# Patient Record
Sex: Male | Born: 1960
Health system: Southern US, Community
[De-identification: ages and names within clinical notes are randomized; demographics above are authoritative.]

## PROBLEM LIST (undated history)

## (undated) DIAGNOSIS — I10 Essential (primary) hypertension: Secondary | ICD-10-CM

## (undated) DIAGNOSIS — G8929 Other chronic pain: Secondary | ICD-10-CM

## (undated) DIAGNOSIS — M51369 Other intervertebral disc degeneration, lumbar region without mention of lumbar back pain or lower extremity pain: Secondary | ICD-10-CM

## (undated) DIAGNOSIS — Z923 Personal history of irradiation: Secondary | ICD-10-CM

## (undated) DIAGNOSIS — C801 Malignant (primary) neoplasm, unspecified: Secondary | ICD-10-CM

## (undated) DIAGNOSIS — K219 Gastro-esophageal reflux disease without esophagitis: Secondary | ICD-10-CM

## (undated) DIAGNOSIS — A0472 Enterocolitis due to Clostridium difficile, not specified as recurrent: Secondary | ICD-10-CM

## (undated) DIAGNOSIS — R112 Nausea with vomiting, unspecified: Secondary | ICD-10-CM

## (undated) DIAGNOSIS — K2270 Barrett's esophagus without dysplasia: Secondary | ICD-10-CM

## (undated) DIAGNOSIS — K5792 Diverticulitis of intestine, part unspecified, without perforation or abscess without bleeding: Secondary | ICD-10-CM

## (undated) DIAGNOSIS — M5136 Other intervertebral disc degeneration, lumbar region: Secondary | ICD-10-CM

## (undated) DIAGNOSIS — Z9889 Other specified postprocedural states: Secondary | ICD-10-CM

## (undated) DIAGNOSIS — M549 Dorsalgia, unspecified: Secondary | ICD-10-CM

## (undated) DIAGNOSIS — F419 Anxiety disorder, unspecified: Secondary | ICD-10-CM

## (undated) DIAGNOSIS — E785 Hyperlipidemia, unspecified: Secondary | ICD-10-CM

## (undated) DIAGNOSIS — E039 Hypothyroidism, unspecified: Secondary | ICD-10-CM

## (undated) DIAGNOSIS — Q394 Esophageal web: Secondary | ICD-10-CM

## (undated) HISTORY — DX: Hypothyroidism, unspecified: E03.9

## (undated) HISTORY — PX: COLONOSCOPY: SHX174

## (undated) HISTORY — DX: Barrett's esophagus without dysplasia: K22.70

## (undated) HISTORY — PX: OTHER SURGICAL HISTORY: SHX169

## (undated) HISTORY — DX: Essential (primary) hypertension: I10

## (undated) HISTORY — DX: Other chronic pain: G89.29

## (undated) HISTORY — DX: Enterocolitis due to Clostridium difficile, not specified as recurrent: A04.72

## (undated) HISTORY — DX: Dorsalgia, unspecified: M54.9

## (undated) HISTORY — PX: REMOVAL OF GASTROSTOMY TUBE: SHX6058

## (undated) HISTORY — DX: Malignant (primary) neoplasm, unspecified: C80.1

## (undated) HISTORY — PX: CHOLECYSTECTOMY: SHX55

## (undated) HISTORY — DX: Diverticulitis of intestine, part unspecified, without perforation or abscess without bleeding: K57.92

## (undated) HISTORY — PX: GASTROSTOMY TUBE CHANGE: SHX312

## (undated) HISTORY — DX: Esophageal web: Q39.4

---

## 1998-07-31 ENCOUNTER — Inpatient Hospital Stay (HOSPITAL_COMMUNITY): Admission: EM | Admit: 1998-07-31 | Discharge: 1998-08-01 | Payer: Self-pay | Admitting: Emergency Medicine

## 2002-05-29 ENCOUNTER — Encounter: Payer: Self-pay | Admitting: Family Medicine

## 2002-05-29 ENCOUNTER — Inpatient Hospital Stay (HOSPITAL_COMMUNITY): Admission: AD | Admit: 2002-05-29 | Discharge: 2002-05-31 | Payer: Self-pay | Admitting: Family Medicine

## 2003-05-21 ENCOUNTER — Ambulatory Visit (HOSPITAL_BASED_OUTPATIENT_CLINIC_OR_DEPARTMENT_OTHER): Admission: RE | Admit: 2003-05-21 | Discharge: 2003-05-21 | Payer: Self-pay | Admitting: *Deleted

## 2003-07-24 ENCOUNTER — Encounter: Payer: Self-pay | Admitting: Emergency Medicine

## 2003-07-24 ENCOUNTER — Observation Stay (HOSPITAL_COMMUNITY): Admission: EM | Admit: 2003-07-24 | Discharge: 2003-07-25 | Payer: Self-pay | Admitting: Emergency Medicine

## 2003-07-24 ENCOUNTER — Encounter: Payer: Self-pay | Admitting: Family Medicine

## 2004-10-14 ENCOUNTER — Ambulatory Visit (HOSPITAL_COMMUNITY): Admission: RE | Admit: 2004-10-14 | Discharge: 2004-10-14 | Payer: Self-pay | Admitting: Family Medicine

## 2005-04-25 ENCOUNTER — Ambulatory Visit (HOSPITAL_COMMUNITY): Admission: RE | Admit: 2005-04-25 | Discharge: 2005-04-25 | Payer: Self-pay | Admitting: Family Medicine

## 2006-07-12 ENCOUNTER — Ambulatory Visit (HOSPITAL_COMMUNITY): Admission: RE | Admit: 2006-07-12 | Discharge: 2006-07-12 | Payer: Self-pay | Admitting: Family Medicine

## 2007-07-04 ENCOUNTER — Ambulatory Visit (HOSPITAL_COMMUNITY): Admission: RE | Admit: 2007-07-04 | Discharge: 2007-07-04 | Payer: Self-pay | Admitting: Family Medicine

## 2007-07-10 ENCOUNTER — Ambulatory Visit: Payer: Self-pay | Admitting: Cardiovascular Disease

## 2007-07-24 ENCOUNTER — Ambulatory Visit (HOSPITAL_COMMUNITY): Admission: RE | Admit: 2007-07-24 | Discharge: 2007-07-24 | Payer: Self-pay | Admitting: Family Medicine

## 2008-03-10 ENCOUNTER — Ambulatory Visit (HOSPITAL_COMMUNITY): Admission: RE | Admit: 2008-03-10 | Discharge: 2008-03-10 | Payer: Self-pay | Admitting: Family Medicine

## 2009-04-01 ENCOUNTER — Ambulatory Visit (HOSPITAL_COMMUNITY): Admission: RE | Admit: 2009-04-01 | Discharge: 2009-04-01 | Payer: Self-pay | Admitting: Family Medicine

## 2009-08-05 ENCOUNTER — Ambulatory Visit (HOSPITAL_COMMUNITY): Admission: RE | Admit: 2009-08-05 | Discharge: 2009-08-05 | Payer: Self-pay | Admitting: Family Medicine

## 2009-08-26 ENCOUNTER — Ambulatory Visit (HOSPITAL_COMMUNITY): Admission: RE | Admit: 2009-08-26 | Discharge: 2009-08-26 | Payer: Self-pay | Admitting: Family Medicine

## 2010-11-04 ENCOUNTER — Ambulatory Visit (HOSPITAL_COMMUNITY): Admission: RE | Admit: 2010-11-04 | Discharge: 2010-11-04 | Payer: Self-pay | Admitting: Family Medicine

## 2011-01-16 ENCOUNTER — Encounter: Payer: Self-pay | Admitting: Family Medicine

## 2011-05-10 NOTE — Assessment & Plan Note (Signed)
[facility] HEALTHCARE                       [place] CARDIOLOGY OFFICE NOTE   NAME:[name] Best, [name] Best                      MRN:          [id number]  DATE:07/10/2007                            DOB:          1961/10/21    [name] is a delightful 50 year old patient referred for chest pain.  The patient had an episode two weeks ago.  He woke up in the morning.  He had severe substernal chest pain.  It hurt to take a deep breath.  He  then had some burning in his throat.  The pain radiated to his back and  lasted about two hours and resolved spontaneously.  He did not seek  medical advice at this time.  Two days later he saw Dr. Regino Schultze, and Dr.  Regino Schultze referred the patient here.  The patient thinks he may have had a  similar episode in 2004 related to reflux.  He had no workup at that  time.  The patient does admit to eating some steaks at about 9 o'clock  at night prior to the episode.   The pain has not recurred.  There was no associated diaphoresis or  syncope and no palpitations.   Coronary risk factors essentially include positive family history.  The  patient is a nonsmoker.  He does not have significant hypertension.  He  does not have diabetes.  Cholesterol status is unknown.   REVIEW OF SYSTEMS:  His Review of Systems is, otherwise, negative.   HOSPITALIZATIONS:  He does not have any recent hospitalizations.   ALLERGIES:  He reports having an allergy to VALIUM.   SOCIAL HISTORY:  He does not smoke or drink.   PAST SURGICAL HISTORY:  His only previous surgery involves the left  elbow done by Dr. Metro Kung for pinched nerve.   Meds: He does not take any medications routinely and takes p.r.n.  antacids.   FAMILY HISTORY:  Remarkable for father dying at age 14 of heart attack.  Mother is still alive at age 32.  The patient is happily married.  He  has an 52 year old and a 52 year old.  He works at [employer].  He tries to walk at [employer] every other day.  Otherwise, he is fairly  sedentary and has always been heavy.   PHYSICAL EXAMINATION:  GENERAL:  Remarkable for a healthy-appearing but  overweight middle-aged white male in no distress.  Affect is  appropriate.  VITAL SIGNS:  Weight 262, blood pressure 138/80, pulse 72 and regular.  HEENT:  Normal.  NECK:  Supple.  There is no thyromegaly, no lymphadenopathy.  No bruits,  no JVP elevation.  LUNGS:  Clear with good diaphragmatic motion and no wheezing.  HEART:  There is an S1, S2 with normal heart sounds.  PMI is normal.  ABDOMEN:  Benign.  Bowel sounds are positive.  No AAA, no tenderness, no  hepatosplenomegaly or hepatojugular reflux.  EXTREMITIES:  Femorals are +4 bilaterally without bruits.  Pulses are  intact with no edema.  NEUROLOGICAL:  Nonfocal.  There is no muscular weakness.  SKIN:  Warm and dry.  He has a scarf  over the left elbow from his  previous surgery.   Electrocardiogram shows sinus rhythm with T wave changes in leads III  and aVF and right superior axis deviation.   IMPRESSION:  1. Isolated episode of chest pain may be related to reflux not      recurrent.  Agree with stress Myoview testing.  2. Abnormal EKG with T wave inversions in III and aVF likely      nonspecific.  The patient having a stress test to rule out coronary      disease.  Given the fact that he has an abnormal EKG and there is      also a question of pulmonary disease pattern with right superior      axis deviation, I think he should have a 2D echocardiogram to      assess the structural integrity of his right and left ventricle.  3. Cholesterol status unknown, likely elevated.  Needs to see a      nutritionist for dietary consultation and weight loss.  4. History of reflux p.r.n. Protonix.  5. I will see the patient on a p.r.n. basis depending on the results      of a stress test and echocardiogram.     Theron Arista C. Eden Emms, MD, [facility]  Electronically Signed     PCN/MedQ  DD: 07/10/2007  DT: 07/11/2007  Job #: [id number]   cc:   Kirk Ruths, M.D.

## 2011-05-13 NOTE — H&P (Signed)
   NAMEJACOBO, [name] Best                         ACCOUNT NO.:  [id number]   MEDICAL RECORD NO.:  [id number]                   PATIENT TYPE:  INP   LOCATION:  A330                                 FACILITY:  [facility]   PHYSICIAN:  Kirk Ruths, M.D.            DATE OF BIRTH:  13-Apr-1961   DATE OF ADMISSION:  07/23/2003  DATE OF DISCHARGE:                                HISTORY & PHYSICAL   CHIEF COMPLAINT:  Back pain.   PRESENTING ILLNESS:  This is a 50 year old white male in good health who was  at home, without problems, until he bent over to pick up his son, felt a pop  in his back and fell to the floor in such severe pain that he was unable to  move. The ambulance brought him to the emergency room where he finally got  relief after multiple injections of morphine.  The patient plain films of  his LS spine showed no acute changes.  He will be admitted for pain control,  MRI, and rule out ruptured disk.   ALLERGIES:  He is allergic to TETRACYCLINE.   PAST MEDICAL HISTORY:  Otherwise has no medical problems.   REVIEW OF SYSTEMS:  Noncontributory.   PHYSICAL EXAMINATION:  GENERAL: A well-developed, white male in severe pain.  VITAL SIGNS:  He is afebrile.  Pulse 66 and regular. Respirations 20 and  unlabored.  Blood pressure 150/80.  HEENT:  TMs are normal.  Pupils are equal, round, and reacted to light and  accommodation.  Oropharynx benign.  NECK:  Supple without JVD, bruit or thyromegaly.  LUNGS:  Lungs are clear in all areas.  HEART:  With a regular sinus rhythm without murmur, gallop, or rub.  ABDOMEN:  Soft, nontender.  Lumbar spine exquisitely tender.  Straight leg  raising negative.  NEUROLOGIC: Examination grossly intact.  EXTREMITIES:  Without clubbing, cyanosis, or edema.                                               Kirk Ruths, M.D.    WMM/MEDQ  D:  07/24/2003  T:  07/24/2003  Job:  [id number]

## 2011-05-13 NOTE — Consult Note (Signed)
[facility]  Patient:    [name] Best, NESTLE Visit Number: [id number] MRN: [id number]          Service Type: MED Location: 3A A304 01 Attending Physician:  Kirk Ruths Dictated by:   Barbaraann Barthel, M.D. Proc. Date: 05/29/02 Admit Date:  05/29/2002   CC:         Karleen Hampshire, M.D.   Consultation Report  Surgery was asked this 50 year old white male for abdominal pain.  CHIEF COMPLAINT:  Hypogastric pain for approximately 24 hours.  HISTORY OF PRESENT ILLNESS:  The patient states that he was in his usual state of good health.  He works the night shift at [employer] and developed pain at approximately 11:00 at night.  He ate about 8:00 and about 11:00 he developed some severe hypogastric pain in the midline just above the symphysis pubis. This was accompanied with some loose stools that he noted later on afterwards and he had no symptoms of nausea or vomiting.  He had discomfort when he urinated, but no blood in his urine that was grossly visible and he has no past history of nephrolithiasis.  PHYSICAL EXAMINATION  VITAL SIGNS:  Temperature 98.7.  He stated earlier that his temperature was 100 when he was seen in Dr. Loyal Gambler office.  His temperature now is 98.7 with a heart rate of 78 per minute, respirations 20 per minute, blood pressure 130/84.  Height 6 feet 3 inches, weight 275.4 pounds.  HEENT:  Head is normocephalic.  Eyes:  Extraocular movements are intact. Pupils are round and react to light and accommodation.  There is no conjunctive pallor or scleral injection.  There sclerae are of normal tincture. The patient uses corrective lenses.  Ears:  No discharge, complaint of pain, or loss of hearing.  Nose and oral mucosa within normal limits.  NECK:  Supple and cylindrical without jugular venous distention, thyromegaly, tracheal deviation, or cervical adenopathy.  No bruits are auscultated.  CHEST:  Clear both to anterior and  posterior auscultation.  HEART:  Regular rhythm.  No murmurs or gallops are appreciated.  ABDOMEN:  The patients bowel sounds are present, slightly diminished.  The patient has no visceromegaly.  He is rather large and a difficult abdomen to evaluate; however, his pain is located just above the symphysis pubis and slightly perhaps to the right of this.  He has no femoral or inguinal hernias.   GENITOURINARY:  Within normal limits.  RECTAL:  Prostate is not enlarged and smooth and nontender.  I feel firm formed stools within his rectal ampulla despite his history of diarrhea.  No masses are palpated.  He has no costovertebral tenderness on percussion.  EXTREMITIES:  Within normal limits.  PAST MEDICAL HISTORY:  Unremarkable.  PAST SURGICAL HISTORY:  The patient had a laceration of his left hand.  Other than that, no past surgeries.  MEDICATIONS:  He takes no medications on a regular basis.  ALLERGIES:  No known allergies.  REVIEW OF SYSTEMS:  GASTROINTESTINAL:  Crampy hypogastric pain that has become continuous according to him.  He did have, he states, some chills and fever earlier.  He does not have any symptoms at present.  Some loose stools.  He has no history of nausea or vomiting.  No history of weight loss.  No history of change in bowel habits.  No family history of inflammatory bowel disease. No history of bright red rectal bleeding or black tarry stools. GENITOURINARY:  No history of nephrolithiasis.  No history of  CV tenderness. No history of hematuria.  ENDOCRINE:  No history of diabetes or thyroid disease.  CARDIOVASCULAR:  The patient is a nonsmoker, nondrinker.  No history of chest pain or any history of respiratory complaints.  MUSCULOSKELETAL:  The patient is rather large, but grossly normal for his size.  SOCIOECONOMIC:  The patient works for [employer] and has done so for many years.  LABORATORY DATA:  Pending.  Patient also has a CT scan  pending.  IMPRESSION:  My clinical impression is he may possibly have an appendicitis, although the location is somewhat more to the midline than usual.  He does have some pain on deep palpation of the right lower quadrant and certainly elicits some guarding there and signs of peritoneal irritation.  He may have some colitis or enteritis.  We will evaluate that as well with a CT scan.  PLAN:  He is admitted.  He is to be made n.p.o.  He is to be hydrated.  We will check the CT scan and if this is acute appendicitis, we will perform laparotomy and appendectomy as soon as possible.  We will follow him also for any acute surgical changes.  I would like to thank Dr. Regino Schultze for sending him my way. Dictated by:   Barbaraann Barthel, M.D. Attending Physician:  Kirk Ruths DD:  05/29/02 TD:  05/31/02 Job: [id number]

## 2011-05-13 NOTE — Discharge Summary (Signed)
[facility]  Patient:    [name] Best, [name] Best Visit Number: [id number] MRN: [id number]          Service Type: MED Location: 3A A304 01 Attending Physician:  Kirk Ruths Dictated by:   Karleen Hampshire, M.D. Admit Date:  05/29/2002 Discharge Date: 05/31/2002                             Discharge Summary  DISCHARGE DIAGNOSIS:  Acute diverticulitis.  HISTORY OF PRESENT ILLNESS:  This 50 year old white male presented to my office with severe lower abdominal pain x 24 hours.  He was noted to have exquisite tenderness, suprapubic and left lower quadrant.  The patient was promptly admitted and a stat CT of the abdomen was ordered.  The patient was seen in consultation by Barbaraann Barthel, M.D., for surgery.  LABORATORY DATA:  His initial white count was 16,000 and hemoglobin 14.4.  The sedimentation rate was 48.  Electrolytes were within normal range.  The urinalysis was normal.  The urine culture was negative.  CT scan showed multiple sigmoid colon diverticula, wall thickening involving the mesenteric side of the mid sigmoid colon with adjacent soft tissue stranding, and a tiny amount of adjacent extraluminal air.  The conclusion was sigmoid diverticulosis and diverticulitis with a small contained perforation without abscess.  HOSPITAL COURSE:  The patient was placed on IV Cipro and Flagyl and sips of clear liquids.  His pain subsided over the next several days.  He underwent a nutritional consult regarding diverticular disease.  His abdomen was soft.  He was tolerating a regular diet.  DISPOSITION:  He was discharged home.  DISCHARGE MEDICATIONS:  Cipro and Flagyl.  DIET:  High-residue diet.  FOLLOW-UP:  He will be followed in eight weeks, at which time we will proceed with repeat CT.  CONDITION ON DISCHARGE:  He was stable at the time of discharge. Dictated by:   Karleen Hampshire, M.D. Attending Physician:  Kirk Ruths DD:   06/17/02 TD:  06/18/02 Job: [id number]

## 2011-05-13 NOTE — H&P (Signed)
[facility]  Patient:    [name] Best, [name] Best Visit Number: [id number] MRN: [id number]          Service Type: MED Location: 3A A304 01 Attending Physician:  Kirk Ruths Dictated by:   Karleen Hampshire, M.D. Admit Date:  05/29/2002                           History and Physical  CHIEF COMPLAINT:  Abdominal pain.  HISTORY OF PRESENT ILLNESS:  This is a 50 year old white male with no significant medical problems, who presents to the office with a one day history of abdominal pain.  The patient stated that he had some diarrhea approximately 24 hours ago x1, and had gradual onset of lower abdominal pain. Last night, he had some pain with urination, as well as sweats and chills.  He is also complaining of headache and dizziness.  The patient based on his office exam appears to have a possible acute abdomen.  Will admit.  ALLERGIES:  TETRACYCLINE.  MEDICATIONS:  Currently takes Xanax p.r.n.  PAST MEDICAL HISTORY:  The patient is status post T&A.  REVIEW OF SYSTEMS:  Noncontributory, see PI.  SOCIAL HISTORY:  The patient denies smoking, alcohol, or drug use.  PHYSICAL EXAMINATION:  GENERAL:  A well-developed white male who appears in moderate distress due to abdominal pain.  VITAL SIGNS:  His temperature is 99, blood pressure 130/86, pulse 98 and regular, and respirations and unlabored.  HEENT:  TMs are normal.  Pupils equal to light and accommodation.  Oropharynx benign.  NECK:  Supple without JVD, bruits, or thyromegaly.  LUNGS:  Clear in all areas.  HEART:  With a regular sinus rhythm without murmur, gallop, or rub.  ABDOMEN:  Soft with positive bowel sounds, although diminished.  There is severe suprapubic tenderness with rebound.  RECTAL:  Prostate exam is normal and nontender.  EXTREMITIES:  Without clubbing, cyanosis, or edema.  NEUROLOGIC:  Examination grossly intact.  ASSESSMENT: 1. Acute abdomen, probable diverticulitis. 2.  Possible urinary tract infection.  PLAN:  Will admit, IV antibiotics, surgical consult, stat CT of the abdomen and pelvis. Dictated by:   Karleen Hampshire, M.D. Attending Physician:  Kirk Ruths DD:  05/29/02 TD:  05/30/02 Job: [id number]

## 2011-05-13 NOTE — Op Note (Signed)
NAME:  [name], Best                         ACCOUNT NO.:  [id number]   MEDICAL RECORD NO.:  [id number]                   PATIENT TYPE:  AMB   LOCATION:  DSC                                  FACILITY:  [facility]   PHYSICIAN:  Lowell Bouton, M.D.      DATE OF BIRTH:  10-08-1961   DATE OF PROCEDURE:  DATE OF DISCHARGE:                                 OPERATIVE REPORT   PREOPERATIVE DIAGNOSIS:  Ulnar nerve compression left elbow.   POSTOPERATIVE DIAGNOSIS:  Ulnar nerve compression left elbow.   PROCEDURE:  Subcutaneous anterior transposition ulnar nerve, left elbow.   SURGEON:  Lowell Bouton, M.D.   ANESTHESIA:  General.   FINDINGS:  The patient had a very proximal takeoff of the FCU branch.  The  nerve did not show specific area of compression, however, there was an  unusual plexus of veins overlying the nerve just proximal to the takeoff of  the FCU.   PROCEDURE IN DETAIL:  Under general anesthesia with the tourniquet on the  left arm the left arm was prepped and draped in the usual fashion and after  exsanguinating the limb the tourniquet was inflated to 250 mmHg.  A curved  incision was made in line with the ulnar nerve at the elbow.  Sharp  dissection was carried through the subcutaneous tissues and bleeding points  were coagulated.  Weitlander retractors were inserted and blunt dissection  was carried down to the ulnar nerve.  The nerve was traced from the medial  epicondyle proximally about 8 cm and released of any fascial bands.  The  nerve roots then traced from the medial epicondyle distally and there was a  leash of veins overlying the nerve.  These were carefully coagulated and  divided.  The nerve was then traced distally down into the FCU fascia and  released.  The takeoff to the FCU muscle was slightly proximal and care was  taken to protect it.  The nerve was released circumferentially and then  knife blade was used to elevate the soft tissue  off of the muscle of the  flexor forearm mass.  The nerve was then transposed anteriorly and was  positioned over the flexor muscle mass.  Flexion extension of the elbow  showed good tracking.  The tissues of the medial epicondyle were closed to  the subcutaneous tissues on the anterior flap and there was a significant  amount of room for the nerve to slide back and forth in its new position.  The wound was then irrigated copiously with saline, 4-0 Vicryl was used to  bring the flap down to the medial epicondyle.  It was then used to close the  subcutaneous layer and 3-0 Prolene was used in a subcuticular fashion in the  skin.  Steri-Strips were applied.  A vessel loop drain was left in for  drainage.  Sterile dressings were applied followed by a posterior elbow splint.  The  tourniquet  was released with good circulation to the hand.  Marcaine 0.5%  was inserted at the skin edges for pain control.  The patient went to the  recovery room awake and stable in good condition.                                               Lowell Bouton, M.D.    EMM/MEDQ  D:  05/21/2003  T:  05/21/2003  Job:  [id number]   cc:   Patrica Duel, M.D.  [address]  [place]  [place] [address]  Fax: [phone]

## 2011-07-29 ENCOUNTER — Encounter (INDEPENDENT_AMBULATORY_CARE_PROVIDER_SITE_OTHER): Payer: Self-pay | Admitting: *Deleted

## 2011-08-02 ENCOUNTER — Encounter (INDEPENDENT_AMBULATORY_CARE_PROVIDER_SITE_OTHER): Payer: Self-pay

## 2011-08-16 ENCOUNTER — Ambulatory Visit (INDEPENDENT_AMBULATORY_CARE_PROVIDER_SITE_OTHER): Payer: 59 | Admitting: Orthopedic Surgery

## 2011-08-16 ENCOUNTER — Encounter: Payer: Self-pay | Admitting: Orthopedic Surgery

## 2011-08-16 DIAGNOSIS — M2242 Chondromalacia patellae, left knee: Secondary | ICD-10-CM | POA: Insufficient documentation

## 2011-08-16 DIAGNOSIS — M2240 Chondromalacia patellae, unspecified knee: Secondary | ICD-10-CM

## 2011-08-16 NOTE — Patient Instructions (Signed)
You have received a steroid shot. 15% of patients experience increased pain at the injection site with in the next 24 hours. This is best treated with ice and tylenol extra strength 2 tabs every 8 hours. If you are still having pain please call the office.    Start Home exercises , do for 6 weeks

## 2011-08-16 NOTE — Progress Notes (Signed)
Chief complaint: left knee pain and locking HPI:(4) 49 yo male pain since Jan 2012, gradual in onset, no treatment and no injury. Describes as stabbing, 8/10 constant. Associated with standing, walking, numbness, tingling, locking and catching. The patient does recall falling onto both knees landing on his kneecaps about 6 months ago.  All of his symptoms are referred to the knee.  He does have some medial joint line pain as well.  ROS:(2) Seasonal ALLERGIES and heartburn or complaints.  Also complains of lumbar disc disease currently on prednisone  PFSH: (1)  Past Surgical History  Procedure Date  . Gallbladder surgery   . Left elbow     Past Medical History  Diagnosis Date  . Diverticulitis   . Stomach pain   . Abdominal pain, left lower quadrant   . Sprain and strain of unspecified site of knee and leg   . Chronic back pain       Physical Exam(12) GENERAL: normal development   CDV: pulses are normal   Skin: normal  Lymph: nodes were not palpable/normal  Psychiatric: awake, alert and oriented  Neuro: normal sensation  MSK LEFT knee  The patella including tenderness on the medial facet, no joint effusion.  Lyme and normal.  Range of motion normal.  Strength normal.  Knee stable.  McMurray sign negative.  Quadriceps contraction test positive.  Inferior pole patellar tenderness in the tendon.  RIGHT knee range of motion strength and stability alignment normal  Imaging: x-rays of the left knee in the office:   Assessment: AKPS    Plan: inject knee f/u prn   HEP   .xray report: 3 views LEFT knee Anterior knee pain Findings normal knee Impression normal knee   Inject Knee Knee  Injection Procedure Note  Pre-operative Diagnosis: left knee oa  Post-operative Diagnosis: same  Indications: pain  Anesthesia: ethyl chloride   Procedure Details   Verbal consent was obtained for the procedure. Time out was completed.The joint was prepped with alcohol, followed  by  Ethyl chloride spray and A 20 gauge needle was inserted into the knee via lateral approach; 4ml 1% lidocaine and 1 ml of depomedrol  was then injected into the joint . The needle was removed and the area cleansed and dressed.  Complications:  None; patient tolerated the procedure well.

## 2011-09-05 ENCOUNTER — Ambulatory Visit (INDEPENDENT_AMBULATORY_CARE_PROVIDER_SITE_OTHER): Payer: Self-pay | Admitting: Internal Medicine

## 2011-11-02 ENCOUNTER — Other Ambulatory Visit (HOSPITAL_COMMUNITY): Payer: Self-pay | Admitting: Internal Medicine

## 2011-11-02 DIAGNOSIS — K5732 Diverticulitis of large intestine without perforation or abscess without bleeding: Secondary | ICD-10-CM

## 2011-11-03 ENCOUNTER — Encounter (HOSPITAL_COMMUNITY): Payer: Self-pay

## 2011-11-03 ENCOUNTER — Ambulatory Visit (HOSPITAL_COMMUNITY)
Admission: RE | Admit: 2011-11-03 | Discharge: 2011-11-03 | Disposition: A | Discharge status: Home / Self Care | Payer: 59 | Source: Ambulatory Visit | Attending: Internal Medicine | Admitting: Internal Medicine

## 2011-11-03 DIAGNOSIS — R109 Unspecified abdominal pain: Secondary | ICD-10-CM | POA: Insufficient documentation

## 2011-11-03 DIAGNOSIS — K5732 Diverticulitis of large intestine without perforation or abscess without bleeding: Secondary | ICD-10-CM | POA: Insufficient documentation

## 2011-11-03 MED ORDER — IOHEXOL 300 MG/ML  SOLN
100.0000 mL | Freq: Once | INTRAMUSCULAR | Status: AC | PRN
  Administered 2011-11-03: 100 mL via INTRAVENOUS

## 2012-05-04 ENCOUNTER — Ambulatory Visit (HOSPITAL_COMMUNITY)
Admission: RE | Admit: 2012-05-04 | Discharge: 2012-05-04 | Disposition: A | Discharge status: Home / Self Care | Payer: 59 | Source: Ambulatory Visit | Attending: Family Medicine | Admitting: Family Medicine

## 2012-05-04 ENCOUNTER — Other Ambulatory Visit (HOSPITAL_COMMUNITY): Payer: Self-pay | Admitting: Family Medicine

## 2012-05-04 DIAGNOSIS — R079 Chest pain, unspecified: Secondary | ICD-10-CM

## 2012-05-04 DIAGNOSIS — R05 Cough: Secondary | ICD-10-CM | POA: Insufficient documentation

## 2012-05-04 DIAGNOSIS — R059 Cough, unspecified: Secondary | ICD-10-CM | POA: Insufficient documentation

## 2012-08-15 ENCOUNTER — Encounter: Payer: Self-pay | Admitting: Orthopedic Surgery

## 2012-08-15 ENCOUNTER — Ambulatory Visit: Payer: 59 | Admitting: Orthopedic Surgery

## 2012-12-26 DIAGNOSIS — A0472 Enterocolitis due to Clostridium difficile, not specified as recurrent: Secondary | ICD-10-CM

## 2012-12-26 HISTORY — DX: Enterocolitis due to Clostridium difficile, not specified as recurrent: A04.72

## 2013-04-16 NOTE — H&P (Signed)
  NTS SOAP Note  Vital Signs:  Vitals as of: 04/16/2013: Systolic 152: Diastolic 98: Heart Rate 75: Temp 97.28F: Height 70ft 3in: Weight 278Lbs 0 Ounces: BMI 34.75  BMI : 34.75 kg/m2  Subjective: This 8 Years 45 Months old Male presents for screening TCS.  Last had a colonoscopy over ten years ago.  Denies any recent GI problems.  Has had diverticulosis in the past.  No family h/o colon cancer.   Review of Symptoms:  Constitutional:unremarkable   Head:unremarkable    Eyes:unremarkable   Nose/Mouth/Throat:unremarkable Cardiovascular:  unremarkable   Respiratory:unremarkable   Gastrointestinal:  unremarkable   Genitourinary:unremarkable     Musculoskeletal:unremarkable   Skin:unremarkable Hematolgic/Lymphatic:unremarkable     Allergic/Immunologic:unremarkable     Past Medical History:    Reviewed   Past Medical History  Surgical History: cholecystectomy Medical Problems:  High cholesterol Allergies: nkda Medications: pravstatin, valium   Social History:Reviewed  Social History  Preferred Language: English Race:  White Ethnicity: Not Hispanic / Latino Age: 52 Years 0 Months Marital Status:  M Alcohol:  No Recreational drug(s):  No   Smoking Status: Never smoker reviewed on 04/16/2013 Functional Status reviewed on mm/dd/yyyy ------------------------------------------------ Bathing: Normal Cooking: Normal Dressing: Normal Driving: Normal Eating: Normal Managing Meds: Normal Oral Care: Normal Shopping: Normal Toileting: Normal Transferring: Normal Walking: Normal Cognitive Status reviewed on mm/dd/yyyy ------------------------------------------------ Attention: Normal Decision Making: Normal Language: Normal Memory: Normal Motor: Normal Perception: Normal Problem Solving: Normal Visual and Spatial: Normal   Family History:  Reviewed   Family History  Is there a family history of:No family h/o colon  cancer    Objective Information: General:  Well appearing, well nourished in no distress. Heart:  RRR, no murmur Lungs:    CTA bilaterally, no wheezes, rhonchi, rales.  Breathing unlabored. Abdomen:Soft, NT/ND, no HSM, no masses.   deferred to procedure  Assessment:Need for screening TCS  Diagnosis &amp; Procedure Smart Code   Plan:Scheduled for TCS on 05/21/13.   Patient Education:Alternative treatments to surgery were discussed with patient (and family).  Risks and benefits  of procedure were fully explained to the patient (and family) who gave informed consent. Patient/family questions were addressed.  Follow-up:Pending Surgery

## 2013-05-27 ENCOUNTER — Other Ambulatory Visit (HOSPITAL_COMMUNITY): Payer: Self-pay | Admitting: Internal Medicine

## 2013-05-27 DIAGNOSIS — K5732 Diverticulitis of large intestine without perforation or abscess without bleeding: Secondary | ICD-10-CM

## 2013-05-28 ENCOUNTER — Ambulatory Visit (HOSPITAL_COMMUNITY)
Admission: RE | Admit: 2013-05-28 | Discharge: 2013-05-28 | Disposition: A | Discharge status: Home / Self Care | Payer: 59 | Source: Ambulatory Visit | Attending: Internal Medicine | Admitting: Internal Medicine

## 2013-05-28 DIAGNOSIS — R197 Diarrhea, unspecified: Secondary | ICD-10-CM | POA: Insufficient documentation

## 2013-05-28 DIAGNOSIS — K5730 Diverticulosis of large intestine without perforation or abscess without bleeding: Secondary | ICD-10-CM | POA: Insufficient documentation

## 2013-05-28 DIAGNOSIS — K5732 Diverticulitis of large intestine without perforation or abscess without bleeding: Secondary | ICD-10-CM

## 2013-05-28 DIAGNOSIS — R1032 Left lower quadrant pain: Secondary | ICD-10-CM | POA: Insufficient documentation

## 2013-05-28 MED ORDER — IOHEXOL 300 MG/ML  SOLN
100.0000 mL | Freq: Once | INTRAMUSCULAR | Status: AC | PRN
  Administered 2013-05-28: 100 mL via INTRAVENOUS

## 2013-06-06 NOTE — H&P (Signed)
NTS SOAP Note  Vital Signs:  Vitals as of: 06/06/2013: Systolic 152: Diastolic 98: Heart Rate 75: Temp 97.19F: Height 35ft 3in: Weight 278Lbs 0 Ounces: BMI 34.75  BMI : 34.75 kg/m2  Subjective: This 52 Years 69 Months old Male presents for screening TCS.  Last had a colonoscopy over ten years ago.  Denies any recent GI problems.  Has had diverticulosis in the past.  No family h/o colon cancer.   Review of Symptoms:  Constitutional:unremarkable   Head:unremarkable    Eyes:unremarkable   Nose/Mouth/Throat:unremarkable Cardiovascular:  unremarkable   Respiratory:unremarkable   Gastrointestinal:  unremarkable   Genitourinary:unremarkable     Musculoskeletal:unremarkable   Skin:unremarkable Hematolgic/Lymphatic:unremarkable     Allergic/Immunologic:unremarkable     Past Medical History:    Reviewed   Past Medical History  Surgical History: cholecystectomy Medical Problems:  High cholesterol Allergies: nkda Medications: pravstatin, valium   Social History:Reviewed  Social History  Preferred Language: English Race:  White Ethnicity: Not Hispanic / Latino Age: 52 Years 0 Months Marital Status:  M Alcohol:  No Recreational drug(s):  No   Smoking Status: Never smoker reviewed on 04/16/2013 Functional Status reviewed on mm/dd/yyyy ------------------------------------------------ Bathing: Normal Cooking: Normal Dressing: Normal Driving: Normal Eating: Normal Managing Meds: Normal Oral Care: Normal Shopping: Normal Toileting: Normal Transferring: Normal Walking: Normal Cognitive Status reviewed on mm/dd/yyyy ------------------------------------------------ Attention: Normal Decision Making: Normal Language: Normal Memory: Normal Motor: Normal Perception: Normal Problem Solving: Normal Visual and Spatial: Normal   Family History:  Reviewed   Family History  Is there a family history of:No family h/o colon  cancer    Objective Information: General:  Well appearing, well nourished in no distress. Heart:  RRR, no murmur Lungs:    CTA bilaterally, no wheezes, rhonchi, rales.  Breathing unlabored. Abdomen:Soft, NT/ND, no HSM, no masses.   deferred to procedure  Assessment:Need for screening TCS, epigastric pain  Diagnosis &amp; Procedure Smart Code   Plan:Scheduled for EGD, TCS on 07/02/13.   Patient Education:Alternative treatments to surgery were discussed with patient (and family).  Risks and benefits  of procedure were fully explained to the patient (and family) who gave informed consent. Patient/family questions were addressed.  Follow-up:Pending Surgery

## 2013-06-17 ENCOUNTER — Encounter (HOSPITAL_COMMUNITY): Payer: Self-pay | Admitting: Pharmacy Technician

## 2013-06-18 NOTE — H&P (Signed)
  Post Operative Follow Up - 06/18/2013  Patient Name: [name] Best Date of Birth: 1961/05/24  Vital Signs:  Vitals as of: 05/30/2013: Systolic 157: Diastolic 101: Heart Rate 68: Temp 36.5C: Height 190CM: Weight 126.55KG: BMI 34.87   BMI: 34.87 kg/m2  Subjective: This 49 Years 1 Months old Male presents for followup.  Had an episode of lower abdominal pain last week, went to ER.  CT scan of abdomen showed sigmoid diverticulosis, but no diverticulitis.  Pain has resolved.  Has had intermittent episodes of lower abdominal pain and bowel urgency for many years.  Also has intermittent reflux. Patient     significant complaints.   Social History: Reviewed  Social History  Preferred Language: English Race:  White Ethnicity: Not Hispanic / Latino Age: 88 Years 0 Months Marital Status:  M Alcohol:  No Recreational drug(s):  No       Allergies:  Allergies Insert Code: No allergies found.     Objective: General: Well appearing, well nourished in no distress. Lungs:  CTA Cor:  RRR without s3, s4, murmurs Abdomen:  soft, nontender, nondistended.  No HSM, masses, hernias.   Assessment: Reflux disease, sigmoid diverticulosis, Abdominal pain  Diagnosis &amp; Procedure Smart Code    Plan: No need for acute surgical intervention.  Will add EGD to already scheduled TCS oin 07/02/13.   Follow-up:Pending Surgery

## 2013-07-02 ENCOUNTER — Encounter (HOSPITAL_COMMUNITY): Payer: Self-pay | Admitting: *Deleted

## 2013-07-02 ENCOUNTER — Ambulatory Visit (HOSPITAL_COMMUNITY)
Admission: RE | Admit: 2013-07-02 | Discharge: 2013-07-02 | Disposition: A | Discharge status: Home / Self Care | Payer: 59 | Source: Ambulatory Visit | Attending: General Surgery | Admitting: General Surgery

## 2013-07-02 ENCOUNTER — Encounter (HOSPITAL_COMMUNITY)
Admission: RE | Disposition: A | Discharge status: Home / Self Care | Payer: Self-pay | Source: Ambulatory Visit | Attending: General Surgery

## 2013-07-02 DIAGNOSIS — R109 Unspecified abdominal pain: Secondary | ICD-10-CM | POA: Insufficient documentation

## 2013-07-02 DIAGNOSIS — K5730 Diverticulosis of large intestine without perforation or abscess without bleeding: Secondary | ICD-10-CM | POA: Insufficient documentation

## 2013-07-02 DIAGNOSIS — K449 Diaphragmatic hernia without obstruction or gangrene: Secondary | ICD-10-CM | POA: Insufficient documentation

## 2013-07-02 DIAGNOSIS — D126 Benign neoplasm of colon, unspecified: Secondary | ICD-10-CM | POA: Insufficient documentation

## 2013-07-02 DIAGNOSIS — R1310 Dysphagia, unspecified: Secondary | ICD-10-CM | POA: Insufficient documentation

## 2013-07-02 DIAGNOSIS — K2940 Chronic atrophic gastritis without bleeding: Secondary | ICD-10-CM | POA: Insufficient documentation

## 2013-07-02 HISTORY — DX: Hyperlipidemia, unspecified: E78.5

## 2013-07-02 HISTORY — PX: COLONOSCOPY: SHX5424

## 2013-07-02 HISTORY — DX: Other specified postprocedural states: Z98.890

## 2013-07-02 HISTORY — PX: ESOPHAGOGASTRODUODENOSCOPY: SHX5428

## 2013-07-02 HISTORY — DX: Anxiety disorder, unspecified: F41.9

## 2013-07-02 HISTORY — DX: Other specified postprocedural states: R11.2

## 2013-07-02 SURGERY — COLONOSCOPY
Anesthesia: Moderate Sedation

## 2013-07-02 MED ORDER — SODIUM CHLORIDE 0.9 % IV SOLN
INTRAVENOUS | Status: DC
  Administered 2013-07-02: 08:00:00 via INTRAVENOUS

## 2013-07-02 MED ORDER — MIDAZOLAM HCL 5 MG/5ML IJ SOLN
INTRAMUSCULAR | Status: AC
  Filled 2013-07-02: qty 10

## 2013-07-02 MED ORDER — BUTAMBEN-TETRACAINE-BENZOCAINE 2-2-14 % EX AERO
INHALATION_SPRAY | CUTANEOUS | Status: DC | PRN
  Administered 2013-07-02: 2 via TOPICAL

## 2013-07-02 MED ORDER — MIDAZOLAM HCL 5 MG/5ML IJ SOLN
INTRAMUSCULAR | Status: DC | PRN
  Administered 2013-07-02: 4 mg via INTRAVENOUS
  Administered 2013-07-02 (×2): 1 mg via INTRAVENOUS

## 2013-07-02 MED ORDER — MEPERIDINE HCL 25 MG/ML IJ SOLN
INTRAMUSCULAR | Status: DC | PRN
  Administered 2013-07-02: 25 mg via INTRAVENOUS
  Administered 2013-07-02: 50 mg via INTRAVENOUS

## 2013-07-02 MED ORDER — OMEPRAZOLE 20 MG PO CPDR: 20.0000 mg | DELAYED_RELEASE_CAPSULE | Freq: Every day | ORAL | Status: DC

## 2013-07-02 MED ORDER — MEPERIDINE HCL 100 MG/ML IJ SOLN
INTRAMUSCULAR | Status: AC
  Filled 2013-07-02: qty 1

## 2013-07-02 NOTE — Interval H&P Note (Signed)
History and Physical Interval Note:  07/02/2013 8:25 AM  [name] Best  has presented today for surgery, with the diagnosis of screening, heartburn  The various methods of treatment have been discussed with the patient and family. After consideration of risks, benefits and other options for treatment, the patient has consented to  Procedure(s) with comments: COLONOSCOPY (N/A) - rescheduled @ 830 Pt notified per office ESOPHAGOGASTRODUODENOSCOPY (EGD) (N/A) as a surgical intervention .  The patient's history has been reviewed, patient examined, no change in status, stable for surgery.  I have reviewed the patient's chart and labs.  Questions were answered to the patient's satisfaction.     Franky Macho A

## 2013-07-02 NOTE — Op Note (Signed)
[facility] [address] [place], [address]   COLONOSCOPY PROCEDURE REPORT  PATIENT: [name] Best, [name] Best  MR#: [id number] BIRTHDATE: 1961/12/11 , 52  yrs. old GENDER: Male ENDOSCOPIST: Franky Macho, MD REFERRED XB:[id number], John PROCEDURE DATE:  07/02/2013 PROCEDURE:   Colonoscopy, diagnostic ASA CLASS:   Class II INDICATIONS:Abdominal pain. MEDICATIONS: Versed 6 mg IV and Demerol 75 mg IV  DESCRIPTION OF PROCEDURE:   After the risks benefits and alternatives of the procedure were thoroughly explained, informed consent was obtained.  A digital rectal exam revealed no abnormalities of the rectum.   The EC-[id number] ([id number]) and [id number] ([id number])  endoscope was introduced through the anus and advanced to the cecum, which was identified by both the appendix and ileocecal valve. No adverse events experienced.   The quality of the prep was adequate, using MoviPrep  The instrument was then slowly withdrawn as the colon was fully examined.      COLON FINDINGS: A sessile polyp ranging between 3-63mm in size with a mucous cap was found in the proximal sigmoid colon.  A polypectomy was performed using snare cautery.  The resection was complete and the polyp tissue was completely retrieved.   There was mild diverticulosis noted in the sigmoid colon with associated petechiae.  Retroflexed views revealed no abnormalities. The time to cecum=5 minutes 0 seconds.  Withdrawal time=8 minutes 0 seconds. The scope was withdrawn and the procedure completed. COMPLICATIONS: There were no complications.  ENDOSCOPIC IMPRESSION: 1.   Sessile polyp ranging between 3-48mm in size was found in the proximal sigmoid colon; polypectomy was performed using snare cautery 2.   There was mild diverticulosis noted in the sigmoid colon  RECOMMENDATIONS: 1.  Await pathology results 2.  Repeat Colonoscopy in 3 years. 3.  High fiber diet   eSigned:  Franky Macho, MD 07/02/2013 8:58  AM   cc:

## 2013-07-02 NOTE — Op Note (Signed)
[facility] [address] [place], [address]   ENDOSCOPY PROCEDURE REPORT  PATIENT: [name], Best  MR#: [id number] BIRTHDATE: [birth year] , 52  yrs. old GENDER: Male ENDOSCOPIST: Franky Macho, MD REFERRED BY:  Assunta Found PROCEDURE DATE:  07/02/2013 PROCEDURE:  EGD w/ biopsy for H.pylori ASA CLASS:     Class II INDICATIONS:  Dysphagia. MEDICATIONS: Versed 6 mg IV and Demerol 75 mg IV TOPICAL ANESTHETIC: Cetacaine Spray  DESCRIPTION OF PROCEDURE: After the risks benefits and alternatives of the procedure were thoroughly explained, informed consent was obtained.  The    endoscope was introduced through the mouth and advanced to the second portion of the duodenum. Without limitations.  The instrument was slowly withdrawn as the mucosa was fully examined.        ESOPHAGUS: The mucosa of the esophagus appeared normal.   A 4 cm hiatal hernia was noted.  STOMACH: Mild chronic gastritis (inflammation) was found in the gastric antrum.  A biopsy was performed using cold forceps.  Sample obtained for helicobacter pylori testing.  DUODENUM: The duodenal mucosa showed no abnormalities in the bulb and second portion of the duodenum.  Retroflexed views revealed a hiatal hernia.     The scope was then withdrawn from the patient and the procedure completed.  COMPLICATIONS: There were no complications. ENDOSCOPIC IMPRESSION: 1.   The mucosa of the esophagus appeared normal 2.   4 cm hiatal hernia 3.   Chronic gastritis (inflammation) was found in the gastric antrum; biopsy 4.   The duodenal mucosa showed no abnormalities in the bulb and second portion of the duodenum  RECOMMENDATIONS: Await pathology results  REPEAT EXAM:  eSigned:  Franky Macho, MD 07/02/2013 9:03 AM   CC:

## 2013-07-04 ENCOUNTER — Encounter (HOSPITAL_COMMUNITY): Payer: Self-pay | Admitting: General Surgery

## 2013-07-19 ENCOUNTER — Encounter: Payer: Self-pay | Admitting: Gastroenterology

## 2013-07-20 ENCOUNTER — Emergency Department (HOSPITAL_COMMUNITY): Payer: 59

## 2013-07-20 ENCOUNTER — Encounter (HOSPITAL_COMMUNITY): Payer: Self-pay | Admitting: *Deleted

## 2013-07-20 ENCOUNTER — Emergency Department (HOSPITAL_COMMUNITY)
Admission: EM | Admit: 2013-07-20 | Discharge: 2013-07-20 | Disposition: A | Discharge status: Home / Self Care | Payer: 59 | Attending: Emergency Medicine | Admitting: Emergency Medicine

## 2013-07-20 DIAGNOSIS — E785 Hyperlipidemia, unspecified: Secondary | ICD-10-CM | POA: Insufficient documentation

## 2013-07-20 DIAGNOSIS — G8929 Other chronic pain: Secondary | ICD-10-CM | POA: Insufficient documentation

## 2013-07-20 DIAGNOSIS — F411 Generalized anxiety disorder: Secondary | ICD-10-CM | POA: Insufficient documentation

## 2013-07-20 DIAGNOSIS — M549 Dorsalgia, unspecified: Secondary | ICD-10-CM | POA: Insufficient documentation

## 2013-07-20 DIAGNOSIS — Z9889 Other specified postprocedural states: Secondary | ICD-10-CM | POA: Insufficient documentation

## 2013-07-20 DIAGNOSIS — Z79899 Other long term (current) drug therapy: Secondary | ICD-10-CM | POA: Insufficient documentation

## 2013-07-20 DIAGNOSIS — R109 Unspecified abdominal pain: Secondary | ICD-10-CM | POA: Insufficient documentation

## 2013-07-20 DIAGNOSIS — R059 Cough, unspecified: Secondary | ICD-10-CM | POA: Insufficient documentation

## 2013-07-20 DIAGNOSIS — R197 Diarrhea, unspecified: Secondary | ICD-10-CM | POA: Insufficient documentation

## 2013-07-20 DIAGNOSIS — R110 Nausea: Secondary | ICD-10-CM | POA: Insufficient documentation

## 2013-07-20 DIAGNOSIS — K5732 Diverticulitis of large intestine without perforation or abscess without bleeding: Secondary | ICD-10-CM | POA: Insufficient documentation

## 2013-07-20 DIAGNOSIS — R05 Cough: Secondary | ICD-10-CM | POA: Insufficient documentation

## 2013-07-20 LAB — URINALYSIS, ROUTINE W REFLEX MICROSCOPIC
Bilirubin Urine: NEGATIVE
Leukocytes, UA: NEGATIVE
Nitrite: NEGATIVE
Specific Gravity, Urine: 1.015 (ref 1.005–1.030)
Urobilinogen, UA: 0.2 mg/dL (ref 0.0–1.0)
pH: 5.5 (ref 5.0–8.0)

## 2013-07-20 LAB — CBC WITH DIFFERENTIAL/PLATELET
Basophils Absolute: 0.1 10*3/uL (ref 0.0–0.1)
Eosinophils Absolute: 0.2 10*3/uL (ref 0.0–0.7)
Eosinophils Relative: 2 % (ref 0–5)
MCH: 29.1 pg (ref 26.0–34.0)
MCHC: 33.3 g/dL (ref 30.0–36.0)
MCV: 87.6 fL (ref 78.0–100.0)
Platelets: 205 10*3/uL (ref 150–400)
RDW: 13.8 % (ref 11.5–15.5)

## 2013-07-20 LAB — URINE MICROSCOPIC-ADD ON

## 2013-07-20 LAB — BASIC METABOLIC PANEL
Calcium: 9.4 mg/dL (ref 8.4–10.5)
GFR calc non Af Amer: 73 mL/min — ABNORMAL LOW (ref >90)
Glucose, Bld: 100 mg/dL — ABNORMAL HIGH (ref 70–99)
Sodium: 139 mEq/L (ref 135–145)

## 2013-07-20 MED ORDER — IOHEXOL 300 MG/ML  SOLN
50.0000 mL | Freq: Once | INTRAMUSCULAR | Status: AC | PRN
  Administered 2013-07-20: 50 mL via ORAL

## 2013-07-20 MED ORDER — IOHEXOL 300 MG/ML  SOLN
100.0000 mL | Freq: Once | INTRAMUSCULAR | Status: AC | PRN
  Administered 2013-07-20: 100 mL via INTRAVENOUS

## 2013-07-20 NOTE — ED Notes (Signed)
Pt aspirated to restroom , NAD noted

## 2013-07-20 NOTE — ED Notes (Addendum)
Pt c/o abd pain, nausea, diarrhea, pressure that started Wednesday, was seen by pcp on Thursday, given cipro and flagyl with no improvement in symptoms, has hx of diverticulitis. Pt sent to er this am from pcp office for further evaluation,

## 2013-07-20 NOTE — ED Provider Notes (Signed)
CSN: [id number]     Arrival date & time 07/20/13  9147 History  This chart was scribed for [name] Cooper III, MD by Bennett Scrape, ED Scribe. This patient was seen in room APA07/APA07 and the patient's care was started at 9:50 AM.   First MD Initiated Contact with Patient 07/20/13 [phone]     Chief Complaint  Patient presents with  . Abdominal Pain    The history is provided by the patient. No language interpreter was used.    HPI Comments: [name] Best is a 52 y.o. male who presents to the Emergency Department complaining of 3 days of LLQ abdominal pain with associated nausea attributed to diverticulitis. He admits that the trigger for this episode was "[place] food" consumed the night before the onset. He describes that pain as a waxing and waning sharp feeling that is significantly worsened by urination. He was seen by his PCP 2 days ago and started on Cipro, Flagyl and hydrocodone. He denies improvement since starting the antibiotics and pain medication and states that he has since developed diarrhea. He denies fever. He denies any prior diverticulitis surgeries. He reports that prior episodes have improved with 5 to 6 days of antibiotics. Last admission for the same was in 2008.  He has a h/o HLD which is treated by pravastatin. Pt denies smoking and alcohol use. Pt had a colonoscopy on 07/02/13 that showed a hiatal hernia and polyps.   Past Medical History  Diagnosis Date  . Diverticulitis   . Stomach pain   . Abdominal pain, left lower quadrant   . Chronic back pain   . Hyperlipidemia   . Anxiety   . PONV (postoperative nausea and vomiting)    Past Surgical History  Procedure Laterality Date  . Gallbladder surgery    . Left elbow    . Colonoscopy N/A 07/02/2013    Procedure: COLONOSCOPY;  Surgeon: Dalia Heading, MD;  Location: [facility];  Service: Gastroenterology;  Laterality: N/A;  rescheduled @ 830 Pt notified per office  . Esophagogastroduodenoscopy N/A 07/02/2013   Procedure: ESOPHAGOGASTRODUODENOSCOPY (EGD);  Surgeon: Dalia Heading, MD;  Location: [facility];  Service: Gastroenterology;  Laterality: N/A;   Family History  Problem Relation Age of Onset  . Heart disease    . Colon cancer Neg Hx   . Colon polyps Neg Hx    History  Substance Use Topics  . Smoking status: Never Smoker   . Smokeless tobacco: Not on file  . Alcohol Use: No    Review of Systems  Constitutional: Negative for fever.  HENT: Negative for ear pain and sore throat.   Eyes: Negative for visual disturbance.  Respiratory: Positive for cough. Negative for shortness of breath.   Cardiovascular: Negative for chest pain and palpitations.  Gastrointestinal: Positive for nausea, abdominal pain and diarrhea. Negative for vomiting.  Skin: Negative for rash.  Neurological: Negative for seizures and syncope.  All other systems reviewed and are negative.    Allergies  Tetracyclines & related-confirmed by pt at bedside   Home Medications   Current Outpatient Rx  Name  Route  Sig  Dispense  Refill  . ciprofloxacin (CIPRO) 500 MG tablet   Oral   Take 500 mg by mouth 2 (two) times daily.           . diazepam (VALIUM) 10 MG tablet   Oral   Take 10 mg by mouth every 6 (six) hours as needed for anxiety.          Marland Kitchen  diclofenac (VOLTAREN) 75 MG EC tablet   Oral   Take 75 mg by mouth 2 (two) times daily.         . diphenoxylate-atropine (LOMOTIL) 2.5-0.025 MG per tablet   Oral   Take 1 tablet by mouth 4 (four) times daily as needed for diarrhea or loose stools.         . metroNIDAZOLE (FLAGYL) 500 MG tablet   Oral   Take 500 mg by mouth 3 (three) times daily.           Marland Kitchen omeprazole (PRILOSEC) 20 MG capsule   Oral   Take 1 capsule (20 mg total) by mouth daily.   90 capsule   2   . pravastatin (PRAVACHOL) 20 MG tablet   Oral   Take 20 mg by mouth daily.          Triage Vitals: BP 152/88  Pulse 62  Temp(Src) 97.9 F (36.6 C)  Ht 6\' 3"  (1.905 m)  Wt  267 lb (121.11 kg)  BMI 33.37 kg/m2  SpO2 98%  Physical Exam  Nursing note and vitals reviewed. Constitutional: He is oriented to person, place, and time. He appears well-developed and well-nourished. No distress.  HENT:  Head: Normocephalic and atraumatic.  Mouth/Throat: Oropharynx is clear and moist.  Eyes: Pupils are equal, round, and reactive to light.  Neck: Neck supple. No tracheal deviation present.  Cardiovascular: Normal rate and regular rhythm.   Pulmonary/Chest: Effort normal and breath sounds normal. No respiratory distress.  Abdominal: Soft. He exhibits no distension and no mass. There is tenderness (mild left suprapubic tenderness). There is no rebound.  Musculoskeletal: Normal range of motion. He exhibits no edema.  Neurological: He is alert and oriented to person, place, and time.  Skin: Skin is warm and dry.  Psychiatric: He has a normal mood and affect. His behavior is normal.    ED Course   Procedures (including critical care time)  DIAGNOSTIC STUDIES: Oxygen Saturation is 98% on room air, normal by my interpretation.    COORDINATION OF CARE: 10:08 AM-Discussed treatment plan which includes CT of abdomen, CBC panel, BMP and UA with pt at bedside and pt agreed to plan. Pt declined pain medications.   Results for orders placed during the hospital encounter of 07/20/13  CBC WITH DIFFERENTIAL      Result Value Range   WBC 7.5  4.0 - 10.5 K/uL   RBC 5.39  4.22 - 5.81 MIL/uL   Hemoglobin 15.7  13.0 - 17.0 g/dL   HCT 40.9  81.1 - 91.4 %   MCV 87.6  78.0 - 100.0 fL   MCH 29.1  26.0 - 34.0 pg   MCHC 33.3  30.0 - 36.0 g/dL   RDW 78.2  95.6 - 21.3 %   Platelets 205  150 - 400 K/uL   Neutrophils Relative % 71  43 - 77 %   Neutro Abs 5.3  1.7 - 7.7 K/uL   Lymphocytes Relative 17  12 - 46 %   Lymphs Abs 1.3  0.7 - 4.0 K/uL   Monocytes Relative 8  3 - 12 %   Monocytes Absolute 0.6  0.1 - 1.0 K/uL   Eosinophils Relative 2  0 - 5 %   Eosinophils Absolute 0.2  0.0 -  0.7 K/uL   Basophils Relative 1  0 - 1 %   Basophils Absolute 0.1  0.0 - 0.1 K/uL  BASIC METABOLIC PANEL      Result Value Range  Sodium 139  135 - 145 mEq/L   Potassium 4.1  3.5 - 5.1 mEq/L   Chloride 106  96 - 112 mEq/L   CO2 26  19 - 32 mEq/L   Glucose, Bld 100 (*) 70 - 99 mg/dL   BUN 19  6 - 23 mg/dL   Creatinine, Ser 1.61  0.50 - 1.35 mg/dL   Calcium 9.4  8.4 - 09.6 mg/dL   GFR calc non Af Amer 73 (*) >90 mL/min   GFR calc Af Amer 84 (*) >90 mL/min  URINALYSIS, ROUTINE W REFLEX MICROSCOPIC      Result Value Range   Color, Urine YELLOW  YELLOW   APPearance CLEAR  CLEAR   Specific Gravity, Urine 1.015  1.005 - 1.030   pH 5.5  5.0 - 8.0   Glucose, UA NEGATIVE  NEGATIVE mg/dL   Hgb urine dipstick MODERATE (*) NEGATIVE   Bilirubin Urine NEGATIVE  NEGATIVE   Ketones, ur NEGATIVE  NEGATIVE mg/dL   Protein, ur NEGATIVE  NEGATIVE mg/dL   Urobilinogen, UA 0.2  0.0 - 1.0 mg/dL   Nitrite NEGATIVE  NEGATIVE   Leukocytes, UA NEGATIVE  NEGATIVE  URINE MICROSCOPIC-ADD ON      Result Value Range   Squamous Epithelial / LPF RARE  RARE   WBC, UA 3-6  <3 WBC/hpf   RBC / HPF 3-6  <3 RBC/hpf   Bacteria, UA RARE  RARE   Ct Abdomen Pelvis W Contrast  07/20/2013   *RADIOLOGY REPORT*  Clinical Data: Lower quadrant pain  CT ABDOMEN AND PELVIS WITH CONTRAST  Technique:  Multidetector CT imaging of the abdomen and pelvis was performed following the standard protocol during bolus administration of intravenous contrast.  Contrast: 50mL OMNIPAQUE IOHEXOL 300 MG/ML  SOLN, OMNIPAQUE IOHEXOL 300 MG/ML  SOLN  Comparison: 05/28/2013  Findings: Post cholecystectomy  Diffuse hepatic steatosis.  Spleen, pancreas, adrenal glands, and right kidney are within normal limits.  Simple cyst in the left kidney.  Diverticulosis of the sigmoid colon is present without evidence of acute diverticulitis.  Moderate stool burden in the mid colon.  Mild atherosclerotic changes of the aorta and iliac vasculature  Bladder is  mildly distended.  Unremarkable prostate gland  No free fluid.  No abnormal adenopathy.  Left inguinal hernia containing adipose tissue.  Mild lumbar degenerative disc disease.  No vertebral compression deformity.  IMPRESSION: No acute intra-abdominal or intrapelvic pathology.  Sigmoid diverticulosis.   Original Report Authenticated By: Jolaine Click, M.D.   Course in ED:  Pt had history obtained and physical examination performed.  Lab workup was benign, and CT of abomen/pelvis did not show diverticulitis.  His clinical Dx is diverticulitis, and he was advised to finish out his prescriptions for Cipro and Flagyl, and to followup in the office with Dr. Assunta Found, his primary care physician.   1. Abdominal  pain, other specified site   2. Diverticulitis large intestine     I personally performed the services described in this documentation, which was scribed in my presence. The recorded information has been reviewed and is accurate.  Osvaldo Human, M.D.     [name] Cooper III, MD 07/20/13 [phone]

## 2013-08-09 ENCOUNTER — Encounter: Payer: Self-pay | Admitting: Gastroenterology

## 2013-08-09 ENCOUNTER — Ambulatory Visit (INDEPENDENT_AMBULATORY_CARE_PROVIDER_SITE_OTHER): Payer: 59 | Admitting: Gastroenterology

## 2013-08-09 VITALS — BP 152/90 | HR 67 | Temp 98.4°F | Ht 75.0 in | Wt 264.8 lb

## 2013-08-09 DIAGNOSIS — K5790 Diverticulosis of intestine, part unspecified, without perforation or abscess without bleeding: Secondary | ICD-10-CM | POA: Insufficient documentation

## 2013-08-09 DIAGNOSIS — R1032 Left lower quadrant pain: Secondary | ICD-10-CM

## 2013-08-09 DIAGNOSIS — K5730 Diverticulosis of large intestine without perforation or abscess without bleeding: Secondary | ICD-10-CM

## 2013-08-09 MED ORDER — HYOSCYAMINE SULFATE ER 0.375 MG PO TB12: 0.3750 mg | ORAL_TABLET | Freq: Two times a day (BID) | ORAL | Status: DC | PRN

## 2013-08-09 NOTE — Assessment & Plan Note (Addendum)
52 year old gentleman with history of recurrent left lower quadrant abdominal pain associated with diarrhea. Initially, in 2003 with onset of symptoms, had documented diverticulitis requiring hospitalization. 6 subsequent CT scans have all been negative for acute diverticulitis. Patient has had worsening symptoms over the past 3 months. He describes taking Cipro and Flagyl 3 times with short term relief of the symptoms. Colonoscopy in July by Dr. Lovell Sheehan as outlined above. According to patient, there was no discussion about partial colectomy for recurrent diverticulitis.  Discussed with patient today. It would likely be difficult to convince a surgeon to consider partial colectomy given 6 negative CT scans. At this point, we will treat him with antispasmotic to see if we can decreased his pain and stool frequency. ?underlying IBS. Start Levbid. Discussed diet for diverticulosis/diverticulitis. He will call with his next episode.   Right now his diarrhea has resolved. If he has recurrent diarrhea, consider C.Diff PCR.  Office visit with Dr. Jena Gauss in six weeks.

## 2013-08-09 NOTE — Progress Notes (Signed)
Primary Care Physician:  Colette Ribas, MD  Primary Gastroenterologist:  Roetta Sessions, MD   Chief Complaint  Patient presents with  . Abdominal Pain    HPI:  [name] Best is a 52 y.o. male here for further evaluation of abdominal pain. He gives a history of recurrent diverticulitis which is been difficult to manage this year. First episode of diverticulitis back in 2003, was hospitalized for several days. CT at that time showed sigmoid diverticulitis with a small contained perforation but no abscess. Patient has had multiple bouts of similar pain throughout the years requiring repeated CT scans and/or emergency department visits. He has received Cipro and Flagyl on multiple occasions, at least 3 times in the past 3 months. States usually within 2-3 days on the antibiotic he starts to feel better. Interestingly he always has diarrhea with these episodes, about 25% of patient will have change in bowels with acute diverticulitis.  This week is the best week so far in the past couple of months.  Also seemed to do well the week after his colonoscopy by Dr. Lovell Sheehan. Always a little dull pain in LLQ. Usually solid BM twice per day. During episodes, 5-10 stools per day. Usually pain severe, unable to work. We'll have increased abdominal pain when he tries to urinate as well. Denies history of kidney stones or blood in the urine. No recent brbpr. No heartburn on prilosec. Diclofenac twice per day since 03/2013 for plantar fascitis.    He notes that things he usually tolerates sometimes seemed to be the cause of another episode. Lately he has been consuming creamed potatoes and beans. Avoids dairy products chronically. Cannot tolerate salads. Notes when he takes diclofenac, his stomach just doesn't feel right.  Colonoscopy and upper endoscopy by Dr. Lovell Sheehan last month showed mild diverticulosis, small tubular adenoma removed from the colon, gastritis (CLOtest negative).  CTs of the abdomen and  pelvis in January 2014, June 2014, November 2012, November 2011, September 2010, August 2010 with NO evidence of acute diverticulitis.    Current Outpatient Prescriptions  Medication Sig Dispense Refill  . diazepam (VALIUM) 10 MG tablet Take 10 mg by mouth every 6 (six) hours as needed for anxiety.       . diclofenac (VOLTAREN) 75 MG EC tablet Take 75 mg by mouth 2 (two) times daily.      . diphenoxylate-atropine (LOMOTIL) 2.5-0.025 MG per tablet Take 1 tablet by mouth 4 (four) times daily as needed for diarrhea or loose stools.      Marland Kitchen omeprazole (PRILOSEC) 20 MG capsule Take 1 capsule (20 mg total) by mouth daily.  90 capsule  2  . pravastatin (PRAVACHOL) 20 MG tablet Take 20 mg by mouth daily.       No current facility-administered medications for this visit.    Allergies as of 08/09/2013 - Review Complete 08/09/2013  Allergen Reaction Noted  . Tetracyclines & related Hives 08/16/2011    Past Medical History  Diagnosis Date  . Diverticulitis     hospitalized in 2003  . Stomach pain   . Abdominal pain, left lower quadrant   . Chronic back pain   . Hyperlipidemia   . Anxiety   . PONV (postoperative nausea and vomiting)     Past Surgical History  Procedure Laterality Date  . Gallbladder surgery    . Left elbow    . Colonoscopy N/A 07/02/2013    Jenkins:Sessile polyp (tubular adenoma) ranging between 3-40mm in size in the proximal sigmoid colon/mild diverticulosis   .  Esophagogastroduodenoscopy N/A 07/02/2013    Jenkins:4 cm hiatal hernia/esophagus appeared normal/chronic gastritis. Clotest negative.  . Colonoscopy      Dr. Karilyn Cota: prior to 2003 per patient    Family History  Problem Relation Age of Onset  . Heart disease    . Colon cancer Neg Hx   . Colon polyps Neg Hx     History   Social History  . Marital Status: Married    Spouse Name: N/A    Number of Children: 2  . Years of Education: 12th grade   Occupational History  . 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 Smoking status: Never Smoker   . Smokeless tobacco: Not on file  . Alcohol Use: No  . Drug Use: No  . Sexual Activity: Not on file   Other Topics Concern  . Not on file   Social History Narrative  . No narrative on file      ROS:  General: Negative for anorexia, weight loss, fever, chills, fatigue, weakness. Eyes: Negative for vision changes.  ENT: Negative for hoarseness, difficulty swallowing , nasal congestion. CV: Negative for chest pain, angina, palpitations, dyspnea on exertion, peripheral edema.  Respiratory: Negative for dyspnea at rest, dyspnea on exertion, cough, sputum, wheezing.  GI: See history of present illness. GU:  Negative for dysuria, hematuria, urinary incontinence, urinary frequency, nocturnal urination.  MS: Negative for joint pain, low back pain.  Derm: Negative for rash or itching.  Neuro: Negative for weakness, abnormal sensation, seizure, frequent headaches, memory loss, confusion.  Psych: Negative for anxiety, depression, suicidal ideation, hallucinations.  Endo: Negative for unusual weight change.  Heme: Negative for bruising or bleeding. Allergy: Negative for rash or hives.    Physical Examination:  BP 152/90  Pulse 67  Temp(Src) 98.4 F (36.9 C) (Oral)  Ht 6\' 3"  (1.905 m)  Wt 264 lb 12.8 oz (120.112 kg)  BMI 33.1 kg/m2   General: Well-nourished, well-developed in no acute distress.  Head: Normocephalic, atraumatic.   Eyes: Conjunctiva pink, no icterus. Mouth: Oropharyngeal mucosa moist and pink , no lesions erythema or exudate. Neck: Supple without thyromegaly, masses, or lymphadenopathy.  Lungs: Clear to auscultation bilaterally.  Heart: Regular rate and rhythm, no murmurs rubs or gallops.  Abdomen: Bowel sounds are normal, minimal LLQ tenderness, nondistended, no hepatosplenomegaly or masses, no abdominal bruits or    hernia , no rebound or guarding.   Rectal: not performed Extremities: No  lower extremity edema. No clubbing or deformities.  Neuro: Alert and oriented x 4 , grossly normal neurologically.  Skin: Warm and dry, no rash or jaundice.   Psych: Alert and cooperative, normal mood and affect.  Labs: Lab Results  Component Value Date   WBC 7.5 07/20/2013   HGB 15.7 07/20/2013   HCT 47.2 07/20/2013   MCV 87.6 07/20/2013   PLT 205 07/20/2013   Lab Results  Component Value Date   CREATININE 1.12 07/20/2013   BUN 19 07/20/2013   NA 139 07/20/2013   K 4.1 07/20/2013   CL 106 07/20/2013   CO2 26 07/20/2013   Clotest negative 06/2013.  Imaging Studies: Ct Abdomen Pelvis W Contrast  07/20/2013   *RADIOLOGY REPORT*  Clinical Data: Lower quadrant pain  CT ABDOMEN AND PELVIS WITH CONTRAST  Technique:  Multidetector CT imaging of the abdomen and pelvis was performed following the standard protocol during bolus administration of intravenous contrast.  Contrast: 50mL OMNIPAQUE IOHEXOL 300 MG/ML  SOLN, OMNIPAQUE IOHEXOL  300 MG/ML  SOLN  Comparison: 05/28/2013  Findings: Post cholecystectomy  Diffuse hepatic steatosis.  Spleen, pancreas, adrenal glands, and right kidney are within normal limits.  Simple cyst in the left kidney.  Diverticulosis of the sigmoid colon is present without evidence of acute diverticulitis.  Moderate stool burden in the mid colon.  Mild atherosclerotic changes of the aorta and iliac vasculature  Bladder is mildly distended.  Unremarkable prostate gland  No free fluid.  No abnormal adenopathy.  Left inguinal hernia containing adipose tissue.  Mild lumbar degenerative disc disease.  No vertebral compression deformity.  IMPRESSION: No acute intra-abdominal or intrapelvic pathology.  Sigmoid diverticulosis.   Original Report Authenticated By: Jolaine Click, M.D.

## 2013-08-09 NOTE — Patient Instructions (Addendum)
1. Try Levbid one pill twice a day for abdominal pain, loose stool. Prescription sent to your pharmacy. 2. Call with next episode of abdominal pain, loose stool. 3. Office visit in six weeks.  Diverticulosis Diverticulosis is a common condition that develops when small pouches (diverticula) form in the wall of the colon. The risk of diverticulosis increases with age. It happens more often in people who eat a low-fiber diet. Most individuals with diverticulosis have no symptoms. Those individuals with symptoms usually experience abdominal pain, constipation, or loose stools (diarrhea). HOME CARE INSTRUCTIONS   Increase the amount of fiber in your diet as directed by your caregiver or dietician. This may reduce symptoms of diverticulosis.  Your caregiver may recommend taking a dietary fiber supplement.  Drink at least 6 to 8 glasses of water each day to prevent constipation.  Try not to strain when you have a bowel movement.  Your caregiver may recommend avoiding nuts and seeds to prevent complications, although this is still an uncertain benefit.  Only take over-the-counter or prescription medicines for pain, discomfort, or fever as directed by your caregiver. FOODS WITH HIGH FIBER CONTENT INCLUDE:  Fruits. Apple, peach, pear, tangerine, raisins, prunes.  Vegetables. Brussels sprouts, asparagus, broccoli, cabbage, carrot, cauliflower, romaine lettuce, spinach, summer squash, tomato, winter squash, zucchini.  Starchy Vegetables. Baked beans, kidney beans, lima beans, split peas, lentils, potatoes (with skin).  Grains. Whole wheat bread, brown rice, bran flake cereal, plain oatmeal, white rice, shredded wheat, bran muffins. SEEK IMMEDIATE MEDICAL CARE IF:   You develop increasing pain or severe bloating.  You have an oral temperature above 102 F (38.9 C), not controlled by medicine.  You develop vomiting or bowel movements that are bloody or black. Document Released: 09/08/2004  Document Revised: 03/05/2012 Document Reviewed: 05/12/2010 [facility] Patient Information 2014 [place], [place].  Diverticulitis A diverticulum is a small pouch or sac on the colon. Diverticulosis is the presence of these diverticula on the colon. Diverticulitis is the irritation (inflammation) or infection of diverticula. CAUSES  The colon and its diverticula contain bacteria. If food particles block the tiny opening to a diverticulum, the bacteria inside can grow and cause an increase in pressure. This leads to infection and inflammation and is called diverticulitis. SYMPTOMS   Abdominal pain and tenderness. Usually, the pain is located on the left side of your abdomen. However, it could be located elsewhere.  Fever.  Bloating.  Feeling sick to your stomach (nausea).  Throwing up (vomiting).  Abnormal stools. DIAGNOSIS  Your caregiver will take a history and perform a physical exam. Since many things can cause abdominal pain, other tests may be necessary. Tests may include:  Blood tests.  Urine tests.  X-ray of the abdomen.  CT scan of the abdomen. Sometimes, surgery is needed to determine if diverticulitis or other conditions are causing your symptoms. TREATMENT  Most of the time, you can be treated without surgery. Treatment includes:  Resting the bowels by only having liquids for a few days. As you improve, you will need to eat a low-fiber diet.  Intravenous (IV) fluids if you are losing body fluids (dehydrated).  Antibiotic medicines that treat infections may be given.  Pain and nausea medicine, if needed.  Surgery if the inflamed diverticulum has burst. HOME CARE INSTRUCTIONS   Try a clear liquid diet (broth, tea, or water for as long as directed by your caregiver). You may then gradually begin a low-fiber diet as tolerated.  A low-fiber diet is a diet with less  than 10 grams of fiber. Choose the foods below to reduce fiber in the diet:  White breads, cereals,  rice, and pasta.  Cooked fruits and vegetables or soft fresh fruits and vegetables without the skin.  Ground or well-cooked tender beef, ham, veal, lamb, pork, or poultry.  Eggs and seafood.  After your diverticulitis symptoms have improved, your caregiver may put you on a high-fiber diet. A high-fiber diet includes 14 grams of fiber for every 1000 calories consumed. For a standard 2000 calorie diet, you would need 28 grams of fiber. Follow these diet guidelines to help you increase the fiber in your diet. It is important to slowly increase the amount fiber in your diet to avoid gas, constipation, and bloating.  Choose whole-grain breads, cereals, pasta, and brown rice.  Choose fresh fruits and vegetables with the skin on. Do not overcook vegetables because the more vegetables are cooked, the more fiber is lost.  Choose more nuts, seeds, legumes, dried peas, beans, and lentils.  Look for food products that have greater than 3 grams of fiber per serving on the Nutrition Facts label.  Take all medicine as directed by your caregiver.  If your caregiver has given you a follow-up appointment, it is very important that you go. Not going could result in lasting (chronic) or permanent injury, pain, and disability. If there is any problem keeping the appointment, call to reschedule. SEEK MEDICAL CARE IF:   Your pain does not improve.  You have a hard time advancing your diet beyond clear liquids.  Your bowel movements do not return to normal. SEEK IMMEDIATE MEDICAL CARE IF:   Your pain becomes worse.  You have an oral temperature above 102 F (38.9 C), not controlled by medicine.  You have repeated vomiting.  You have bloody or black, tarry stools.  Symptoms that brought you to your caregiver become worse or are not getting better. MAKE SURE YOU:   Understand these instructions.  Will watch your condition.  Will get help right away if you are not doing well or get  worse. Document Released: 09/21/2005 Document Revised: 03/05/2012 Document Reviewed: 01/17/2011 [facility] Patient Information 2014 [place], [place].

## 2013-08-12 NOTE — Progress Notes (Signed)
CC'd to PCP 

## 2013-09-19 ENCOUNTER — Ambulatory Visit (INDEPENDENT_AMBULATORY_CARE_PROVIDER_SITE_OTHER): Payer: 59 | Admitting: Internal Medicine

## 2013-09-19 ENCOUNTER — Encounter: Payer: Self-pay | Admitting: Internal Medicine

## 2013-09-19 VITALS — BP 126/80 | HR 68 | Temp 97.2°F | Ht 75.0 in | Wt 272.6 lb

## 2013-09-19 DIAGNOSIS — K219 Gastro-esophageal reflux disease without esophagitis: Secondary | ICD-10-CM | POA: Insufficient documentation

## 2013-09-19 DIAGNOSIS — R197 Diarrhea, unspecified: Secondary | ICD-10-CM | POA: Insufficient documentation

## 2013-09-19 NOTE — Progress Notes (Signed)
Primary Care Physician:  Colette Ribas, MD Primary Gastroenterologist:  Dr. Jena Gauss  Pre-Procedure History & Physical: HPI:  [name] Best is a 52 y.o. male here for followup of left lower quadrant abdominal pain and diarrhea. Patient has a history of CT documented diverticulitis years ago. However, he has  Similar recurrent left lower quadrant pain and diarrhea this year; has had multiple CTs which have failed to elucidate a cause for his pain. Has been on multiple rounds of antibiotics. He tells me his pain has subsided; he's taken 2 doses of Levbid since he was last seen here for 2 episodes of pain. His big issue is ongoing nonbloody diarrhea  4-5 or more loose bowel movements daily. No nausea vomiting or fever. He noticed the onset of diarrhea, bloating and gas with the initiation of Voltaren  for plantar fasciitis back in April. He has not yet had a C. difficile toxin assay on his stool. He takes Prilosec for reflux which works nicely.  Past Medical History  Diagnosis Date  . Diverticulitis     hospitalized in 2003  . Stomach pain   . Abdominal pain, left lower quadrant   . Chronic back pain   . Hyperlipidemia   . Anxiety   . PONV (postoperative nausea and vomiting)     Past Surgical History  Procedure Laterality Date  . Gallbladder surgery    . Left elbow    . Colonoscopy N/A 07/02/2013    Jenkins:Sessile polyp (tubular adenoma) ranging between 3-52mm in size in the proximal sigmoid colon/mild diverticulosis   . Esophagogastroduodenoscopy N/A 07/02/2013    Jenkins:4 cm hiatal hernia/esophagus appeared normal/chronic gastritis. Clotest negative.  . Colonoscopy      Dr. Karilyn Cota: prior to 2003 per patient    Prior to Admission medications   Medication Sig Start Date End Date Taking? Authorizing Provider  diazepam (VALIUM) 10 MG tablet Take 10 mg by mouth every 6 (six) hours as needed for anxiety.    Yes Historical Provider, MD  diclofenac (VOLTAREN) 75 MG EC tablet Take 75 mg by  mouth 2 (two) times daily.   Yes Historical Provider, MD  diphenoxylate-atropine (LOMOTIL) 2.5-0.025 MG per tablet Take 1 tablet by mouth 4 (four) times daily as needed for diarrhea or loose stools.   Yes Historical Provider, MD  hyoscyamine (LEVBID) 0.375 MG 12 hr tablet Take 1 tablet (0.375 mg total) by mouth every 12 (twelve) hours as needed (abdominal pain, loose stool). 08/09/13  Yes Tiffany Kocher, PA-C  omeprazole (PRILOSEC) 20 MG capsule Take 1 capsule (20 mg total) by mouth daily. 07/02/13  Yes Dalia Heading, MD  pravastatin (PRAVACHOL) 20 MG tablet Take 20 mg by mouth daily.   Yes Historical Provider, MD    Allergies as of 09/19/2013 - Review Complete 09/19/2013  Allergen Reaction Noted  . Tetracyclines & related Hives 08/16/2011    Family History  Problem Relation Age of Onset  . Heart disease    . Colon cancer Neg Hx   . Colon polyps Neg Hx     History   Social History  . Marital Status: Married    Spouse Name: N/A    Number of Children: 2  . Years of Education: 12th grade   Occupational History  . 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 Smoking status: Never Smoker   . Smokeless tobacco: Not on file  . Alcohol Use: No  . Drug Use: No  . Sexual Activity:  Not on file   Other Topics Concern  . Not on file   Social History Narrative  . No narrative on file    Review of Systems: See HPI, otherwise negative ROS  Physical Exam: BP 126/80  Pulse 68  Temp(Src) 97.2 F (36.2 C) (Oral)  Ht 6\' 3"  (1.905 m)  Wt 272 lb 9.6 oz (123.651 kg)  BMI 34.07 kg/m2 General:   Alert,  Well-developed, well-nourished, pleasant and cooperative in NAD. He certainly does not appear toxic. Skin:  Intact without significant lesions or rashes. Eyes:  Sclera clear, no icterus.   Conjunctiva pink. Ears:  Normal auditory acuity. Nose:  No deformity, discharge,  or lesions. Mouth:  No deformity or lesions. Neck:  Supple; no masses or thyromegaly.  No significant cervical adenopathy. Lungs:  Clear throughout to auscultation.   No wheezes, crackles, or rhonchi. No acute distress. Heart:  Regular rate and rhythm; no murmurs, clicks, rubs,  or gallops. Abdomen: Non-distended, normal bowel sounds.  Soft and nontender without appreciable mass or hepatosplenomegaly.  Pulses:  Normal pulses noted. Extremities:  Without clubbing or edema.  Impression:   Very nice gentleman who has had a protracted illness this year characterized by intermittent left-sided abdominal pain and nonbloody diarrhea. CT scans failed to demonstrate an obvious cause for abdominal pain and had not demonstrated any evidence of diverticulitis. Certainly, low-grade inflammation of diverticula can occur below the sensitivity threshold of cross-sectional imaging. He's been treated adequately with standard antibiotic therapy for diverticulitis. The main concern now is persisting diarrhea. NSAID induced enterocolitis remains a real possibility with the onset of diarrhea correlating to the initiation of Voltaren therapy earlier this year. An episode of diverticular colitis, antibiotic associated diarrhea, microscopic colitis or C. difficile would remain in the differential as would postinfectious IBS. It is remarkable to note that 6 months ago he had what is described as normal bowel function -  having 1 formed stool daily.    Recommendations:  Stool for Cdiff assay.  Minimize [place] of Voltar[place]  Minimize use of Lomotil; use Imodium as needed  May continue to use Lev-Bid  Telephone follow-up  Office follow-up in 1 month

## 2013-09-19 NOTE — Patient Instructions (Addendum)
Stool for Cdiff assay.  Minimize [place] of Vol[place]  Minimize use of Lomotil; use Imodium as needed  May continue to use Lev-Bid  Telephone follow-up  Office follow-up in 1 month

## 2013-09-27 ENCOUNTER — Other Ambulatory Visit: Payer: Self-pay | Admitting: Gastroenterology

## 2013-09-27 MED ORDER — METRONIDAZOLE 500 MG PO TABS: 500.0000 mg | ORAL_TABLET | Freq: Three times a day (TID) | ORAL | Status: DC

## 2013-09-27 NOTE — Progress Notes (Signed)
Quick Note:  Spoke with patient. Prescription for Flagyl 500 mg by mouth 3 times a day for 10 days sent to [facility]. Recommended probiotic one daily or yogurt twice a day. ______

## 2013-10-18 ENCOUNTER — Ambulatory Visit: Payer: 59 | Admitting: Internal Medicine

## 2013-10-29 ENCOUNTER — Encounter: Payer: Self-pay | Admitting: Internal Medicine

## 2013-10-29 ENCOUNTER — Encounter (INDEPENDENT_AMBULATORY_CARE_PROVIDER_SITE_OTHER): Payer: Self-pay

## 2013-10-29 ENCOUNTER — Ambulatory Visit (INDEPENDENT_AMBULATORY_CARE_PROVIDER_SITE_OTHER): Payer: 59 | Admitting: Internal Medicine

## 2013-10-29 VITALS — BP 146/91 | HR 82 | Temp 97.1°F | Ht 75.0 in | Wt 273.6 lb

## 2013-10-29 DIAGNOSIS — K219 Gastro-esophageal reflux disease without esophagitis: Secondary | ICD-10-CM

## 2013-10-29 DIAGNOSIS — A498 Other bacterial infections of unspecified site: Secondary | ICD-10-CM

## 2013-10-29 DIAGNOSIS — K5731 Diverticulosis of large intestine without perforation or abscess with bleeding: Secondary | ICD-10-CM

## 2013-10-29 NOTE — Patient Instructions (Signed)
Begin Benefiber 2 teaspoons twice daily  Prilosec 20 mg daily  Imodium as needed  Need a colonoscopy in 2017 - history of polyps  Office visit in 3 months

## 2013-10-29 NOTE — Progress Notes (Signed)
Primary Care Physician:  Colette Ribas, MD Primary Gastroenterologist:  Dr. Jena Gauss  Pre-Procedure History & Physical: HPI:  [name] Best is a 52 y.o. male here for followup of nonbloody diarrhea. C. difficile toxin assay turned out to be positive.  He was treated with a course of Flagyl and yogurt. He has done well. Back to baseline. He notes that he may have one to 2 formed bowel movements daily but has to wipe multiple times for hygiene.  Prior left lower quadrant abdominal pain has resolved. He stopped taking well.. Reflux symptoms well controlled on Prilosec. History of colonic adenomas removed in Dr. Lovell Sheehan and this past summer.; Due for surveillance colonoscopy 3 years.  Past Medical History  Diagnosis Date  . Diverticulitis     hospitalized in 2003  . Stomach pain   . Abdominal pain, left lower quadrant   . Chronic back pain   . Hyperlipidemia   . Anxiety   . PONV (postoperative nausea and vomiting)   . C. difficile diarrhea 2014    treated with Flagyl    Past Surgical History  Procedure Laterality Date  . Gallbladder surgery    . Left elbow    . Colonoscopy N/A 07/02/2013    Jenkins:Sessile polyp (tubular adenoma) ranging between 3-53mm in size in the proximal sigmoid colon/mild diverticulosis   . Esophagogastroduodenoscopy N/A 07/02/2013    Jenkins:4 cm hiatal hernia/esophagus appeared normal/chronic gastritis. Clotest negative.  . Colonoscopy      Dr. Karilyn Cota: prior to 2003 per patient    Prior to Admission medications   Medication Sig Start Date End Date Taking? Authorizing Provider  diazepam (VALIUM) 10 MG tablet Take 10 mg by mouth every 6 (six) hours as needed for anxiety.    Yes Historical Provider, MD  diclofenac (VOLTAREN) 75 MG EC tablet Take 75 mg by mouth 2 (two) times daily.   Yes Historical Provider, MD  diphenoxylate-atropine (LOMOTIL) 2.5-0.025 MG per tablet Take 1 tablet by mouth 4 (four) times daily as needed for diarrhea or loose stools.   Yes  Historical Provider, MD  hyoscyamine (LEVBID) 0.375 MG 12 hr tablet Take 1 tablet (0.375 mg total) by mouth every 12 (twelve) hours as needed (abdominal pain, loose stool). 08/09/13  Yes Tiffany Kocher, PA-C  omeprazole (PRILOSEC) 20 MG capsule Take 1 capsule (20 mg total) by mouth daily. 07/02/13  Yes Dalia Heading, MD  pravastatin (PRAVACHOL) 20 MG tablet Take 20 mg by mouth daily.   Yes Historical Provider, MD  metroNIDAZOLE (FLAGYL) 500 MG tablet Take 1 tablet (500 mg total) by mouth 3 (three) times daily. 09/27/13   Tiffany Kocher, PA-C    Allergies as of 10/29/2013 - Review Complete 10/29/2013  Allergen Reaction Noted  . Tetracyclines & related Hives 08/16/2011    Family History  Problem Relation Age of Onset  . Heart disease    . Colon cancer Neg Hx   . Colon polyps Neg Hx     History   Social History  . Marital Status: Married    Spouse Name: N/A    Number of Children: 2  . Years of Education: 12th grade   Occupational History  . 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 Smoking status: Never Smoker   . Smokeless tobacco: Not on file  . Alcohol Use: No  . Drug Use: No  . Sexual Activity: Not on file   Other Topics Concern  . Not on file  Social History Narrative  . No narrative on file    Review of Systems: See HPI, otherwise negative ROS  Physical Exam: BP 146/91  Pulse 82  Temp(Src) 97.1 F (36.2 C) (Oral)  Ht 6\' 3"  (1.905 m)  Wt 273 lb 9.6 oz (124.104 kg)  BMI 34.20 kg/m2 General:   Alert,  Well-developed, well-nourished, pleasant and cooperative in NAD Skin:  Intact without significant lesions or rashes. Eyes:  Sclera clear, no icterus.   Conjunctiva pink. Ears:  Normal auditory acuity. Nose:  No deformity, discharge,  or lesions. Mouth:  No deformity or lesions. Neck:  Supple; no masses or thyromegaly. No significant cervical adenopathy. Lungs:  Clear throughout to auscultation.   No wheezes, crackles, or  rhonchi. No acute distress. Heart:  Regular rate and rhythm; no murmurs, clicks, rubs,  or gallops. Abdomen: Non-distended, normal bowel sounds.  Soft and nontender without appreciable mass or hepatosplenomegaly.  Pulses:  Normal pulses noted. Extremities:  Without clubbing or edema.  Impression:  Possible bout of diverticulitis earlier this year treated with antibiotics; coarse copy of my C. difficile infection. He has been treated. He is nearly back to baseline. He is having some hygiene issues. He may not be getting enough fiber in his diet to bulk his stool. He GERD symptoms well controlled on Prilosec. Begin Benefiber 2 teaspoons twice daily   Recommendations:  Benefiber 2 teaspoons twice daily to regimen.  Continue  Prilosec 20 mg daily  Imodium as needed  Need a colonoscopy in 2017 - history of polyps  Office visit in 3 months

## 2013-11-06 ENCOUNTER — Telehealth: Payer: Self-pay | Admitting: *Deleted

## 2013-11-06 MED ORDER — VANCOMYCIN HCL 125 MG PO CAPS: 125.0000 mg | ORAL_CAPSULE | Freq: Four times a day (QID) | ORAL | Status: DC

## 2013-11-06 NOTE — Telephone Encounter (Signed)
Patient needs vancomycin. Likely has recurrent C.Diff. Must start today. If no improvement in next 1-2 days, call us. Stay hydrated, urine should be light yellow. Hold levbid if feeling dizzy. Hold Prilosec while on treatment, can take Zantac 150mg  BID if needed.  Ginger, please ask patient to hold Prilosec and make sure on probiotic.

## 2013-11-06 NOTE — Telephone Encounter (Signed)
Pt came by the office. He is having diarrhea(nothing but pure water)5 times a day. He has finish the Flagyl. His stomach feels like it is boiling. When he drinks he is in the bathroom with in the hour. The Levbid make sleepy and dizzy.

## 2013-11-06 NOTE — Telephone Encounter (Signed)
Pt called having diarrhea lower abd. Pain. Pt has been taking flagyl leslie gave him, and also hyoscyamine sulfate for abd. Pain and loose stool. Medication is not working. Please advise [phone]

## 2013-11-06 NOTE — Telephone Encounter (Signed)
Pt is aware of every thing that was said. We are going to fax a letter to his work for him. He will start the anti-bx today. He will call us Friday by 10:00 to give [place] a report.

## 2014-01-10 ENCOUNTER — Ambulatory Visit (HOSPITAL_COMMUNITY)
Admission: RE | Admit: 2014-01-10 | Discharge: 2014-01-10 | Disposition: A | Discharge status: Home / Self Care | Payer: 59 | Source: Ambulatory Visit | Attending: Family Medicine | Admitting: Family Medicine

## 2014-01-10 ENCOUNTER — Other Ambulatory Visit (HOSPITAL_COMMUNITY): Payer: Self-pay | Admitting: Family Medicine

## 2014-01-10 DIAGNOSIS — M65839 Other synovitis and tenosynovitis, unspecified forearm: Secondary | ICD-10-CM

## 2014-01-10 DIAGNOSIS — M25539 Pain in unspecified wrist: Secondary | ICD-10-CM

## 2014-01-10 DIAGNOSIS — M65849 Other synovitis and tenosynovitis, unspecified hand: Secondary | ICD-10-CM

## 2014-01-10 DIAGNOSIS — M79609 Pain in unspecified limb: Secondary | ICD-10-CM | POA: Insufficient documentation

## 2014-01-29 ENCOUNTER — Ambulatory Visit: Payer: 59 | Admitting: Gastroenterology

## 2014-02-12 ENCOUNTER — Ambulatory Visit: Payer: 59 | Admitting: Gastroenterology

## 2014-02-25 ENCOUNTER — Encounter (INDEPENDENT_AMBULATORY_CARE_PROVIDER_SITE_OTHER): Payer: Self-pay

## 2014-02-25 ENCOUNTER — Ambulatory Visit (INDEPENDENT_AMBULATORY_CARE_PROVIDER_SITE_OTHER): Payer: 59 | Admitting: Gastroenterology

## 2014-02-25 ENCOUNTER — Encounter: Payer: Self-pay | Admitting: Gastroenterology

## 2014-02-25 VITALS — BP 144/91 | HR 66 | Temp 98.0°F | Ht 74.0 in | Wt 281.4 lb

## 2014-02-25 DIAGNOSIS — R197 Diarrhea, unspecified: Secondary | ICD-10-CM

## 2014-02-25 DIAGNOSIS — K219 Gastro-esophageal reflux disease without esophagitis: Secondary | ICD-10-CM

## 2014-02-25 NOTE — Progress Notes (Signed)
      Primary Care Physician: [name], MD  Primary Gastroenterologist:  Garfield Cornea, MD   Chief Complaint  Patient presents with  . Follow-up    HPI: [name] Best is a 53 y.o. male here for three-month followup. He was last seen back in November 2014 Dr. Gala Romney. History of nonbloody diarrhea, left lower quadrant abdominal pain intermittently. He was C. difficile positive last fall. He was treated with one course of Flagyl and yogurt did very well. He's been doing well since that time. Back at baseline of 3-4 soft stools daily. No bleeding unless lots of wiping. No antidiarrheals lately. No abdominal pain since 10/2013. Heartburn well-controlled on Prilosec. Due for surveillance colonoscopy in 3 years. Had a history of colonic adenomas by Dr. Arnoldo Morale July 2014.  Current Outpatient Prescriptions  Medication Sig Dispense Refill  . diazepam (VALIUM) 10 MG tablet Take 10 mg by mouth every 6 (six) hours as needed for anxiety.       . diclofenac (VOLTAREN) 75 MG EC tablet Take 75 mg by mouth 2 (two) times daily.      . diphenoxylate-atropine (LOMOTIL) 2.5-0.025 MG per tablet Take 1 tablet by mouth 4 (four) times daily as needed for diarrhea or loose stools.      . hyoscyamine (LEVBID) 0.375 MG 12 hr tablet Take 1 tablet (0.375 mg total) by mouth every 12 (twelve) hours as needed (abdominal pain, loose stool).  60 tablet  5  . omeprazole (PRILOSEC) 20 MG capsule Take 1 capsule (20 mg total) by mouth daily.  90 capsule  2  . pravastatin (PRAVACHOL) 20 MG tablet Take 20 mg by mouth daily.       No current facility-administered medications for this visit.    Allergies as of 02/25/2014 - Review Complete 02/25/2014  Allergen Reaction Noted  . Tetracyclines & related Hives 08/16/2011    ROS:  General: Negative for anorexia, weight loss, fever, chills, fatigue, weakness. ENT: Negative for hoarseness, difficulty swallowing , nasal congestion. CV: Negative for chest pain, angina,  palpitations, dyspnea on exertion, peripheral edema.  Respiratory: Negative for dyspnea at rest, dyspnea on exertion, cough, sputum, wheezing.  GI: See history of present illness. GU:  Negative for dysuria, hematuria, urinary incontinence, urinary frequency, nocturnal urination.  Endo: Negative for unusual weight change.    Physical Examination:   BP 144/91  Pulse 66  Temp(Src) 98 F (36.7 C) (Oral)  Ht 6\' 2"  (1.88 m)  Wt 281 lb 6.4 oz (127.642 kg)  BMI 36.11 kg/m2  General: Well-nourished, well-developed in no acute distress.  Eyes: No icterus. Mouth: Oropharyngeal mucosa moist and pink , no lesions erythema or exudate. Lungs: Clear to auscultation bilaterally.  Heart: Regular rate and rhythm, no murmurs rubs or gallops.  Abdomen: Bowel sounds are normal, nontender, nondistended, no hepatosplenomegaly or masses, no abdominal bruits or hernia , no rebound or guarding.   Extremities: No lower extremity edema. No clubbing or deformities. Neuro: Alert and oriented x 4   Skin: Warm and dry, no jaundice.   Psych: Alert and cooperative, normal mood and affect.

## 2014-02-25 NOTE — Progress Notes (Signed)
cc'd to pcp 

## 2014-02-25 NOTE — Assessment & Plan Note (Signed)
Doing well. Continue omeprazole daily.

## 2014-02-25 NOTE — Patient Instructions (Signed)
1. Office visit as needed. Call with any problems.

## 2014-02-25 NOTE — Assessment & Plan Note (Signed)
History of intermittent abdominal pain associated with diarrhea. Treated last year on multiple occasions for possible diverticulitis by his PCP. Developed C. difficile colitis last fall, treated with Flagyl. He's been doing well since that time. He will call with any further problems. He is due for surveillance colonoscopy for history of colonic adenomas in July of 2017.

## 2014-03-12 ENCOUNTER — Other Ambulatory Visit: Payer: Self-pay

## 2014-03-12 ENCOUNTER — Encounter: Payer: Self-pay | Admitting: Gastroenterology

## 2014-03-12 ENCOUNTER — Telehealth: Payer: Self-pay | Admitting: Internal Medicine

## 2014-03-12 DIAGNOSIS — R197 Diarrhea, unspecified: Secondary | ICD-10-CM

## 2014-03-12 NOTE — Telephone Encounter (Signed)
I spoke to pt. He feels he has C-Diff again. He is having about 10 episodes of diarrhea daily for a couple of days.   Per Laban Emperor, NP :  Stop Prilosec. Can take OTC Pepcid instead. Ordered C-Diff PCR Do not take Lomotil. Instead take Kaopectate.  Pt requested a work now for 7 days. Per Vicente Males, she cannot go back and excuse days before today. Note given to be out today through March 26th.

## 2014-03-12 NOTE — Telephone Encounter (Signed)
Pt came into waiting room and said he is hurting really bad and that LSL told him if he had problems to come by the office (?) I told him to have a seat and I would have a nurse come talk with him, Please advise

## 2014-03-12 NOTE — Telephone Encounter (Signed)
Agree 

## 2014-03-18 LAB — CLOSTRIDIUM DIFFICILE BY PCR: CDIFFPCR: NOT DETECTED

## 2014-03-18 NOTE — Progress Notes (Signed)
Quick Note:  Negative Cdiff.  How is patient doing? ______ 

## 2014-03-19 ENCOUNTER — Telehealth: Payer: Self-pay | Admitting: Internal Medicine

## 2014-03-19 NOTE — Telephone Encounter (Signed)
Tried to call pt- NA and no voicemail. 

## 2014-03-19 NOTE — Telephone Encounter (Signed)
Pt called today asking if his results were back yet and also he said that he had left papers here to be filled out regarding being out of work. He also said that he will need a work note to return to work on 3/27. He will be coming by to pick these up. Please call him at [phone]

## 2014-03-20 NOTE — Telephone Encounter (Signed)
Pt picked up papers

## 2014-03-20 NOTE — Telephone Encounter (Signed)
Tried to call pt- NA and no voicemail. Papers are at the front desk for pt to pick up. See result note.

## 2014-10-07 ENCOUNTER — Ambulatory Visit (HOSPITAL_COMMUNITY)
Admission: RE | Admit: 2014-10-07 | Discharge: 2014-10-07 | Disposition: A | Discharge status: Home / Self Care | Payer: 59 | Source: Ambulatory Visit | Attending: Internal Medicine | Admitting: Internal Medicine

## 2014-10-07 ENCOUNTER — Other Ambulatory Visit (HOSPITAL_COMMUNITY): Payer: Self-pay | Admitting: Internal Medicine

## 2014-10-07 DIAGNOSIS — W1789XA Other fall from one level to another, initial encounter: Secondary | ICD-10-CM | POA: Insufficient documentation

## 2014-10-07 DIAGNOSIS — S3992XA Unspecified injury of lower back, initial encounter: Secondary | ICD-10-CM | POA: Insufficient documentation

## 2015-02-17 ENCOUNTER — Other Ambulatory Visit (HOSPITAL_COMMUNITY): Payer: Self-pay | Admitting: Orthopaedic Surgery

## 2015-02-17 DIAGNOSIS — M25562 Pain in left knee: Secondary | ICD-10-CM

## 2015-02-23 ENCOUNTER — Ambulatory Visit (HOSPITAL_COMMUNITY)
Admission: RE | Admit: 2015-02-23 | Discharge: 2015-02-23 | Disposition: A | Discharge status: Home / Self Care | Payer: 59 | Source: Ambulatory Visit | Attending: Orthopaedic Surgery | Admitting: Orthopaedic Surgery

## 2015-02-23 DIAGNOSIS — M23222 Derangement of posterior horn of medial meniscus due to old tear or injury, left knee: Secondary | ICD-10-CM | POA: Insufficient documentation

## 2015-02-23 DIAGNOSIS — M7122 Synovial cyst of popliteal space [Baker], left knee: Secondary | ICD-10-CM | POA: Diagnosis not present

## 2015-02-23 DIAGNOSIS — M1712 Unilateral primary osteoarthritis, left knee: Secondary | ICD-10-CM | POA: Insufficient documentation

## 2015-02-23 DIAGNOSIS — M25562 Pain in left knee: Secondary | ICD-10-CM

## 2015-03-17 ENCOUNTER — Other Ambulatory Visit: Payer: Self-pay | Admitting: Radiology

## 2015-04-07 NOTE — Patient Instructions (Signed)
[name] Best  04/07/2015   Your procedure is scheduled on:   04/14/2015  Report to [facility] at  45  AM.  Call this number if you have problems the morning of surgery: [phone]   Remember:   Do not eat food or drink liquids after midnight.   Take these medicines the morning of surgery with A SIP OF WATER: valium, hydrocodone, prilosec   Do not wear jewelry, make-up or nail polish.  Do not wear lotions, powders, or perfumes.   Do not shave 48 hours prior to surgery. Men may shave face and neck.  Do not bring valuables to the hospital.  [facility] is not responsible for any belongings or valuables.               Contacts, dentures or bridgework may not be worn into surgery.  Leave suitcase in the car. After surgery it may be brought to your room.  For patients admitted to the hospital, discharge time is determined by your treatment team.               Patients discharged the day of surgery will not be allowed to drive home.  Name and phone number of your driver: family  Special Instructions: Shower using CHG 2 nights before surgery and the night before surgery.  If you shower the day of surgery use CHG.  Use special wash - you have one bottle of CHG for all showers.  You should use approximately 1/3 of the bottle for each shower.   Please read over the following fact sheets that you were given: Pain Booklet, Coughing and Deep Breathing, Surgical Site Infection Prevention, Anesthesia Post-op Instructions and Care and Recovery After Surgery Arthroscopic Procedure, Knee An arthroscopic procedure can find what is wrong with your knee. PROCEDURE Arthroscopy is a surgical technique that allows your orthopedic surgeon to diagnose and treat your knee injury with accuracy. They will look into your knee through a small instrument. This is almost like a small (pencil sized) telescope. Because arthroscopy affects your knee less than open knee surgery, you can anticipate a more rapid  recovery. Taking an active role by following your caregiver's instructions will help with rapid and complete recovery. Use crutches, rest, elevation, ice, and knee exercises as instructed. The length of recovery depends on various factors including type of injury, age, physical condition, medical conditions, and your rehabilitation. Your knee is the joint between the large bones (femur and tibia) in your leg. Cartilage covers these bone ends which are smooth and slippery and allow your knee to bend and move smoothly. Two menisci, thick, semi-lunar shaped pads of cartilage which form a rim inside the joint, help absorb shock and stabilize your knee. Ligaments bind the bones together and support your knee joint. Muscles move the joint, help support your knee, and take stress off the joint itself. Because of this all programs and physical therapy to rehabilitate an injured or repaired knee require rebuilding and strengthening your muscles. AFTER THE PROCEDURE  After the procedure, you will be moved to a recovery area until most of the effects of the medication have worn off. Your caregiver will discuss the test results with you.  Only take over-the-counter or prescription medicines for pain, discomfort, or fever as directed by your caregiver. SEEK MEDICAL CARE IF:   You have increased bleeding from your wounds.  You see redness, swelling, or have increasing pain in your wounds.  You have pus coming  from your wound.  You have an oral temperature above 102 F (38.9 C).  You notice a bad smell coming from the wound or dressing.  You have severe pain with any motion of your knee. SEEK IMMEDIATE MEDICAL CARE IF:   You develop a rash.  You have difficulty breathing.  You have any allergic problems. Document Released: 12/09/2000 Document Revised: 03/05/2012 Document Reviewed: 07/02/2008 [facility] Patient Information 2015 [place], [place]. This information is not intended to replace advice given to  you by your health care provider. Make sure you discuss any questions you have with your health care provider. PATIENT INSTRUCTIONS POST-ANESTHESIA  IMMEDIATELY FOLLOWING SURGERY:  Do not drive or operate machinery for the first twenty four hours after surgery.  Do not make any important decisions for twenty four hours after surgery or while taking narcotic pain medications or sedatives.  If you develop intractable nausea and vomiting or a severe headache please notify your doctor immediately.  FOLLOW-UP:  Please make an appointment with your surgeon as instructed. You do not need to follow up with anesthesia unless specifically instructed to do so.  WOUND CARE INSTRUCTIONS (if applicable):  Keep a dry clean dressing on the anesthesia/puncture wound site if there is drainage.  Once the wound has quit draining you may leave it open to air.  Generally you should leave the bandage intact for twenty four hours unless there is drainage.  If the epidural site drains for more than 36-48 hours please call the anesthesia department.  QUESTIONS?:  Please feel free to call your physician or the hospital operator if you have any questions, and they will be happy to assist you.

## 2015-04-09 ENCOUNTER — Encounter (HOSPITAL_COMMUNITY): Payer: Self-pay

## 2015-04-09 ENCOUNTER — Encounter (HOSPITAL_COMMUNITY)
Admission: RE | Admit: 2015-04-09 | Discharge: 2015-04-09 | Disposition: A | Discharge status: Home / Self Care | Payer: 59 | Source: Ambulatory Visit | Attending: Orthopaedic Surgery | Admitting: Orthopaedic Surgery

## 2015-04-09 DIAGNOSIS — Z01812 Encounter for preprocedural laboratory examination: Secondary | ICD-10-CM | POA: Diagnosis present

## 2015-04-09 DIAGNOSIS — Z01810 Encounter for preprocedural cardiovascular examination: Secondary | ICD-10-CM | POA: Insufficient documentation

## 2015-04-09 LAB — CBC WITH DIFFERENTIAL/PLATELET
BASOS ABS: 0.1 10*3/uL (ref 0.0–0.1)
Basophils Relative: 1 % (ref 0–1)
EOS PCT: 4 % (ref 0–5)
Eosinophils Absolute: 0.3 10*3/uL (ref 0.0–0.7)
HCT: 44.5 % (ref 39.0–52.0)
HEMOGLOBIN: 14.4 g/dL (ref 13.0–17.0)
LYMPHS PCT: 18 % (ref 12–46)
Lymphs Abs: 1.3 10*3/uL (ref 0.7–4.0)
MCH: 28.7 pg (ref 26.0–34.0)
MCHC: 32.4 g/dL (ref 30.0–36.0)
MCV: 88.8 fL (ref 78.0–100.0)
Monocytes Absolute: 0.6 10*3/uL (ref 0.1–1.0)
Monocytes Relative: 8 % (ref 3–12)
NEUTROS PCT: 69 % (ref 43–77)
Neutro Abs: 5.2 10*3/uL (ref 1.7–7.7)
PLATELETS: 203 10*3/uL (ref 150–400)
RBC: 5.01 MIL/uL (ref 4.22–5.81)
RDW: 14.1 % (ref 11.5–15.5)
WBC: 7.4 10*3/uL (ref 4.0–10.5)

## 2015-04-09 LAB — COMPREHENSIVE METABOLIC PANEL
ALBUMIN: 3.5 g/dL (ref 3.5–5.2)
ALT: 16 U/L (ref 0–53)
ANION GAP: 7 (ref 5–15)
AST: 16 U/L (ref 0–37)
Alkaline Phosphatase: 82 U/L (ref 39–117)
BILIRUBIN TOTAL: 0.4 mg/dL (ref 0.3–1.2)
BUN: 13 mg/dL (ref 6–23)
CALCIUM: 8.9 mg/dL (ref 8.4–10.5)
CHLORIDE: 105 mmol/L (ref 96–112)
CO2: 29 mmol/L (ref 19–32)
Creatinine, Ser: 1.22 mg/dL (ref 0.50–1.35)
GFR calc Af Amer: 75 mL/min — ABNORMAL LOW (ref >90)
GFR, EST NON AFRICAN AMERICAN: 65 mL/min — AB (ref >90)
Glucose, Bld: 135 mg/dL — ABNORMAL HIGH (ref 70–99)
POTASSIUM: 4.3 mmol/L (ref 3.5–5.1)
Sodium: 141 mmol/L (ref 135–145)
TOTAL PROTEIN: 6.5 g/dL (ref 6.0–8.3)

## 2015-04-09 LAB — URINALYSIS, ROUTINE W REFLEX MICROSCOPIC
Bilirubin Urine: NEGATIVE
GLUCOSE, UA: NEGATIVE mg/dL
KETONES UR: NEGATIVE mg/dL
Leukocytes, UA: NEGATIVE
Nitrite: NEGATIVE
PH: 5.5 (ref 5.0–8.0)
Protein, ur: NEGATIVE mg/dL
Specific Gravity, Urine: 1.025 (ref 1.005–1.030)
Urobilinogen, UA: 0.2 mg/dL (ref 0.0–1.0)

## 2015-04-09 LAB — URINE MICROSCOPIC-ADD ON

## 2015-04-13 NOTE — Pre-Procedure Instructions (Signed)
Dr Gonzalez aware of EKG. No orders given. 

## 2015-04-13 NOTE — H&P (Signed)
[name] Best is an 54 y.o. male.   Chief Complaint: Left knee torn cartilage HPI: He has had about a six month history of left knee pain.  It gradually got worse and he had popping, swelling and giving way.  MRI done on 02/23/15 shows a tear of the medial meniscus of the left knee posterior horn.  I recommended outpatient arthroscopy of the left knee.  I went over risks and imponderables of this elective procedure with him.  He has elected to have surgery at this time. He understands this will not correct any arthritis in the knee.  He is to have outpatient physical therapy after surgery.  Past Medical History  Diagnosis Date  . Diverticulitis     hospitalized in 2003  . Stomach pain   . Abdominal pain, left lower quadrant   . Chronic back pain   . Hyperlipidemia   . Anxiety   . PONV (postoperative nausea and vomiting)   . C. difficile diarrhea 2014    treated with Flagyl    Past Surgical History  Procedure Laterality Date  . Gallbladder surgery    . Left elbow    . Colonoscopy N/A 07/02/2013    Jenkins:Sessile polyp (tubular adenoma) ranging between 3-80mm in size in the proximal sigmoid colon/mild diverticulosis   . Esophagogastroduodenoscopy N/A 07/02/2013    Jenkins:4 cm hiatal hernia/esophagus appeared normal/chronic gastritis. Clotest negative.  . Colonoscopy      Dr. Laural Golden: prior to 2003 per patient    Family History  Problem Relation Age of Onset  . Heart disease    . Colon cancer Neg Hx   . Colon polyps Neg Hx    Social History:  reports that he has never smoked. He does not have any smokeless tobacco history on file. He reports that he does not drink alcohol or use illicit drugs.  Allergies:  Allergies  Allergen Reactions  . Tetracyclines & Related Hives    No prescriptions prior to admission    No results found for this or any previous visit (from the past 48 hour(s)). No results found.  Review of Systems  Gastrointestinal: Positive for heartburn.   Diverticulosis, GERD, Diarrhea history  Musculoskeletal: Positive for joint pain (Pain of left knee for six months.  Giving way, popping.).    There were no vitals taken for this visit. Physical Exam  Constitutional: He appears well-developed and well-nourished.  HENT:  Head: Normocephalic and atraumatic.  Eyes: Conjunctivae and EOM are normal. Pupils are equal, round, and reactive to light.  Neck: Normal range of motion. Neck supple.  Cardiovascular: Normal rate, regular rhythm, normal heart sounds and intact distal pulses.   Respiratory: Effort normal and breath sounds normal.  Musculoskeletal: He exhibits tenderness (Pain left knee with effusion, ROM 0 to 105, medial joint line pain and positive Medial [place], crepitus, NV intact.).  Neurological: He is alert. He has normal reflexes.  Skin: Skin is warm and dry.  Psychiatric: He has a normal mood and affect. His behavior is normal. Judgment and thought content normal.     Assessment/Plan Tear of the left knee medial meniscus posterior horn.  For elective arthroscopy of the knee on the left.  Zamyah Wiesman 04/13/2015, 2:29 PM

## 2015-04-14 ENCOUNTER — Ambulatory Visit (HOSPITAL_COMMUNITY): Payer: 59 | Admitting: Anesthesiology

## 2015-04-14 ENCOUNTER — Encounter (HOSPITAL_COMMUNITY)
Admission: RE | Disposition: A | Discharge status: Home / Self Care | Payer: Self-pay | Source: Ambulatory Visit | Attending: Orthopaedic Surgery

## 2015-04-14 ENCOUNTER — Ambulatory Visit (HOSPITAL_COMMUNITY)
Admission: RE | Admit: 2015-04-14 | Discharge: 2015-04-14 | Disposition: A | Discharge status: Home / Self Care | Payer: 59 | Source: Ambulatory Visit | Attending: Orthopaedic Surgery | Admitting: Orthopaedic Surgery

## 2015-04-14 ENCOUNTER — Encounter (HOSPITAL_COMMUNITY): Payer: Self-pay | Admitting: *Deleted

## 2015-04-14 DIAGNOSIS — X58XXXA Exposure to other specified factors, initial encounter: Secondary | ICD-10-CM | POA: Diagnosis not present

## 2015-04-14 DIAGNOSIS — Y999 Unspecified external cause status: Secondary | ICD-10-CM | POA: Diagnosis not present

## 2015-04-14 DIAGNOSIS — Y939 Activity, unspecified: Secondary | ICD-10-CM | POA: Insufficient documentation

## 2015-04-14 DIAGNOSIS — K219 Gastro-esophageal reflux disease without esophagitis: Secondary | ICD-10-CM | POA: Diagnosis not present

## 2015-04-14 DIAGNOSIS — Y929 Unspecified place or not applicable: Secondary | ICD-10-CM | POA: Diagnosis not present

## 2015-04-14 DIAGNOSIS — M549 Dorsalgia, unspecified: Secondary | ICD-10-CM | POA: Insufficient documentation

## 2015-04-14 DIAGNOSIS — G8929 Other chronic pain: Secondary | ICD-10-CM | POA: Diagnosis not present

## 2015-04-14 DIAGNOSIS — S83242A Other tear of medial meniscus, current injury, left knee, initial encounter: Secondary | ICD-10-CM | POA: Diagnosis present

## 2015-04-14 HISTORY — PX: KNEE ARTHROSCOPY WITH MEDIAL MENISECTOMY: SHX5651

## 2015-04-14 SURGERY — ARTHROSCOPY, KNEE, WITH MEDIAL MENISCECTOMY
Anesthesia: General | Site: Knee | Laterality: Left

## 2015-04-14 MED ORDER — CHLORHEXIDINE GLUCONATE 4 % EX LIQD: 60.0000 mL | Freq: Once | CUTANEOUS | Status: DC

## 2015-04-14 MED ORDER — LIDOCAINE HCL (PF) 1 % IJ SOLN
INTRAMUSCULAR | Status: AC
  Filled 2015-04-14: qty 5

## 2015-04-14 MED ORDER — ONDANSETRON HCL 4 MG/2ML IJ SOLN
4.0000 mg | Freq: Once | INTRAMUSCULAR | Status: AC
  Administered 2015-04-14: 4 mg via INTRAVENOUS

## 2015-04-14 MED ORDER — BUPIVACAINE HCL (PF) 0.25 % IJ SOLN
INTRAMUSCULAR | Status: AC
  Filled 2015-04-14: qty 30

## 2015-04-14 MED ORDER — BUPIVACAINE HCL (PF) 0.25 % IJ SOLN
INTRAMUSCULAR | Status: DC | PRN
  Administered 2015-04-14: 30 mL

## 2015-04-14 MED ORDER — FENTANYL CITRATE (PF) 100 MCG/2ML IJ SOLN
INTRAMUSCULAR | Status: AC
  Filled 2015-04-14: qty 2

## 2015-04-14 MED ORDER — LACTATED RINGERS IV SOLN
INTRAVENOUS | Status: DC
  Administered 2015-04-14: 1000 mL via INTRAVENOUS

## 2015-04-14 MED ORDER — DEXAMETHASONE SODIUM PHOSPHATE 4 MG/ML IJ SOLN
4.0000 mg | Freq: Once | INTRAMUSCULAR | Status: AC
  Administered 2015-04-14: 4 mg via INTRAVENOUS

## 2015-04-14 MED ORDER — PROPOFOL 10 MG/ML IV BOLUS
INTRAVENOUS | Status: DC | PRN
  Administered 2015-04-14: 170 mg via INTRAVENOUS

## 2015-04-14 MED ORDER — FENTANYL CITRATE (PF) 100 MCG/2ML IJ SOLN
25.0000 ug | INTRAMUSCULAR | Status: AC
  Administered 2015-04-14 (×2): 25 ug via INTRAVENOUS

## 2015-04-14 MED ORDER — LIDOCAINE HCL (CARDIAC) 20 MG/ML IV SOLN
INTRAVENOUS | Status: DC | PRN
  Administered 2015-04-14: 50 mg via INTRAVENOUS

## 2015-04-14 MED ORDER — PROPOFOL 10 MG/ML IV BOLUS
INTRAVENOUS | Status: AC
  Filled 2015-04-14: qty 20

## 2015-04-14 MED ORDER — FENTANYL CITRATE (PF) 100 MCG/2ML IJ SOLN
INTRAMUSCULAR | Status: DC | PRN
  Administered 2015-04-14 (×4): 25 ug via INTRAVENOUS
  Administered 2015-04-14: 50 ug via INTRAVENOUS
  Administered 2015-04-14 (×2): 25 ug via INTRAVENOUS

## 2015-04-14 MED ORDER — SODIUM CHLORIDE 0.9 % IJ SOLN
INTRAMUSCULAR | Status: AC
  Filled 2015-04-14: qty 10

## 2015-04-14 MED ORDER — LACTATED RINGERS IR SOLN
Status: DC | PRN
  Administered 2015-04-14 (×3): 3000 mL

## 2015-04-14 MED ORDER — ONDANSETRON HCL 4 MG/2ML IJ SOLN
4.0000 mg | Freq: Once | INTRAMUSCULAR | Status: AC | PRN
  Administered 2015-04-14: 4 mg via INTRAVENOUS
  Filled 2015-04-14: qty 2

## 2015-04-14 MED ORDER — EPHEDRINE SULFATE 50 MG/ML IJ SOLN
INTRAMUSCULAR | Status: AC
  Filled 2015-04-14: qty 1

## 2015-04-14 MED ORDER — MIDAZOLAM HCL 2 MG/2ML IJ SOLN
INTRAMUSCULAR | Status: AC
  Filled 2015-04-14: qty 2

## 2015-04-14 MED ORDER — HYDROCODONE-ACETAMINOPHEN 7.5-325 MG PO TABS: 1.0000 | ORAL_TABLET | ORAL | Status: DC | PRN

## 2015-04-14 MED ORDER — MIDAZOLAM HCL 2 MG/2ML IJ SOLN
1.0000 mg | INTRAMUSCULAR | Status: DC | PRN
  Administered 2015-04-14: 2 mg via INTRAVENOUS

## 2015-04-14 MED ORDER — ONDANSETRON HCL 4 MG/2ML IJ SOLN
INTRAMUSCULAR | Status: AC
  Filled 2015-04-14: qty 2

## 2015-04-14 MED ORDER — SUCCINYLCHOLINE CHLORIDE 20 MG/ML IJ SOLN
INTRAMUSCULAR | Status: AC
  Filled 2015-04-14: qty 1

## 2015-04-14 MED ORDER — MEPERIDINE HCL 50 MG/ML IJ SOLN: 6.2500 mg | Freq: Once | INTRAMUSCULAR | Status: DC

## 2015-04-14 MED ORDER — DEXAMETHASONE SODIUM PHOSPHATE 4 MG/ML IJ SOLN
INTRAMUSCULAR | Status: AC
  Filled 2015-04-14: qty 1

## 2015-04-14 MED ORDER — FENTANYL CITRATE (PF) 100 MCG/2ML IJ SOLN: 25.0000 ug | INTRAMUSCULAR | Status: DC | PRN

## 2015-04-14 SURGICAL SUPPLY — 55 items
BAG HAMPER (MISCELLANEOUS) ×4 IMPLANT
BANDAGE ELASTIC 6 VELCRO NS (GAUZE/BANDAGES/DRESSINGS) ×4 IMPLANT
BANDAGE ESMARK 4X12 BL STRL LF (DISPOSABLE) ×2 IMPLANT
BLADE SURG 15 STRL LF DISP TIS (BLADE) ×2 IMPLANT
BLADE SURG 15 STRL SS (BLADE) ×2
BLADE SURG SZ11 CARB STEEL (BLADE) ×4 IMPLANT
BNDG ESMARK 4X12 BLUE STRL LF (DISPOSABLE) ×4
CLOTH BEACON ORANGE TIMEOUT ST (SAFETY) ×4 IMPLANT
CUFF TOURNIQUET SINGLE 34IN LL (TOURNIQUET CUFF) ×4 IMPLANT
CUFF TOURNIQUET SINGLE 44IN (TOURNIQUET CUFF) IMPLANT
CUTTER TOMCAT 4.0M (BURR) ×4 IMPLANT
DECANTER SPIKE VIAL GLASS SM (MISCELLANEOUS) ×4 IMPLANT
DRAPE PROXIMA HALF (DRAPES) ×4 IMPLANT
DRSG XEROFORM 1X8 (GAUZE/BANDAGES/DRESSINGS) ×4 IMPLANT
DURAPREP 26ML APPLICATOR (WOUND CARE) ×4 IMPLANT
ELECT MENISCUS 165MM 90D (ELECTRODE) IMPLANT
FORMALIN 10 PREFIL 480ML (MISCELLANEOUS) ×4 IMPLANT
GAUZE SPONGE 4X4 12PLY STRL (GAUZE/BANDAGES/DRESSINGS) ×4 IMPLANT
GAUZE SPONGE 4X4 16PLY XRAY LF (GAUZE/BANDAGES/DRESSINGS) ×4 IMPLANT
GLOVE BIO SURGEON STRL SZ8 (GLOVE) ×4 IMPLANT
GLOVE BIO SURGEON STRL SZ8.5 (GLOVE) ×4 IMPLANT
GLOVE BIOGEL PI IND STRL 7.5 (GLOVE) ×2 IMPLANT
GLOVE BIOGEL PI INDICATOR 7.5 (GLOVE) ×2
GLOVE EXAM NITRILE MD LF STRL (GLOVE) ×4 IMPLANT
GLOVE SURG SS PI 7.5 STRL IVOR (GLOVE) ×4 IMPLANT
GOWN STRL REUS W/TWL LRG LVL3 (GOWN DISPOSABLE) ×4 IMPLANT
GOWN STRL REUS W/TWL XL LVL3 (GOWN DISPOSABLE) ×4 IMPLANT
HANDPIECE (MISCELLANEOUS) IMPLANT
HLDR LEG FOAM (MISCELLANEOUS) ×2 IMPLANT
IV LACTATED RINGER IRRG 3000ML (IV SOLUTION) ×6
IV LR IRRIG 3000ML ARTHROMATIC (IV SOLUTION) ×6 IMPLANT
KIT BLADEGUARD II DBL (SET/KITS/TRAYS/PACK) ×4 IMPLANT
KIT ROOM TURNOVER AP CYSTO (KITS) ×4 IMPLANT
KNIFE HOOK (BLADE) IMPLANT
LEG HOLDER FOAM (MISCELLANEOUS) ×2
MANIFOLD NEPTUNE II (INSTRUMENTS) ×4 IMPLANT
MARKER SKIN DUAL TIP RULER LAB (MISCELLANEOUS) ×4 IMPLANT
NEEDLE HYPO 21X1.5 SAFETY (NEEDLE) ×4 IMPLANT
NEEDLE SPNL 18GX3.5 QUINCKE PK (NEEDLE) ×4 IMPLANT
NS IRRIG 1000ML POUR BTL (IV SOLUTION) ×4 IMPLANT
PACK ARTHRO LIMB DRAPE STRL (MISCELLANEOUS) ×4 IMPLANT
PAD ABD 5X9 TENDERSORB (GAUZE/BANDAGES/DRESSINGS) ×4 IMPLANT
PAD ARMBOARD 7.5X6 YLW CONV (MISCELLANEOUS) ×4 IMPLANT
PADDING CAST COTTON 6X4 STRL (CAST SUPPLIES) ×4 IMPLANT
PADDING WEBRIL 6 STERILE (GAUZE/BANDAGES/DRESSINGS) ×4 IMPLANT
SET ARTHROSCOPY INST (INSTRUMENTS) ×4 IMPLANT
SET BASIN LINEN APH (SET/KITS/TRAYS/PACK) ×4 IMPLANT
SPONGE GAUZE 4X4 12PLY (GAUZE/BANDAGES/DRESSINGS) ×3 IMPLANT
SUT ETHILON 3 0 FSL (SUTURE) ×4 IMPLANT
SYR 30ML LL (SYRINGE) ×4 IMPLANT
TIP 0 DEGREE (MISCELLANEOUS) IMPLANT
TIP 30 DEGREE (MISCELLANEOUS) IMPLANT
TIP 70 DEGREE (MISCELLANEOUS) IMPLANT
TUBING FLOSTEADY ARTHRO PUMP (IRRIGATION / IRRIGATOR) ×4 IMPLANT
YANKAUER SUCT BULB TIP 10FT TU (MISCELLANEOUS) ×8 IMPLANT

## 2015-04-14 NOTE — Anesthesia Preprocedure Evaluation (Signed)
Anesthesia Evaluation  Patient identified by MRN, date of birth, ID band Patient awake    Reviewed: Allergy & Precautions, NPO status , Patient's Chart, lab work & pertinent test results  History of Anesthesia Complications (+) PONV and history of anesthetic complications  Airway Mallampati: II  TM Distance: >3 FB     Dental  (+) Teeth Intact   Pulmonary neg pulmonary ROS,  breath sounds clear to auscultation        Cardiovascular negative cardio ROS  Rhythm:Irregular Rate:Normal     Neuro/Psych PSYCHIATRIC DISORDERS Anxiety    GI/Hepatic   Endo/Other    Renal/GU      Musculoskeletal Chronic LBP-narcotics    Abdominal   Peds  Hematology   Anesthesia Other Findings   Reproductive/Obstetrics                             Anesthesia Physical Anesthesia Plan  ASA: II  Anesthesia Plan: General   Post-op Pain Management:    Induction: Intravenous  Airway Management Planned: LMA  Additional Equipment:   Intra-op Plan:   Post-operative Plan: Extubation in OR  Informed Consent: I have reviewed the patients History and Physical, chart, labs and discussed the procedure including the risks, benefits and alternatives for the proposed anesthesia with the patient or authorized representative who has indicated his/her understanding and acceptance.     Plan Discussed with:   Anesthesia Plan Comments:         Anesthesia Quick Evaluation

## 2015-04-14 NOTE — Progress Notes (Signed)
The History and Physical is unchanged. I have examined the patient. The patient is medically able to have surgery on the left knee . [name] Best  

## 2015-04-14 NOTE — Anesthesia Postprocedure Evaluation (Signed)
  Anesthesia Post-op Note Late Entry  Patient: [name] Best  Procedure(s) Performed: Procedure(s): KNEE ARTHROSCOPY WITH MEDIAL MENISECTOMY (Left)  Patient Location: PACU  Anesthesia Type:General  Level of Consciousness: awake, alert , oriented and patient cooperative  Airway and Oxygen Therapy: Patient Spontanous Breathing and Patient connected to face mask oxygen  Post-op Pain: mild  Post-op Assessment: Post-op Vital signs reviewed, Patient's Cardiovascular Status Stable, Respiratory Function Stable, Patent Airway, No signs of Nausea or vomiting and Pain level controlled  Post-op Vital Signs: Reviewed and stable  Last Vitals:  Filed Vitals:   04/14/15 0930  BP: 126/89  Pulse: 53  Temp: 36.4 C  Resp: 14    Complications: No apparent anesthesia complications

## 2015-04-14 NOTE — Transfer of Care (Signed)
Immediate Anesthesia Transfer of Care Note  Patient: [name] Best  Procedure(s) Performed: Procedure(s): KNEE ARTHROSCOPY WITH MEDIAL MENISECTOMY (Left)  Patient Location: PACU  Anesthesia Type:General  Level of Consciousness: awake, oriented and patient cooperative  Airway & Oxygen Therapy: Patient Spontanous Breathing and Patient connected to face mask oxygen  Post-op Assessment: Report given to RN and Post -op Vital signs reviewed and stable  Post vital signs: Reviewed and stable  Last Vitals:  Filed Vitals:   04/14/15 0715  BP: 123/81  Pulse:   Temp:   Resp: 21    Complications: No apparent anesthesia complications

## 2015-04-14 NOTE — Anesthesia Procedure Notes (Signed)
Procedure Name: LMA Insertion Date/Time: 04/14/2015 7:36 AM Performed by: Andree Elk, Darrious Youman A Pre-anesthesia Checklist: Patient identified, Emergency Drugs available, Suction available, Patient being monitored and Timeout performed Patient Re-evaluated:Patient Re-evaluated prior to inductionOxygen Delivery Method: Circle system utilized Preoxygenation: Pre-oxygenation with 100% oxygen Intubation Type: IV induction Ventilation: Mask ventilation without difficulty LMA: LMA inserted LMA Size: 5.0 Number of attempts: 1 Placement Confirmation: positive ETCO2 and breath sounds checked- equal and bilateral Tube secured with: Tape Dental Injury: Teeth and Oropharynx as per pre-operative assessment

## 2015-04-14 NOTE — Brief Op Note (Signed)
04/14/2015  8:23 AM  PATIENT:  [name] Best  54 y.o. male  PRE-OPERATIVE DIAGNOSIS:  left knee medial meniscus tear  POST-OPERATIVE DIAGNOSIS:  left knee medial meniscus tear  PROCEDURE:  Procedure(s): LEFT KNEE ARTHROSCOPY (Left) partial medial menisectomy  SURGEON:  Surgeon(s) and Role:    * Sanjuana Kava, MD - Primary  PHYSICIAN ASSISTANT:   ASSISTANTS: none   ANESTHESIA:   general  EBL:  Total I/O In: 700 [I.V.:700] Out: 5 [Blood:5]  BLOOD ADMINISTERED:none  DRAINS: none   LOCAL MEDICATIONS USED:  MARCAINE     SPECIMEN:  Source of Specimen:  left knee medial meniscus  DISPOSITION OF SPECIMEN:  PATHOLOGY  COUNTS:  YES  TOURNIQUET:   Total Tourniquet Time Documented: Thigh (Left) - 20 minutes Total: Thigh (Left) - 20 minutes   DICTATION: .Other Dictation: Dictation Number [id number]  PLAN OF CARE: Discharge to home after PACU  PATIENT DISPOSITION:  PACU - hemodynamically stable.   Delay start of Pharmacological VTE agent (>24hrs) due to surgical blood loss or risk of bleeding: not applicable

## 2015-04-14 NOTE — Op Note (Signed)
[name] Best, [name] Best               ACCOUNT NO.:  [id number]  MEDICAL RECORD NO.:  [id number]  LOCATION:  [facility]                          FACILITY:  [facility]  PHYSICIAN:  J. Sanjuana Kava, M.D. DATE OF BIRTH:  04-Apr-1961  DATE OF PROCEDURE:  04/14/2015 DATE OF DISCHARGE:                              OPERATIVE REPORT   PREOPERATIVE DIAGNOSIS:  Tear of the medial meniscus of the left knee.  POSTOPERATIVE DIAGNOSIS:  Tear of the medial meniscus of the left knee.  PROCEDURE:  Operative arthroscopy, partial medial meniscectomy, left knee.  ANESTHESIA:  General.  SURGEON:  J. Sanjuana Kava, M.D.  TOURNIQUET TIME:  20 minutes.  DRAINS:  None.  INDICATIONS:  The patient has been having pain and tenderness in his left knee for several months.  It has gotten progressively worse. Appears not improved with conservative treatment, started to have more swelling and popping and then giving way.  MRI done on February 23, 2015, showed a tear of the medial meniscus of the left knee.  I recommended arthroscopy of the knee.  I went over the risks and imponderables of the elected procedure.  He asked appropriate questions. He appeared to understand.  He has elected to have the knee surgery done at this time.  He understands this will not cure or correct any arthritis of the knee, and he will need physical therapy postoperatively.  DESCRIPTION OF PROCEDURE:  The patient was seen in the holding area, and the left knee was identified as correct surgical site.  He placed a mark on the left knee.  I placed a mark on the left knee.  The patient was brought to the operating room and placed supine on the operating room table.  He was given general anesthesia.  Leg holder and tourniquet placed, deflated left upper thigh.  He was prepped and draped in usual manner.  A generalized time-out identifying the patient as [name] Best and doing left knee arthroscopy.  All instrumentation was properly positioned  and working.  The OR team knew each other.  The marks are visible on the leg.  He was prepped and draped.  The leg was elevated, wrapped circumferentially with an Esmarch bandage.  Tourniquet inflated to 300 mmHg.  An Esmarch bandage removed.  Inflow cannula inserted into the medial aspect of the left knee.  Lactated Ringer's was infused by infusion pump.  Arthroscope inserted laterally, the knee systematically examined.  Permanent pictures were taken of the procedure. Suprapatellar pouch looked normal.  Undersurface of surface of patella with grade 2 changes.  Medially, there was a tear of the medial meniscus.  The posterior horn more of a stellate diffuse type tear, and there were grade 2 to early grade 3 changes of the tibial plateau and distal femoral condyle.  Anterior cruciate was intact and laterally the meniscus looked normal with only grade 2 changes laterally.  There were no loose bodies or loose fragments.  Attention directed back to the medial side.  Using a meniscal probe, meniscal punch, and meniscal shaver; the meniscus was smoothed and the tear was removed.  Permanent pictures were taken.  The knee was systematically reexamined and no pathology found.  Knee  was irrigated with main part of lactated Ringer's.  Wound was reapproximated using 3-0 nylon interrupted vertical mattress manner.  Marcaine 0.25% instilled in each portal.  Tourniquet deflated after 20 minutes and a bulky dressing applied.  The patient tolerated the procedure well and will go to the recovery in good condition.  He is an outpatient.          ______________________________ Lenna Sciara. Sanjuana Kava, M.D.     JWK/MEDQ  D:  04/14/2015  T:  04/14/2015  Job:  [id number]

## 2015-04-14 NOTE — Discharge Instructions (Signed)
You should use crutches or walker as needed.  You may weight bear as tolerated.  You can remove dressing from the left knee when you desire.  You may cleanse with peroxide.  Otherwise keep wounds dry.  You may apply Band-aid over the stitches.  Keep physical therapy appointments.  See Dr. Luna Glasgow in about ten days to two weeks.  If any problem, call his office at [phone] or, if after hours, the hospital at [phone].

## 2015-04-15 ENCOUNTER — Encounter (HOSPITAL_COMMUNITY): Payer: Self-pay | Admitting: Orthopaedic Surgery

## 2015-08-21 ENCOUNTER — Emergency Department (HOSPITAL_COMMUNITY)
Admission: EM | Admit: 2015-08-21 | Discharge: 2015-08-21 | Disposition: A | Discharge status: Home / Self Care | Payer: Commercial Managed Care - HMO | Attending: Emergency Medicine | Admitting: Emergency Medicine

## 2015-08-21 ENCOUNTER — Encounter (HOSPITAL_COMMUNITY): Payer: Self-pay | Admitting: *Deleted

## 2015-08-21 ENCOUNTER — Emergency Department (HOSPITAL_COMMUNITY): Payer: Commercial Managed Care - HMO

## 2015-08-21 DIAGNOSIS — F419 Anxiety disorder, unspecified: Secondary | ICD-10-CM | POA: Insufficient documentation

## 2015-08-21 DIAGNOSIS — G8929 Other chronic pain: Secondary | ICD-10-CM | POA: Diagnosis not present

## 2015-08-21 DIAGNOSIS — Z8619 Personal history of other infectious and parasitic diseases: Secondary | ICD-10-CM | POA: Diagnosis not present

## 2015-08-21 DIAGNOSIS — R079 Chest pain, unspecified: Secondary | ICD-10-CM | POA: Diagnosis not present

## 2015-08-21 DIAGNOSIS — Z8719 Personal history of other diseases of the digestive system: Secondary | ICD-10-CM | POA: Diagnosis not present

## 2015-08-21 DIAGNOSIS — R5383 Other fatigue: Secondary | ICD-10-CM | POA: Diagnosis present

## 2015-08-21 DIAGNOSIS — Z8639 Personal history of other endocrine, nutritional and metabolic disease: Secondary | ICD-10-CM | POA: Insufficient documentation

## 2015-08-21 LAB — COMPREHENSIVE METABOLIC PANEL
ALT: 19 U/L (ref 17–63)
AST: 19 U/L (ref 15–41)
Albumin: 3.9 g/dL (ref 3.5–5.0)
Alkaline Phosphatase: 82 U/L (ref 38–126)
Anion gap: 8 (ref 5–15)
BILIRUBIN TOTAL: 0.7 mg/dL (ref 0.3–1.2)
BUN: 14 mg/dL (ref 6–20)
CO2: 27 mmol/L (ref 22–32)
Calcium: 8.6 mg/dL — ABNORMAL LOW (ref 8.9–10.3)
Chloride: 103 mmol/L (ref 101–111)
Creatinine, Ser: 1.23 mg/dL (ref 0.61–1.24)
Glucose, Bld: 167 mg/dL — ABNORMAL HIGH (ref 65–99)
POTASSIUM: 3.6 mmol/L (ref 3.5–5.1)
Sodium: 138 mmol/L (ref 135–145)
TOTAL PROTEIN: 7.2 g/dL (ref 6.5–8.1)

## 2015-08-21 LAB — CBC WITH DIFFERENTIAL/PLATELET
BASOS ABS: 0 10*3/uL (ref 0.0–0.1)
Basophils Relative: 1 % (ref 0–1)
Eosinophils Absolute: 0.2 10*3/uL (ref 0.0–0.7)
Eosinophils Relative: 3 % (ref 0–5)
HCT: 47.4 % (ref 39.0–52.0)
HEMOGLOBIN: 15.4 g/dL (ref 13.0–17.0)
LYMPHS ABS: 1.2 10*3/uL (ref 0.7–4.0)
LYMPHS PCT: 15 % (ref 12–46)
MCH: 29.1 pg (ref 26.0–34.0)
MCHC: 32.5 g/dL (ref 30.0–36.0)
MCV: 89.6 fL (ref 78.0–100.0)
Monocytes Absolute: 0.5 10*3/uL (ref 0.1–1.0)
Monocytes Relative: 6 % (ref 3–12)
NEUTROS PCT: 75 % (ref 43–77)
Neutro Abs: 6.1 10*3/uL (ref 1.7–7.7)
Platelets: 231 10*3/uL (ref 150–400)
RBC: 5.29 MIL/uL (ref 4.22–5.81)
RDW: 14.1 % (ref 11.5–15.5)
WBC: 8.1 10*3/uL (ref 4.0–10.5)

## 2015-08-21 LAB — TROPONIN I: Troponin I: <0.03 ng/mL (ref <0.031)

## 2015-08-21 NOTE — Discharge Instructions (Signed)

## 2015-08-21 NOTE — ED Provider Notes (Signed)
CSN: [id number]     Arrival date & time 08/21/15  0712 History   First MD Initiated Contact with Patient 08/21/15 [phone]     Chief Complaint  Patient presents with  . Fatigue     (Consider location/radiation/quality/duration/timing/severity/associated sxs/prior Treatment) HPI Comments: Pt comes in with c/o sweats with waking up in the morning over the last 6 months. He states that he intermittently has cp that lasts about 1 minute and resolves on its own. He states that he had it yesterday a then he also at a separate time had pain in her left forearm with some tingling. Denies sob. States that he does have and intermittent cough. He denies any cardiac history of his own. He father died at 85 of an mi. He saw cardiology about 10 years ago for a baseline check because of his family history but has not been seen since. He state that he has had a lot of nausea. He states that he has been really stressed out with work and he is not sure if this is related. He states that sometimes when he has diverticulitis he has sweats but he doesn't feel like this is diverticulitis  The history is provided by the patient. No language interpreter was used.    Past Medical History  Diagnosis Date  . Diverticulitis     hospitalized in 2003  . Stomach pain   . Abdominal pain, left lower quadrant   . Chronic back pain   . Hyperlipidemia   . Anxiety   . PONV (postoperative nausea and vomiting)   . C. difficile diarrhea 2014    treated with Flagyl   Past Surgical History  Procedure Laterality Date  . Gallbladder surgery    . Left elbow    . Colonoscopy N/A 07/02/2013    Jenkins:Sessile polyp (tubular adenoma) ranging between 3-45mm in size in the proximal sigmoid colon/mild diverticulosis   . Esophagogastroduodenoscopy N/A 07/02/2013    Jenkins:4 cm hiatal hernia/esophagus appeared normal/chronic gastritis. Clotest negative.  . Colonoscopy      Dr. Laural Golden: prior to 2003 per patient  . Knee arthroscopy with  medial menisectomy Left 04/14/2015    Procedure: KNEE ARTHROSCOPY WITH MEDIAL MENISECTOMY;  Surgeon: Sanjuana Kava, MD;  Location: [facility];  Service: Orthopedics;  Laterality: Left;   Family History  Problem Relation Age of Onset  . Heart disease    . Colon cancer Neg Hx   . Colon polyps Neg Hx    Social History  Substance Use Topics  . Smoking status: Never Smoker   . Smokeless tobacco: None  . Alcohol Use: No    Review of Systems  All other systems reviewed and are negative.     Allergies  Tetracyclines & related  Home Medications   Prior to Admission medications   Medication Sig Start Date End Date Taking? Authorizing Provider  ciprofloxacin (CIPRO) 500 MG tablet Take 500 mg by mouth as needed (for diverticulitis).     Historical Provider, MD  diazepam (VALIUM) 10 MG tablet Take 10 mg by mouth every 6 (six) hours as needed for anxiety.     Historical Provider, MD  diphenoxylate-atropine (LOMOTIL) 2.5-0.025 MG per tablet Take 1 tablet by mouth 4 (four) times daily as needed for diarrhea or loose stools.    Historical Provider, MD  HYDROcodone-acetaminophen (NORCO) 7.5-325 MG per tablet Take 1 tablet by mouth every 4 (four) hours as needed for moderate pain. 04/14/15   Sanjuana Kava, MD  hyoscyamine (LEVBID) 0.375 MG 12  hr tablet Take 1 tablet (0.375 mg total) by mouth every 12 (twelve) hours as needed (abdominal pain, loose stool). Patient not taking: Reported on 03/31/2015 08/09/13   Mahala Menghini, PA-C  metroNIDAZOLE (FLAGYL) 500 MG tablet Take 500 mg by mouth as needed (for diverticulitis).     Historical Provider, MD  omeprazole (PRILOSEC) 20 MG capsule Take 1 capsule (20 mg total) by mouth daily. Patient not taking: Reported on 03/31/2015 07/02/13   Aviva Signs, MD   BP 151/89 mmHg  Pulse 59  Temp(Src) 97.7 F (36.5 C) (Oral)  Resp 12  Ht 6\' 3"  (1.905 m)  Wt 260 lb (117.935 kg)  BMI 32.50 kg/m2  SpO2 98% Physical Exam  Constitutional: He is oriented to person, place,  and time. He appears well-developed and well-nourished.  HENT:  Head: Normocephalic and atraumatic.  Cardiovascular: Normal rate and regular rhythm.   Pulmonary/Chest: Effort normal and breath sounds normal.  Abdominal: Soft. Bowel sounds are normal. There is no tenderness.  Musculoskeletal: Normal range of motion.  Neurological: He is alert and oriented to person, place, and time.  Skin: Skin is warm and dry.  Psychiatric: He has a normal mood and affect.  Nursing note and vitals reviewed.   ED Course  Procedures (including critical care time) Labs Review Labs Reviewed  COMPREHENSIVE METABOLIC PANEL  CBC WITH DIFFERENTIAL/PLATELET  TROPONIN I    Imaging Review No results found. I have personally reviewed and evaluated these images and lab results as part of my medical decision-making.   EKG Interpretation   Date/Time:  Friday August 21 2015 07:28:40 EDT Ventricular Rate:  62 PR Interval:  192 QRS Duration: 97 QT Interval:  440 QTC Calculation: 447 R Axis:   -91 Text Interpretation:  Sinus rhythm Left anterior fascicular block Consider  right ventricular hypertrophy Probable anteroseptal infarct, old Confirmed  by MESNER MD, Corene Cornea [phone]) on 08/21/2015 7:43:12 AM      MDM   Final diagnoses:  Chest pain, unspecified chest pain type    Doubt cardiac in nature although discussed seeing cardiology  Because of the family history. Pt states that he is going to see his mothers cardiologist. Pt is cp free    Glendell Docker, NP 08/21/15 [phone]  Merrily Pew, MD 08/21/15 [phone]

## 2015-08-21 NOTE — ED Notes (Signed)
Pt states he has been waking up in the mornings with "sweats" some days over past 6 mo. Pt states intermittent CP x 1 month. Last had CP yesterday. Pt states that pain usually only lasts for a minute. Pt states "Dull, hard pain, maybe sharp". Also states intermittent tingling down left arm x 1 mo which does not occur with CP. Pt states hx of surgery to left arm. Pt states he has been under a lot of stress lately and he is concerned because his father died at 55 from MI.

## 2015-09-09 ENCOUNTER — Encounter: Payer: Self-pay | Admitting: Cardiology

## 2015-09-09 ENCOUNTER — Ambulatory Visit (INDEPENDENT_AMBULATORY_CARE_PROVIDER_SITE_OTHER): Payer: Commercial Managed Care - HMO | Admitting: Cardiology

## 2015-09-09 VITALS — BP 130/80 | HR 68 | Ht 75.5 in | Wt 262.3 lb

## 2015-09-09 DIAGNOSIS — E785 Hyperlipidemia, unspecified: Secondary | ICD-10-CM

## 2015-09-09 DIAGNOSIS — R0600 Dyspnea, unspecified: Secondary | ICD-10-CM

## 2015-09-09 NOTE — Addendum Note (Signed)
Addended by: Vennie Homans on: 09/09/2015 10:15 AM   Modules accepted: Medications

## 2015-09-09 NOTE — Progress Notes (Signed)
Cardiology Office Note   Date:  09/09/2015   ID:  [name] Best, DOB 03-Nov-1961, MRN [id number]  PCP:  [name], MD  Cardiologist:   Minus Breeding, MD   Chief Complaint  Patient presents with  . New Post Hosp    no chest pain, SOB or leg swelling      History of Present Illness: [name] Best is a 54 y.o. male who presents for evaluation of some chest pain. He was in the hospital emergency room at the end of August with this. He described atypical symptoms but doesn't describe it currently. He does have significant risk factors with a strong family history of early coronary disease. He was concerned about this. He's also been having some night sweats occasionally.  He works an active job and does some yard work but he does not exercise.  The patient denies any new symptoms such as neck or arm discomfort. There has been no new shortness of breath, PND or orthopnea. There have been no reported palpitations, presyncope or syncope.   Past Medical History  Diagnosis Date  . Diverticulitis     hospitalized in 2003  . Chronic back pain   . Hyperlipidemia   . Anxiety   . PONV (postoperative nausea and vomiting)   . C. difficile diarrhea 2014    treated with Flagyl    Past Surgical History  Procedure Laterality Date  . Gallbladder surgery    . Left elbow    . Colonoscopy N/A 07/02/2013    Jenkins:Sessile polyp (tubular adenoma) ranging between 3-19mm in size in the proximal sigmoid colon/mild diverticulosis   . Esophagogastroduodenoscopy N/A 07/02/2013    Jenkins:4 cm hiatal hernia/esophagus appeared normal/chronic gastritis. Clotest negative.  . Colonoscopy      Dr. Laural Golden: prior to 2003 per patient  . Knee arthroscopy with medial menisectomy Left 04/14/2015    Procedure: KNEE ARTHROSCOPY WITH MEDIAL MENISECTOMY;  Surgeon: Sanjuana Kava, MD;  Location: [facility];  Service: Orthopedics;  Laterality: Left;     Current Outpatient Prescriptions  Medication Sig Dispense  Refill  . ciprofloxacin (CIPRO) 500 MG tablet Take 500 mg by mouth as needed (for diverticulitis).     . diazepam (VALIUM) 10 MG tablet Take 10 mg by mouth every 6 (six) hours as needed for anxiety.     . diphenoxylate-atropine (LOMOTIL) 2.5-0.025 MG per tablet Take 1 tablet by mouth 4 (four) times daily as needed for diarrhea or loose stools.    . metroNIDAZOLE (FLAGYL) 500 MG tablet Take 500 mg by mouth as needed (for diverticulitis).      No current facility-administered medications for this visit.    Allergies:   Tetracyclines & related    Social History:  The patient  reports that he has never smoked. He does not have any smokeless tobacco history on file. He reports that he does not drink alcohol or use illicit drugs.   Family History:  The patient's family history includes Heart attack (age of onset: 72) in his father; Heart disease in an other family member; Heart failure (age of onset: 62) in his mother. There is no history of Colon cancer or Colon polyps.    ROS:  Please see the history of present illness.   Otherwise, review of systems are positive for none.   All other systems are reviewed and negative.    PHYSICAL EXAM: VS:  BP 130/80 mmHg  Pulse 68  Ht 6' 3.5" (1.918 m)  Wt 262 lb  4.8 oz (118.978 kg)  BMI 32.34 kg/m2 , BMI Body mass index is 32.34 kg/(m^2). GENERAL:  Well appearing HEENT:  Pupils equal round and reactive, fundi not visualized, oral mucosa unremarkable NECK:  No jugular venous distention, waveform within normal limits, carotid upstroke brisk and symmetric, no bruits, no thyromegaly LYMPHATICS:  No cervical, inguinal adenopathy LUNGS:  Clear to auscultation bilaterally BACK:  No CVA tenderness CHEST:  Unremarkable HEART:  PMI not displaced or sustained,S1 and S2 within normal limits, no S3, no S4, no clicks, no rubs, no murmurs ABD:  Flat, positive bowel sounds normal in frequency in pitch, no bruits, no rebound, no guarding, no midline pulsatile mass,  no hepatomegaly, no splenomegaly EXT:  2 plus pulses throughout, no edema, no cyanosis no clubbing SKIN:  No rashes no nodules NEURO:  Cranial nerves II through XII grossly intact, motor grossly intact throughout PSYCH:  Cognitively intact, oriented to person place and time    EKG:  EKG is not ordered today. The ekg ordered 8/26demonstrates sinus rhythm, rate 62, left axis deviation, left anterior fascicular block, poor anterior R wave progression   Recent Labs: 08/21/2015: ALT 19; BUN 14; Creatinine, Ser 1.23; Hemoglobin 15.4; Platelets 231; Potassium 3.6; Sodium 138    Lipid Panel No results found for: CHOL, TRIG, HDL, CHOLHDL, VLDL, LDLCALC, LDLDIRECT    Wt Readings from Last 3 Encounters:  09/09/15 262 lb 4.8 oz (118.978 kg)  08/21/15 260 lb (117.935 kg)  04/09/15 273 lb (123.832 kg)      Other studies Reviewed: Additional studies/ records that were reviewed today include: ED records. Review of the above records demonstrates:  Please see elsewhere in the note.     ASSESSMENT AND PLAN:  CHEST PAIN:  I will bring the patient back for a POET (Plain Old Exercise Test). This will allow me to screen for obstructive coronary disease, risk stratify and very importantly provide a prescription for exercise.  DYSLIPIDEMIA:  He will have a lipid profile today.    OBESITY:  The patient understands the need to lose weight with diet and exercise. We have discussed specific strategies for this.    Current medicines are reviewed at length with the patient today.  The patient does not have concerns regarding medicines.  The following changes have been made:  no change  Labs/ tests ordered today include:   Orders Placed This Encounter  Procedures  . Lipid Profile  . Exercise Tolerance Test     Disposition:   FU with me as needed.      Signed, Minus Breeding, MD  09/09/2015 10:03 AM    [place]

## 2015-09-09 NOTE — Patient Instructions (Signed)
Your physician recommends that you schedule a follow-up appointment in: AS Needed  Your physician has requested that you have an exercise tolerance test. For further information please visit [email or web address]. Please also follow instruction sheet, as given.  Your physician recommends that you return for lab work in Today fasting lipids

## 2015-09-10 LAB — LIPID PANEL
Cholesterol: 239 mg/dL — ABNORMAL HIGH (ref 125–200)
HDL: 40 mg/dL (ref >=40)
LDL Cholesterol: 173 mg/dL — ABNORMAL HIGH (ref <130)
Total CHOL/HDL Ratio: 6 Ratio — ABNORMAL HIGH (ref <=5.0)
Triglycerides: 128 mg/dL (ref <150)
VLDL: 26 mg/dL (ref <30)

## 2015-10-05 ENCOUNTER — Other Ambulatory Visit (HOSPITAL_COMMUNITY): Payer: Self-pay | Admitting: Family Medicine

## 2015-10-05 ENCOUNTER — Telehealth: Payer: Self-pay | Admitting: Cardiology

## 2015-10-05 ENCOUNTER — Other Ambulatory Visit (HOSPITAL_COMMUNITY): Payer: Self-pay | Admitting: Physician Assistant

## 2015-10-05 DIAGNOSIS — M5136 Other intervertebral disc degeneration, lumbar region: Secondary | ICD-10-CM

## 2015-10-05 DIAGNOSIS — M5410 Radiculopathy, site unspecified: Secondary | ICD-10-CM

## 2015-10-06 NOTE — Telephone Encounter (Signed)
Close encounter 

## 2015-10-08 ENCOUNTER — Ambulatory Visit (HOSPITAL_COMMUNITY)
Admission: RE | Admit: 2015-10-08 | Discharge: 2015-10-08 | Disposition: A | Discharge status: Home / Self Care | Payer: Commercial Managed Care - HMO | Source: Ambulatory Visit | Attending: Physician Assistant | Admitting: Physician Assistant

## 2015-10-08 DIAGNOSIS — M5136 Other intervertebral disc degeneration, lumbar region: Secondary | ICD-10-CM | POA: Insufficient documentation

## 2015-10-08 DIAGNOSIS — M5126 Other intervertebral disc displacement, lumbar region: Secondary | ICD-10-CM | POA: Diagnosis not present

## 2015-10-08 DIAGNOSIS — M5410 Radiculopathy, site unspecified: Secondary | ICD-10-CM | POA: Diagnosis present

## 2015-10-08 DIAGNOSIS — M4806 Spinal stenosis, lumbar region: Secondary | ICD-10-CM | POA: Diagnosis not present

## 2015-10-14 ENCOUNTER — Inpatient Hospital Stay (HOSPITAL_COMMUNITY): Admission: RE | Admit: 2015-10-14 | Payer: Commercial Managed Care - HMO | Source: Ambulatory Visit

## 2015-10-14 ENCOUNTER — Other Ambulatory Visit (HOSPITAL_COMMUNITY): Payer: Self-pay | Admitting: Physician Assistant

## 2015-10-14 DIAGNOSIS — M5136 Other intervertebral disc degeneration, lumbar region: Secondary | ICD-10-CM

## 2015-10-23 ENCOUNTER — Other Ambulatory Visit (HOSPITAL_COMMUNITY): Payer: Commercial Managed Care - HMO

## 2015-10-29 ENCOUNTER — Other Ambulatory Visit (HOSPITAL_COMMUNITY): Payer: Commercial Managed Care - HMO

## 2015-11-24 ENCOUNTER — Inpatient Hospital Stay (HOSPITAL_COMMUNITY): Admission: RE | Admit: 2015-11-24 | Payer: Commercial Managed Care - HMO | Source: Ambulatory Visit

## 2015-11-26 ENCOUNTER — Other Ambulatory Visit (INDEPENDENT_AMBULATORY_CARE_PROVIDER_SITE_OTHER): Payer: Self-pay | Admitting: Otolaryngology

## 2015-11-26 DIAGNOSIS — R221 Localized swelling, mass and lump, neck: Secondary | ICD-10-CM

## 2015-12-02 ENCOUNTER — Ambulatory Visit (HOSPITAL_COMMUNITY)
Admission: RE | Admit: 2015-12-02 | Discharge: 2015-12-02 | Disposition: A | Discharge status: Home / Self Care | Payer: Commercial Managed Care - HMO | Source: Ambulatory Visit | Attending: Otolaryngology | Admitting: Otolaryngology

## 2015-12-02 DIAGNOSIS — R221 Localized swelling, mass and lump, neck: Secondary | ICD-10-CM | POA: Insufficient documentation

## 2015-12-02 MED ORDER — IOHEXOL 300 MG/ML  SOLN
75.0000 mL | Freq: Once | INTRAMUSCULAR | Status: AC | PRN
  Administered 2015-12-02: 75 mL via INTRAVENOUS

## 2015-12-04 ENCOUNTER — Other Ambulatory Visit: Payer: Self-pay | Admitting: Otolaryngology

## 2015-12-07 ENCOUNTER — Encounter (HOSPITAL_BASED_OUTPATIENT_CLINIC_OR_DEPARTMENT_OTHER): Payer: Self-pay | Admitting: *Deleted

## 2015-12-10 ENCOUNTER — Ambulatory Visit (HOSPITAL_BASED_OUTPATIENT_CLINIC_OR_DEPARTMENT_OTHER): Payer: Commercial Managed Care - HMO | Admitting: Certified Registered"

## 2015-12-10 ENCOUNTER — Encounter (HOSPITAL_BASED_OUTPATIENT_CLINIC_OR_DEPARTMENT_OTHER): Payer: Self-pay | Admitting: Certified Registered"

## 2015-12-10 ENCOUNTER — Ambulatory Visit (HOSPITAL_BASED_OUTPATIENT_CLINIC_OR_DEPARTMENT_OTHER)
Admission: RE | Admit: 2015-12-10 | Discharge: 2015-12-10 | Disposition: A | Discharge status: Home / Self Care | Payer: Commercial Managed Care - HMO | Source: Ambulatory Visit | Attending: Otolaryngology | Admitting: Otolaryngology

## 2015-12-10 ENCOUNTER — Encounter (HOSPITAL_BASED_OUTPATIENT_CLINIC_OR_DEPARTMENT_OTHER)
Admission: RE | Disposition: A | Discharge status: Home / Self Care | Payer: Self-pay | Source: Ambulatory Visit | Attending: Otolaryngology

## 2015-12-10 DIAGNOSIS — Z881 Allergy status to other antibiotic agents status: Secondary | ICD-10-CM | POA: Insufficient documentation

## 2015-12-10 DIAGNOSIS — F419 Anxiety disorder, unspecified: Secondary | ICD-10-CM | POA: Insufficient documentation

## 2015-12-10 DIAGNOSIS — C490 Malignant neoplasm of connective and soft tissue of head, face and neck: Secondary | ICD-10-CM | POA: Diagnosis not present

## 2015-12-10 DIAGNOSIS — K219 Gastro-esophageal reflux disease without esophagitis: Secondary | ICD-10-CM | POA: Diagnosis not present

## 2015-12-10 DIAGNOSIS — R221 Localized swelling, mass and lump, neck: Secondary | ICD-10-CM | POA: Diagnosis present

## 2015-12-10 DIAGNOSIS — C01 Malignant neoplasm of base of tongue: Secondary | ICD-10-CM | POA: Insufficient documentation

## 2015-12-10 HISTORY — PX: DIRECT LARYNGOSCOPY: SHX5326

## 2015-12-10 HISTORY — PX: MASS BIOPSY: SHX5445

## 2015-12-10 HISTORY — PX: TONGUE BIOPSY: SHX6575

## 2015-12-10 LAB — POCT HEMOGLOBIN-HEMACUE: Hemoglobin: 15.6 g/dL (ref 13.0–17.0)

## 2015-12-10 SURGERY — LARYNGOSCOPY, DIRECT
Anesthesia: General | Laterality: Right

## 2015-12-10 MED ORDER — OXYCODONE-ACETAMINOPHEN 5-325 MG PO TABS: 1.0000 | ORAL_TABLET | ORAL | Status: DC | PRN

## 2015-12-10 MED ORDER — AMOXICILLIN 875 MG PO TABS: 875.0000 mg | ORAL_TABLET | Freq: Two times a day (BID) | ORAL | Status: DC

## 2015-12-10 MED ORDER — HEMOSTATIC AGENTS (NO CHARGE) OPTIME
TOPICAL | Status: DC | PRN
  Administered 2015-12-10: 1 via TOPICAL

## 2015-12-10 MED ORDER — SODIUM CHLORIDE 0.9 % IJ SOLN
INTRAMUSCULAR | Status: AC
  Filled 2015-12-10: qty 10

## 2015-12-10 MED ORDER — DEXAMETHASONE SODIUM PHOSPHATE 10 MG/ML IJ SOLN
INTRAMUSCULAR | Status: AC
  Filled 2015-12-10: qty 1

## 2015-12-10 MED ORDER — MIDAZOLAM HCL 2 MG/2ML IJ SOLN
INTRAMUSCULAR | Status: AC
  Filled 2015-12-10: qty 2

## 2015-12-10 MED ORDER — OXYCODONE HCL 5 MG/5ML PO SOLN: 5.0000 mg | Freq: Once | ORAL | Status: DC | PRN

## 2015-12-10 MED ORDER — FENTANYL CITRATE (PF) 100 MCG/2ML IJ SOLN
INTRAMUSCULAR | Status: AC
  Filled 2015-12-10: qty 2

## 2015-12-10 MED ORDER — SUCCINYLCHOLINE CHLORIDE 20 MG/ML IJ SOLN
INTRAMUSCULAR | Status: AC
  Filled 2015-12-10: qty 1

## 2015-12-10 MED ORDER — CEFAZOLIN SODIUM-DEXTROSE 2-3 GM-% IV SOLR
INTRAVENOUS | Status: AC
  Filled 2015-12-10: qty 50

## 2015-12-10 MED ORDER — DEXAMETHASONE SODIUM PHOSPHATE 4 MG/ML IJ SOLN
INTRAMUSCULAR | Status: DC | PRN
  Administered 2015-12-10: 10 mg via INTRAVENOUS

## 2015-12-10 MED ORDER — CEFAZOLIN SODIUM-DEXTROSE 2-3 GM-% IV SOLR
INTRAVENOUS | Status: DC | PRN
  Administered 2015-12-10: 2 g via INTRAVENOUS

## 2015-12-10 MED ORDER — LIDOCAINE-EPINEPHRINE 1 %-1:100000 IJ SOLN
INTRAMUSCULAR | Status: AC
Start: 2015-12-10 — End: 2015-12-10
  Filled 2015-12-10: qty 1

## 2015-12-10 MED ORDER — FENTANYL CITRATE (PF) 100 MCG/2ML IJ SOLN
50.0000 ug | INTRAMUSCULAR | Status: DC | PRN
  Administered 2015-12-10: 100 ug via INTRAVENOUS
  Administered 2015-12-10: 25 ug via INTRAVENOUS

## 2015-12-10 MED ORDER — GLYCOPYRROLATE 0.2 MG/ML IJ SOLN: 0.2000 mg | Freq: Once | INTRAMUSCULAR | Status: DC | PRN

## 2015-12-10 MED ORDER — SUCCINYLCHOLINE CHLORIDE 20 MG/ML IJ SOLN
INTRAMUSCULAR | Status: DC | PRN
  Administered 2015-12-10: 100 mg via INTRAVENOUS

## 2015-12-10 MED ORDER — MIDAZOLAM HCL 2 MG/2ML IJ SOLN
1.0000 mg | INTRAMUSCULAR | Status: DC | PRN
  Administered 2015-12-10: 2 mg via INTRAVENOUS

## 2015-12-10 MED ORDER — EPINEPHRINE HCL 1 MG/ML IJ SOLN
INTRAMUSCULAR | Status: DC | PRN
  Administered 2015-12-10: 1 mg

## 2015-12-10 MED ORDER — LIDOCAINE HCL (CARDIAC) 20 MG/ML IV SOLN
INTRAVENOUS | Status: AC
  Filled 2015-12-10: qty 5

## 2015-12-10 MED ORDER — PROPOFOL 10 MG/ML IV BOLUS
INTRAVENOUS | Status: DC | PRN
  Administered 2015-12-10: 200 mg via INTRAVENOUS

## 2015-12-10 MED ORDER — LIDOCAINE HCL (CARDIAC) 20 MG/ML IV SOLN
INTRAVENOUS | Status: DC | PRN
  Administered 2015-12-10: 60 mg via INTRAVENOUS

## 2015-12-10 MED ORDER — OXYMETAZOLINE HCL 0.05 % NA SOLN
NASAL | Status: AC
  Filled 2015-12-10: qty 15

## 2015-12-10 MED ORDER — HYDROMORPHONE HCL 1 MG/ML IJ SOLN
INTRAMUSCULAR | Status: AC
  Filled 2015-12-10: qty 1

## 2015-12-10 MED ORDER — SCOPOLAMINE 1 MG/3DAYS TD PT72: 1.0000 | MEDICATED_PATCH | Freq: Once | TRANSDERMAL | Status: DC

## 2015-12-10 MED ORDER — ONDANSETRON HCL 4 MG/2ML IJ SOLN
INTRAMUSCULAR | Status: AC
  Filled 2015-12-10: qty 2

## 2015-12-10 MED ORDER — PROMETHAZINE HCL 25 MG/ML IJ SOLN
6.2500 mg | INTRAMUSCULAR | Status: DC | PRN
  Administered 2015-12-10: 6.25 mg via INTRAVENOUS

## 2015-12-10 MED ORDER — OXYCODONE HCL 5 MG PO TABS: 5.0000 mg | ORAL_TABLET | Freq: Once | ORAL | Status: DC | PRN

## 2015-12-10 MED ORDER — PROMETHAZINE HCL 25 MG/ML IJ SOLN
INTRAMUSCULAR | Status: AC
  Filled 2015-12-10: qty 1

## 2015-12-10 MED ORDER — LIDOCAINE-EPINEPHRINE 1 %-1:100000 IJ SOLN
INTRAMUSCULAR | Status: DC | PRN
  Administered 2015-12-10: 3 mL

## 2015-12-10 MED ORDER — BACITRACIN ZINC 500 UNIT/GM EX OINT
TOPICAL_OINTMENT | CUTANEOUS | Status: AC
  Filled 2015-12-10: qty 0.9

## 2015-12-10 MED ORDER — LACTATED RINGERS IV SOLN
INTRAVENOUS | Status: DC
  Administered 2015-12-10: 07:00:00 via INTRAVENOUS
  Administered 2015-12-10: 10 mL/h via INTRAVENOUS
  Administered 2015-12-10: 08:00:00 via INTRAVENOUS

## 2015-12-10 MED ORDER — ONDANSETRON HCL 4 MG/2ML IJ SOLN
INTRAMUSCULAR | Status: DC | PRN
  Administered 2015-12-10: 4 mg via INTRAVENOUS

## 2015-12-10 MED ORDER — HYDROMORPHONE HCL 1 MG/ML IJ SOLN
0.2500 mg | INTRAMUSCULAR | Status: DC | PRN
  Administered 2015-12-10: 0.5 mg via INTRAVENOUS

## 2015-12-10 MED ORDER — PROPOFOL 500 MG/50ML IV EMUL
INTRAVENOUS | Status: AC
  Filled 2015-12-10: qty 50

## 2015-12-10 SURGICAL SUPPLY — 82 items
ADAPTER TUBE FLEX ULTRASET (MISCELLANEOUS) IMPLANT
BENZOIN TINCTURE PRP APPL 2/3 (GAUZE/BANDAGES/DRESSINGS) IMPLANT
BLADE SURG 15 STRL LF DISP TIS (BLADE) ×2 IMPLANT
BLADE SURG 15 STRL SS (BLADE) ×2
CANISTER SUCT 1200ML W/VALVE (MISCELLANEOUS) ×4 IMPLANT
CLEANER CAUTERY TIP 5X5 PAD (MISCELLANEOUS) IMPLANT
CLIP TI MEDIUM 6 (CLIP) IMPLANT
CLIP TI WIDE RED SMALL 6 (CLIP) IMPLANT
CLOSURE WOUND 1/4X4 (GAUZE/BANDAGES/DRESSINGS)
CORDS BIPOLAR (ELECTRODE) ×4 IMPLANT
COVER BACK TABLE 60X90IN (DRAPES) ×4 IMPLANT
COVER MAYO STAND STRL (DRAPES) ×4 IMPLANT
DECANTER SPIKE VIAL GLASS SM (MISCELLANEOUS) IMPLANT
DRAIN JACKSON RD 7FR 3/32 (WOUND CARE) IMPLANT
DRAIN PENROSE 1/4X12 LTX STRL (WOUND CARE) IMPLANT
DRAIN TLS ROUND 10FR (DRAIN) IMPLANT
DRAPE U-SHAPE 76X120 STRL (DRAPES) ×4 IMPLANT
ELECT COATED BLADE 2.86 ST (ELECTRODE) ×4 IMPLANT
ELECT NEEDLE BLADE 2-5/6 (NEEDLE) IMPLANT
ELECT PAIRED SUBDERMAL (MISCELLANEOUS)
ELECT REM PT RETURN 9FT ADLT (ELECTROSURGICAL) ×4
ELECTRODE PAIRED SUBDERMAL (MISCELLANEOUS) IMPLANT
ELECTRODE REM PT RTRN 9FT ADLT (ELECTROSURGICAL) ×2 IMPLANT
EVACUATOR SILICONE 100CC (DRAIN) IMPLANT
FORCEPS BIPOLAR SPETZLER 8 1.0 (NEUROSURGERY SUPPLIES) ×4 IMPLANT
GAUZE SPONGE 4X4 16PLY XRAY LF (GAUZE/BANDAGES/DRESSINGS) IMPLANT
GLOVE BIO SURGEON STRL SZ7.5 (GLOVE) ×8 IMPLANT
GLOVE BIOGEL PI IND STRL 6.5 (GLOVE) ×2 IMPLANT
GLOVE BIOGEL PI IND STRL 7.0 (GLOVE) ×2 IMPLANT
GLOVE BIOGEL PI INDICATOR 6.5 (GLOVE) ×2
GLOVE BIOGEL PI INDICATOR 7.0 (GLOVE) ×2
GLOVE EXAM NITRILE MD LF STRL (GLOVE) ×4 IMPLANT
GOWN STRL REUS W/ TWL LRG LVL3 (GOWN DISPOSABLE) ×4 IMPLANT
GOWN STRL REUS W/TWL LRG LVL3 (GOWN DISPOSABLE) ×4
GUARD TEETH (MISCELLANEOUS) ×4 IMPLANT
HEMOSTAT SNOW SURGICEL 2X4 (HEMOSTASIS) ×4 IMPLANT
HEMOSTAT SURGICEL .5X2 ABSORB (HEMOSTASIS) IMPLANT
LIQUID BAND (GAUZE/BANDAGES/DRESSINGS) ×4 IMPLANT
LOCATOR NERVE 3 VOLT (DISPOSABLE) IMPLANT
MARKER SKIN DUAL TIP RULER LAB (MISCELLANEOUS) ×4 IMPLANT
NEEDLE HYPO 18GX1.5 BLUNT FILL (NEEDLE) IMPLANT
NEEDLE HYPO 25X1 1.5 SAFETY (NEEDLE) ×4 IMPLANT
NEEDLE PRECISIONGLIDE 27X1.5 (NEEDLE) IMPLANT
NEEDLE SPNL 22GX7 QUINCKE BK (NEEDLE) IMPLANT
NEEDLE SPNL 25GX3.5 QUINCKE BL (NEEDLE) ×4 IMPLANT
NS IRRIG 1000ML POUR BTL (IV SOLUTION) ×4 IMPLANT
PACK BASIN DAY SURGERY FS (CUSTOM PROCEDURE TRAY) ×4 IMPLANT
PAD CLEANER CAUTERY TIP 5X5 (MISCELLANEOUS)
PATTIES SURGICAL .5 X3 (DISPOSABLE) ×4 IMPLANT
PENCIL BUTTON HOLSTER BLD 10FT (ELECTRODE) ×4 IMPLANT
PIN SAFETY STERILE (MISCELLANEOUS) IMPLANT
PROBE NERVBE PRASS .33 (MISCELLANEOUS) IMPLANT
SHEARS HARMONIC 9CM CVD (BLADE) IMPLANT
SHEET MEDIUM DRAPE 40X70 STRL (DRAPES) ×4 IMPLANT
SLEEVE SCD COMPRESS KNEE MED (MISCELLANEOUS) ×4 IMPLANT
SOLUTION BUTLER CLEAR DIP (MISCELLANEOUS) ×4 IMPLANT
SPONGE GAUZE 2X2 8PLY STER LF (GAUZE/BANDAGES/DRESSINGS)
SPONGE GAUZE 2X2 8PLY STRL LF (GAUZE/BANDAGES/DRESSINGS) IMPLANT
SPONGE GAUZE 4X4 12PLY STER LF (GAUZE/BANDAGES/DRESSINGS) ×8 IMPLANT
STAPLER VISISTAT 35W (STAPLE) IMPLANT
STRIP CLOSURE SKIN 1/4X4 (GAUZE/BANDAGES/DRESSINGS) IMPLANT
SUCTION FRAZIER TIP 10 FR DISP (SUCTIONS) IMPLANT
SURGILUBE 2OZ TUBE FLIPTOP (MISCELLANEOUS) IMPLANT
SUT ETHILON 3 0 PS 1 (SUTURE) IMPLANT
SUT ETHILON 4 0 PS 2 18 (SUTURE) IMPLANT
SUT PROLENE 5 0 P 3 (SUTURE) IMPLANT
SUT SILK 3 0 TIES 17X18 (SUTURE)
SUT SILK 3-0 18XBRD TIE BLK (SUTURE) IMPLANT
SUT SILK 4 0 TIES 17X18 (SUTURE) IMPLANT
SUT VIC AB 3-0 FS2 27 (SUTURE) IMPLANT
SUT VIC AB 4-0 P-3 18XBRD (SUTURE) IMPLANT
SUT VIC AB 4-0 P3 18 (SUTURE)
SUT VIC AB 4-0 RB1 27 (SUTURE) ×2
SUT VIC AB 4-0 RB1 27X BRD (SUTURE) ×2 IMPLANT
SUT VICRYL 4-0 PS2 18IN ABS (SUTURE) IMPLANT
SYR BULB 3OZ (MISCELLANEOUS) ×4 IMPLANT
SYR CONTROL 10ML LL (SYRINGE) ×4 IMPLANT
SYR TB 1ML LL NO SAFETY (SYRINGE) ×4 IMPLANT
TOWEL OR 17X24 6PK STRL BLUE (TOWEL DISPOSABLE) ×4 IMPLANT
TRAY DSU PREP LF (CUSTOM PROCEDURE TRAY) ×4 IMPLANT
TUBE CONNECTING 20'X1/4 (TUBING) ×2
TUBE CONNECTING 20X1/4 (TUBING) ×6 IMPLANT

## 2015-12-10 NOTE — Anesthesia Preprocedure Evaluation (Addendum)
Anesthesia Evaluation  Patient identified by MRN, date of birth, ID band Patient awake    Reviewed: Allergy & Precautions, NPO status , Patient's Chart, lab work & pertinent test results  History of Anesthesia Complications (+) PONV and history of anesthetic complications  Airway Mallampati: II  TM Distance: >3 FB Neck ROM: Full    Dental  (+) Dental Advisory Given   Pulmonary    breath sounds clear to auscultation       Cardiovascular  Rhythm:Regular Rate:Normal     Neuro/Psych PSYCHIATRIC DISORDERS Anxiety    GI/Hepatic GERD  Medicated,  Endo/Other    Renal/GU      Musculoskeletal   Abdominal   Peds  Hematology   Anesthesia Other Findings   Reproductive/Obstetrics                              Component Value Date/Time   WBC 8.1 08/21/2015 0826   RBC 5.29 08/21/2015 0826   HGB 15.4 08/21/2015 0826   HCT 47.4 08/21/2015 0826   PLT 231 08/21/2015 0826      Component Value Date/Time   NA 138 08/21/2015 0826   K 3.6 08/21/2015 0826   CL 103 08/21/2015 0826   CO2 27 08/21/2015 0826   BUN 14 08/21/2015 0826   CREATININE 1.23 08/21/2015 0826   CALCIUM 8.6* 08/21/2015 0826   ALKPHOS 82 08/21/2015 0826   AST 19 08/21/2015 0826   ALT 19 08/21/2015 0826   BILITOT 0.7 08/21/2015 0826      Anesthesia Physical Anesthesia Plan  ASA: II  Anesthesia Plan: General   Post-op Pain Management:    Induction: Intravenous  Airway Management Planned: Oral ETT  Additional Equipment:   Intra-op Plan:   Post-operative Plan: Extubation in OR  Informed Consent: I have reviewed the patients History and Physical, chart, labs and discussed the procedure including the risks, benefits and alternatives for the proposed anesthesia with the patient or authorized representative who has indicated his/her understanding and acceptance.     Plan Discussed with: CRNA  Anesthesia Plan Comments:         Anesthesia Quick Evaluation

## 2015-12-10 NOTE — Discharge Instructions (Addendum)
The patient may resume all his previous activities and diet. He is instructed to avoid lifting (greater than 20 pounds) for 5 days. He will follow-up in my office in one week. Post Anesthesia Home Care Instructions  Activity: Get plenty of rest for the remainder of the day. A responsible adult should stay with you for 24 hours following the procedure.  For the next 24 hours, DO NOT: -Drive a car -Paediatric nurse -Drink alcoholic beverages -Take any medication unless instructed by your physician -Make any legal decisions or sign important papers.  Meals: Start with liquid foods such as gelatin or soup. Progress to regular foods as tolerated. Avoid greasy, spicy, heavy foods. If nausea and/or vomiting occur, drink only clear liquids until the nausea and/or vomiting subsides. Call your physician if vomiting continues.  Special Instructions/Symptoms: Your throat may feel dry or sore from the anesthesia or the breathing tube placed in your throat during surgery. If this causes discomfort, gargle with warm salt water. The discomfort should disappear within 24 hours.  If you had a scopolamine patch placed behind your ear for the management of post- operative nausea and/or vomiting:  1. The medication in the patch is effective for 72 hours, after which it should be removed.  Wrap patch in a tissue and discard in the trash. Wash hands thoroughly with soap and water. 2. You may remove the patch earlier than 72 hours if you experience unpleasant side effects which may include dry mouth, dizziness or visual disturbances. 3. Avoid touching the patch. Wash your hands with soap and water after contact with the patch.

## 2015-12-10 NOTE — H&P (Signed)
Cc: Right neck mass, right tongue mass  HPI: The patient is a 54 year old male who returns today for his follow-up evaluation.  He was last seen 2 weeks ago. At that time, he was noted to have a large right level II neck mass.  He underwent fine needle aspiration biopsy of his right neck mass.  The FNA showed no obvious malignant cells.  However, his subsequent CT scan showed a hyperdense lesion within the right tongue base. The lesion measured 1.3 cm in diameter. This is in addition to his 3.6 cm right level II neck mass.  The findings were concerning for squamous cell carcinoma of the tongue base, with metastasis to the right neck.  The patient returns today reporting slight increase in the size of his right neck mass.  Currently he denies any significant dysphagia, odynophagia or dyspnea.  No other ENT, GI, or respiratory issue noted since the last visit.   Exam General: Communicates without difficulty, well nourished, no acute distress. Head: Normocephalic, no evidence injury, no tenderness, facial buttresses intact without stepoff. Eyes: PERRL, EOMI. No scleral icterus, conjunctivae clear. Neuro: CN II exam reveals vision grossly intact.  No nystagmus at any point of gaze. Ears: Auricles well formed without lesions.  Ear canals are intact without mass or lesion.  No erythema or edema is appreciated.  The TMs are intact without fluid. Nose: External evaluation reveals normal support and skin without lesions.  Dorsum is intact.  Anterior rhinoscopy reveals healthy pink mucosa over anterior aspect of inferior turbinates and intact septum.  No purulence noted. Oral:  Oral cavity and oropharynx are intact, symmetric, without erythema or edema.  Mucosa is moist. A 1cm right base of tongue mass is palpable. Neck: Full range of motion without pain.  There is a large level II neck mass.   Thyroid bed within normal limits to palpation.  Parotid glands and submandibular glands equal bilaterally without mass.   Trachea is midline.   Procedure:  Flexible Fiberoptic Laryngoscopy Risks, benefits, and alternatives of flexible endoscopy were explained to the patient.  Specific mention was made of the risk of throat numbness with difficulty swallowing, possible bleeding from the nose and mouth, and pain from the procedure.  The patient gave oral consent to proceed.  The nasal cavities were decongested and anesthetised with a combination of oxymetazoline and 4% lidocaine solution.  The flexible scope was inserted into the right nasal cavity and advanced towards the nasopharynx.  Visualized mucosa over the turbinates and septum were as described above.  The nasopharynx was clear.  Oropharyngeal walls were symmetric and mobile without lesion, mass, or edema.  Hypopharynx was also without  lesion or edema.  Larynx was mobile without lesions. Supraglottic structures were free of edema, mass, and asymmetry.  True vocal folds were white without mass or lesion.  A 1cm right base of tongue mass is noted. No obvious surface ulceration is noted.  Assessment 1.  A right base of tongue mass is noted on today's laryngoscopy exam.  2.  The patient continues to have a large right level II neck mass.  The neck mass measures 3.6 cm in diameter on his CT scan.   3.  The physical exam and CT findings are concerning for metastatic squamous cell carcinoma.   Plan  1.  In light of the above findings, the patient will need to undergo direct laryngoscopy and biopsy of his right tongue neck mass. We will also perform concurrent excisional biopsy of his right neck  mass.  2.  The risks, benefits, alternatives and details of the procedure are extensively discussed with the patient.   3.  The patient would like to proceed with the procedures.

## 2015-12-10 NOTE — Op Note (Signed)
DATE OF PROCEDURE:  12/10/2015                              OPERATIVE REPORT  SURGEON:  Leta Baptist, MD  PREOPERATIVE DIAGNOSES: 1. Right level 2 neck mass 2. Right base of tongue mass  POSTOPERATIVE DIAGNOSES: 1. Right level 2 neck mass 2. Right base of tongue mass  PROCEDURE PERFORMED:  1) Excision or deep right level II neck mass                                                     2) Direct laryngoscopy with biopsy  ANESTHESIA:  General endotracheal tube anesthesia.  COMPLICATIONS:  None.  ESTIMATED BLOOD LOSS:  Minimal.  INDICATION FOR PROCEDURE:  [name] Best is a 54 y.o. male with a history of an enlarging right level II neck mass. He underwent fine-needle aspiration biopsy of his right neck mass. The fine-needle aspiration biopsy results were inconclusive. However, his subsequent CT scan showed a hyperdense lesion within the right tongue base. The findings were concerning for squamous cell carcinoma of the tongue base, with metastasis to the right neck. Based on the above findings, the decision was made for patient to undergo the above-stated procedures. The risks, benefits, alternatives, and details of the procedure were discussed with the patient.  Questions were invited and answered.  Informed consent was obtained.  DESCRIPTION:  The patient was taken to the operating room and placed supine on the operating table.  General endotracheal tube anesthesia was administered by the anesthesiologist.  The patient was positioned and prepped and draped in a standard fashion for direct laryngoscopy. A Dedo laryngoscope was used for examination. The laryngoscope was inserted through the oral cavity into the pharynx. A 1.5 cm soft tissue mass was noted on the right tongue base. The vallecula, epiglottis, aryepiglottic folds, piriform sinuses, and true and false vocal cords are all normal. Attention was then focused on the tongue base mass. Multiple large biopsy specimens were obtained from the  mass, using an up-biting cup forceps. The specimens were sent to the pathology department for permanent histologic identification.   Attention was then focused on the right neck mass. 1% lidocaine with 1-100,000 epinephrine was infiltrated at the planned site of incision. A 5 cm horizontal incision was made over the right level II neck mass. The incision was carried down to the level of the platysma muscles. Superiorly and inferiorly based subplatysmal flaps were elevated in the standard fashion. The sternocleidomastoid muscle was retracted laterally, exposing a large 4 cm soft tissue mass. Careful dissection was then carried out to free the right neck mass from the surrounding soft tissue. The entire mass was removed without difficulty. The carotid artery and the internal jugular vein were identified and preserved. The surgical site was copiously irrigated. Good hemostasis was achieved. Surgicel was applied. The incision was closed in layers with 4-0 Vicryl and Dermabond.  The care of the patient was turned over to the anesthesiologist.  The patient was awakened from anesthesia without difficulty.  He was extubated and transferred to the recovery room in good condition.  OPERATIVE FINDINGS:  A 1.5 cm right tongue base mass and a large 4cm right level II neck mass.  SPECIMEN:  Tongue base biopsy and right neck mass.  FOLLOWUP CARE:  The patient will be discharged home once awake and alert.  He will be placed on amoxicillin 875 mg p.o. b.i.d. for 5 days.  Tylenol with oxycodone can be taken on a p.r.n. basis for pain control.  The patient will follow up in my office in approximately 1 week.  Ascencion Dike 12/10/2015 9:13 AM

## 2015-12-10 NOTE — Anesthesia Procedure Notes (Signed)
Procedure Name: Intubation Date/Time: 12/10/2015 7:40 AM Performed by: Paddy Walthall D Pre-anesthesia Checklist: Patient identified, Emergency Drugs available, Suction available and Patient being monitored Patient Re-evaluated:Patient Re-evaluated prior to inductionOxygen Delivery Method: Circle System Utilized Preoxygenation: Pre-oxygenation with 100% oxygen Intubation Type: IV induction Ventilation: Mask ventilation without difficulty Laryngoscope Size: Mac Grade View: Grade II Tube type: Oral Tube size: 7.0 mm Number of attempts: 1 Airway Equipment and Method: Stylet and Oral airway Placement Confirmation: ETT inserted through vocal cords under direct vision,  positive ETCO2 and breath sounds checked- equal and bilateral Secured at: 22 cm Tube secured with: Tape Dental Injury: Teeth and Oropharynx as per pre-operative assessment

## 2015-12-10 NOTE — Anesthesia Postprocedure Evaluation (Signed)
Anesthesia Post Note  Patient: [name] Best  Procedure(s) Performed: Procedure(s) (LRB): DIRECT LARYNGOSCOPY (N/A) RIGHT EXCISIONAL NECK MASS BIOPSY (Right) TONGUE BIOPSY (Right)  Patient location during evaluation: PACU Anesthesia Type: General Level of consciousness: awake and alert Pain management: pain level controlled Vital Signs Assessment: post-procedure vital signs reviewed and stable Respiratory status: spontaneous breathing Cardiovascular status: blood pressure returned to baseline Anesthetic complications: no    Last Vitals:  Filed Vitals:   12/10/15 1015 12/10/15 1056  BP: 155/88 155/79  Pulse: 60 64  Temp:  36.4 C  Resp: 17 18    Last Pain:  Filed Vitals:   12/10/15 1101  PainSc: 1                  Tiajuana Amass

## 2015-12-10 NOTE — Transfer of Care (Signed)
Immediate Anesthesia Transfer of Care Note  Patient: [name] Best  Procedure(s) Performed: Procedure(s): DIRECT LARYNGOSCOPY (N/A) RIGHT EXCISIONAL NECK MASS BIOPSY (Right) TONGUE BIOPSY (Right)  Patient Location: PACU  Anesthesia Type:General  Level of Consciousness: awake, alert , oriented and patient cooperative  Airway & Oxygen Therapy: Patient Spontanous Breathing and Patient connected to face mask oxygen  Post-op Assessment: Report given to RN and Post -op Vital signs reviewed and stable  Post vital signs: Reviewed and stable  Last Vitals:  Filed Vitals:   12/10/15 0626  BP: 149/92  Pulse: 68  Temp: 36.6 C  Resp: 20    Complications: No apparent anesthesia complications

## 2015-12-11 ENCOUNTER — Encounter (HOSPITAL_BASED_OUTPATIENT_CLINIC_OR_DEPARTMENT_OTHER): Payer: Self-pay | Admitting: Otolaryngology

## 2015-12-16 ENCOUNTER — Telehealth: Payer: Self-pay | Admitting: *Deleted

## 2015-12-16 ENCOUNTER — Encounter: Payer: Self-pay | Admitting: Hematology and Oncology

## 2015-12-16 ENCOUNTER — Telehealth: Payer: Self-pay | Admitting: Hematology and Oncology

## 2015-12-16 DIAGNOSIS — C01 Malignant neoplasm of base of tongue: Secondary | ICD-10-CM | POA: Insufficient documentation

## 2015-12-16 NOTE — Telephone Encounter (Signed)
  Oncology Nurse Navigator Documentation Referral date to RadOnc/MedOnc: 12/16/15 (12/16/15 1625) Navigator Encounter Type: Introductory phone call (12/16/15 1625)     Placed introductory call to new referral patient. 1. Introduced myself as the oncology nurse navigator that works with Dr. Alvy Bimler to whom he has been referred by Dr. Benjamine Mola. 2. He confirmed understanding of referral and tomorrow's appt at 1000. 3. I briefly explained my role as a navigator, noted that I could not join him tomorrow but would contact him next week when I return to work. 4. I provided my contact information, encouraged him to call with questions/concerns. 5. He verbalized understanding of information provided, expressed appreciation for my call.  [name], RN, BSN, [place] [facility] [place] [phone]                       Time Spent with Patient: 15 (12/16/15 1625)

## 2015-12-16 NOTE — Telephone Encounter (Signed)
new patient appt-s/w patient and gave np appt for 12/22 @ 9:45 w/Dr. Maurilio Lovely Referring Dr. Benjamine Mola

## 2015-12-17 ENCOUNTER — Ambulatory Visit (HOSPITAL_BASED_OUTPATIENT_CLINIC_OR_DEPARTMENT_OTHER): Payer: Commercial Managed Care - HMO | Admitting: Hematology and Oncology

## 2015-12-17 ENCOUNTER — Encounter: Payer: Self-pay | Admitting: Hematology and Oncology

## 2015-12-17 ENCOUNTER — Telehealth: Payer: Self-pay | Admitting: Hematology and Oncology

## 2015-12-17 VITALS — BP 151/92 | HR 72 | Temp 97.9°F | Resp 18 | Ht 75.0 in | Wt 270.6 lb

## 2015-12-17 DIAGNOSIS — B977 Papillomavirus as the cause of diseases classified elsewhere: Secondary | ICD-10-CM | POA: Diagnosis not present

## 2015-12-17 DIAGNOSIS — C01 Malignant neoplasm of base of tongue: Secondary | ICD-10-CM | POA: Diagnosis not present

## 2015-12-17 DIAGNOSIS — C029 Malignant neoplasm of tongue, unspecified: Secondary | ICD-10-CM

## 2015-12-17 NOTE — Progress Notes (Signed)
[place] CONSULT NOTE  Patient Care Team: Sharilyn Sites, MD as PCP - General (Family Medicine)  CHIEF COMPLAINTS/PURPOSE OF CONSULTATION:   squamous cell carcinoma of the base of tongue with regional lymph node metastasis  HISTORY OF PRESENTING ILLNESS:  [name] Best 54 y.o. male is here because of newly diagnosed squamous cell carcinoma of the base of the tongue. According to the patient, the first initial presentation was due to  Palpable lymphadenopathy on the right side of his neck.  the patient first noticed it in early November. He went to see his primary care doctor who ordered ultrasound and subsequent referral to ENT. I reviewed his records extensively and summarized as follows:   Cancer of base of tongue ([place])   12/02/2015 Imaging Ct neck showed right base of tongue cancer and large right neck cervical lymphadenopathy   12/10/2015 Pathology Results Accession: [id number] right tongue base and right neck mass biopsy were positive for invasive squamous cell cancer, HPV positive   12/10/2015 Procedure He underwent laryngoscopy and biopsy of base of tongue and excision of right neck mass    he denies any hearing deficit, difficulties with chewing food, swallowing difficulties, painful swallowing, changes in the quality of his voice or abnormal weight loss.  The patient has tobacco exposure for 37 years. 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 He experimented with smoking one time in 1979 but subsequently quit because he cannot tolerate it. He denies alcohol intake. There were no family history of cancer. He healed well after recent surgery. The patient have chronic degenerative joint disease and have chronic left hip pain and sciatica. He take hydrocodone twice a day for pain.  He had recent symptoms of chest discomfort and was evaluated by cardiologist. He cancelled his treadmill test because of his hip pain. He  denies further chest pain or shortness of breath since then.  MEDICAL HISTORY:  Past Medical History  Diagnosis Date  . Diverticulitis     hospitalized in 2003  . Chronic back pain   . Hyperlipidemia   . Anxiety   . PONV (postoperative nausea and vomiting)   . C. difficile diarrhea 2014    treated with Flagyl  . [facility])     SURGICAL HISTORY: Past Surgical History  Procedure Laterality Date  . Gallbladder surgery    . Left elbow    . Colonoscopy N/A 07/02/2013    [facility]:Sessile polyp (tubular adenoma) ranging between 3-26mm in size in the proximal sigmoid colon/mild diverticulosis   . Esophagogastroduodenoscopy N/A 07/02/2013    [facility]:4 cm hiatal hernia/esophagus appeared normal/chronic gastritis. Clotest negative.  . Colonoscopy      Dr. Laural Golden: prior to 2003 per patient  . Knee arthroscopy with medial menisectomy Left 04/14/2015    Procedure: KNEE ARTHROSCOPY WITH MEDIAL MENISECTOMY;  Surgeon: Sanjuana Kava, MD;  Location: [facility];  Service: Orthopedics;  Laterality: Left;  . Cholecystectomy    . Direct laryngoscopy N/A 12/10/2015    Procedure: DIRECT LARYNGOSCOPY;  Surgeon: Leta Baptist, MD;  Location: [facility];  Service: ENT;  Laterality: N/A;  . Mass biopsy Right 12/10/2015    Procedure: RIGHT EXCISIONAL NECK MASS BIOPSY;  Surgeon: Leta Baptist, MD;  Location: [facility];  Service: ENT;  Laterality: Right;  . Tongue biopsy Right 12/10/2015    Procedure: TONGUE BIOPSY;  Surgeon: Leta Baptist, MD;  Location: [facility];  Service: ENT;  Laterality: Right;  SOCIAL HISTORY: Social History   Social History  . Marital Status: Married    Spouse Name: N/A  . Number of Children: 2  . Years of Education: 12th grade   Occupational History  . tobacco packing operator [employer]   Social History Main Topics  . Smoking status: Never Smoker   . Smokeless tobacco: Never Used  . Alcohol Use: No  . Drug Use: Yes    Special: Marijuana     Comment:  last 2 wks ago  . Sexual Activity: Not on file     Comment: married, retired, 1 son & 1 daughter   Other Topics Concern  . Not on file   Social History Narrative   Lives with mother.      FAMILY HISTORY: Family History  Problem Relation Age of Onset  . Heart disease    . Colon cancer Neg Hx   . Colon polyps Neg Hx   . Heart failure Mother 71  . Heart attack Father 45  . Cancer Paternal Uncle     died of cancer    ALLERGIES:  is allergic to tetracyclines & related.  MEDICATIONS:  Current Outpatient Prescriptions  Medication Sig Dispense Refill  . amoxicillin (AMOXIL) 875 MG tablet Take 1 tablet (875 mg total) by mouth 2 (two) times daily. 10 tablet 0  . diazepam (VALIUM) 10 MG tablet Take 10 mg by mouth every 6 (six) hours as needed for anxiety.     . pravastatin (PRAVACHOL) 20 MG tablet Take 20 mg by mouth daily.     No current facility-administered medications for this visit.    REVIEW OF SYSTEMS:   Constitutional: Denies fevers, chills or abnormal night sweats Eyes: Denies blurriness of vision, double vision or watery eyes Ears, nose, mouth, throat, and face: Denies mucositis or sore throat Respiratory: Denies cough, dyspnea or wheezes Cardiovascular: Denies palpitation, chest discomfort or lower extremity swelling Gastrointestinal:  Denies nausea, heartburn or change in bowel habits Skin: Denies abnormal skin rashes Neurological:Denies numbness, tingling or new weaknesses Behavioral/Psych: Mood is stable, no new changes  All other systems were reviewed with the patient and are negative.  PHYSICAL EXAMINATION: ECOG PERFORMANCE STATUS: 1 - Symptomatic but completely ambulatory  Filed Vitals:   12/17/15 0850  BP: 151/92  Pulse: 72  Temp: 97.9 F (36.6 C)  Resp: 18   Filed Weights   12/17/15 0850  Weight: 270 lb 9.6 oz (122.743 kg)    GENERAL:alert, no distress and comfortable. He is mildly obese SKIN: skin color, texture, turgor are normal, no rashes or  significant lesions EYES: normal, conjunctiva are pink and non-injected, sclera clear OROPHARYNX:no exudate, no erythema and lips, buccal mucosa, and tongue normal  NECK:  Well-healed surgical scar on the right side of his neck with a little bit of seroma LYMPH:  no palpable lymphadenopathy in the cervical, axillary or inguinal LUNGS: clear to auscultation and percussion with normal breathing effort HEART: regular rate & rhythm and no murmurs and no lower extremity edema ABDOMEN:abdomen soft, non-tender and normal bowel sounds Musculoskeletal:no cyanosis of digits and no clubbing  PSYCH: alert & oriented x 3 with fluent speech NEURO: no focal motor/sensory deficits  LABORATORY DATA:  I have reviewed the data as listed Lab Results  Component Value Date   WBC 8.1 08/21/2015   HGB 15.6 12/10/2015   HCT 47.4 08/21/2015   MCV 89.6 08/21/2015   PLT 231 08/21/2015   Lab Results  Component Value Date   NA 138 08/21/2015  K 3.6 08/21/2015   CL 103 08/21/2015   CO2 27 08/21/2015    RADIOGRAPHIC STUDIES: I reviewed the imaging study with him and his daughter I have personally reviewed the radiological images as listed and agreed with the findings in the report. Ct Soft Tissue Neck W Contrast  12/02/2015  CLINICAL DATA:  Right-sided neck mass.  Negative biopsy last week. EXAM: CT NECK WITH CONTRAST TECHNIQUE: Multidetector CT imaging of the neck was performed using the standard protocol following the bolus administration of intravenous contrast. CONTRAST:  54mL OMNIPAQUE IOHEXOL 300 MG/ML  SOLN COMPARISON:  None. FINDINGS: Pharynx and larynx: A focal hyperdense lesion is present at the right tongue base measuring 11 x 11 x 13 mm. No other focal mucosal or submucosal lesions are evident. The vocal cords are midline and symmetric. Salivary glands: The salivary glands are within normal limits bilaterally. Thyroid: Negative. Lymph nodes: A a septated peripherally enhancing right level 2 mass lesion  measures 3.1 x 2.5 x 3.6 cm. An adjacent rounded node measures 7 mm at the level of the hyoid. No other significant adenopathy is present. No left-sided nodes are present. Vascular: Minimal vascular calcifications are present on the right without significant stenosis. Limited intracranial: Within normal limits Visualized orbits: Hot visualize Mastoids and visualized paranasal sinuses: A polyp or mucous retention cyst is present in the right maxillary sinus. Skeleton: Degenerative endplate changes are present on the right at C4-5 with uncovertebral spurring. No focal lytic or blastic lesions are present. Vertebral body heights and alignment are maintained. Upper chest: The lung apices are clear. IMPRESSION: 1. 13 mm hyperdense mucosal lesion is suggested at the right tongue base. This is concerning for a squamous cell carcinoma. 2. 3.1 x 2.5 x 3.6 cm heterogeneous and peripherally enhancing right level 2 mass lesion compatible with a metastatic node. 3. Smaller adjacent 7 mm rounded lymph node may also represent a metastatic node. Electronically Signed   By: San Morelle M.D.   On: 12/02/2015 13:52    ASSESSMENT:  Newly diagnosed squamous cell carcinoma of the Head & Neck, HPV Positive  PLAN:  Stage of the disease is to be determined, a PET/CT scan has been ordered.   Prognosis would depend on the results for the PET/CT scan, to be discussed and reviewed in the next visit.   Treatment options would include surgery, chemotherapy only, radiation only or chemotherapy in combination with radiation therapy.    In preparation for treatment, the patient will need the following tests or referrals, to be arranged #1 Referral to Radiation Oncology for consultation.  #2 Referral to dentist for full dental evaluation and possible dental extraction  #3 PET/CT scan.    I will get his case presented at the next ENT tumor board. I will contact the patient once we have further information. The next ENT  tumor board is on 12/30/2015  Orders Placed This Encounter  Procedures  . NM PET Image Initial (PI) Skull Base To Thigh    Standing Status: Future     Number of Occurrences:      Standing Expiration Date: 12/16/2016    Order Specific Question:  Reason for Exam (SYMPTOM  OR DIAGNOSIS REQUIRED)    Answer:  staging tongue ca    Order Specific Question:  Preferred imaging location?    Answer:  [facility]  . Ambulatory Referral to Dentistry (specifically to Dr. Enrique Sack)    Referral Priority:  Routine    Referral Type:  Consultation  Referral Reason:  Specialty Services Required    Requested Specialty:  Dental General Practice    Number of Visits Requested:  1  . Ambulatory Referral to Radiation Oncology    Referral Priority:  Routine    Referral Type:  Consultation    Referral Reason:  Specialty Services Required    Referred to Provider:  Eppie Gibson, MD    Requested Specialty:  Radiation Oncology    Number of Visits Requested:  1    All questions were answered. The patient knows to call the clinic with any problems, questions or concerns. I spent 40 minutes counseling the patient face to face. The total time spent in the appointment was 60 minutes and more than 50% was on counseling.     [place], [place], MD 12/17/2015 11:23 AM

## 2015-12-17 NOTE — Telephone Encounter (Signed)
Gave patient avs report and appointment to see Dr. [name] for tomorrow. Dental medicine closed for holidays and will reopen 12/28 - left message requesting appointment for patient with patient info - reason for referral and pending appointment with Dr. [name]. Dental medicine will contact patient re appointment - patient aware and will also f/u with them 12/29 if he doesn't hear from them 12/28. Central radiology scheduling will call patient re pet scan - patient aware. Per 12/22 pof no f/u with Dr. Alvy Bimler.

## 2015-12-17 NOTE — Assessment & Plan Note (Signed)
Stage of the disease is to be determined, a PET/CT scan has been ordered.   Prognosis would depend on the results for the PET/CT scan, to be discussed and reviewed in the next visit.   Treatment options would include surgery, chemotherapy only, radiation only or chemotherapy in combination with radiation therapy.      In preparation for treatment, the patient will need the following tests or referrals, to be arranged #1 Referral to Radiation Oncology for consultation.  #2 Referral to dentist for full dental evaluation and possible dental extraction  #3 PET/CT scan.

## 2015-12-18 ENCOUNTER — Ambulatory Visit
Admission: RE | Admit: 2015-12-18 | Payer: Commercial Managed Care - HMO | Source: Ambulatory Visit | Admitting: Radiation Oncology

## 2015-12-18 ENCOUNTER — Ambulatory Visit: Payer: Commercial Managed Care - HMO

## 2015-12-18 NOTE — Progress Notes (Signed)
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  Patient presented in early November with symptoms of:  A Palpable Lymphadenopathy on the right side of his neck.  Biopsies revealed: 12/10/15  Diagnosis 1. Tongue, biopsy, Right tongue base - INVASIVE SQUAMOUS CELL CARCINOMA. - SEE DESCRIPTION. 2. Soft tissue, biopsy, Right neck mass - INVASIVE SQUAMOUS CELL CARCINOMA.  Nutrition Status Yes No Comments  Weight changes? [x]  []  He reports he has gained weight because inactivity related lower back, left leg pain  Swallowing concerns? []  [x]    PEG? []  [x]     Referrals Yes No Comments  Social Work? []  [x]    Dentistry? [x]  []  He sees a dentist every 3 months related to a disease that causes his gums to recede. He has not seen a dentist related to his new cancer diagnosis.  Swallowing therapy? []  [x]    Nutrition? []  [x]    Med/Onc? [x]  []  Dr. Alvy Bimler 12/17/15   Safety Issues Yes No Comments  Prior radiation? []  [x]    Pacemaker/ICD? []  [x]    Possible current pregnancy? []  [x]    Is the patient on methotrexate? []  [x]     Tobacco/Marijuana/Snuff/ETOH use: Never a smoker, Occasional Marijuana use. 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  Past/Anticipated interventions by otolaryngology, if any:   . Mass biopsy Right 12/10/2015    Procedure: RIGHT EXCISIONAL NECK MASS BIOPSY; Surgeon: Leta Baptist, MD; Location: [facility]; Service: ENT; Laterality: Right;  . Tongue biopsy Right 12/10/2015    Procedure: TONGUE BIOPSY; Surgeon: Leta Baptist, MD; Location: [facility]; Service: ENT; Laterality: Right;          Past/Anticipated interventions by medical oncology, if any:  12/17/15 PET planned 12/31/14. Treatment options would include surgery, chemotherapy only, radiation only, or chemotherapy in combination with radiation therapy.  His case is to be presented at the next ENT tumor board 12/29/14.   BP 148/101 mmHg  Pulse 72  Temp(Src) 97.8 F (36.6 C)  Ht 6\' 3"  (1.905 m)  Wt 274 lb 6.4 oz (124.467 kg)  BMI 34.30 kg/m2   Wt Readings from Last 3 Encounters:  12/23/15 274 lb 6.4 oz (124.467 kg)  12/17/15 270 lb 9.6 oz (122.743 kg)  12/10/15 270 lb (122.471 kg)       Current Complaints / other details:  None

## 2015-12-23 ENCOUNTER — Ambulatory Visit
Admission: RE | Admit: 2015-12-23 | Discharge: 2015-12-23 | Disposition: A | Discharge status: Home / Self Care | Payer: Commercial Managed Care - HMO | Source: Ambulatory Visit | Attending: Radiation Oncology | Admitting: Radiation Oncology

## 2015-12-23 ENCOUNTER — Encounter: Payer: Self-pay | Admitting: *Deleted

## 2015-12-23 ENCOUNTER — Encounter: Payer: Self-pay | Admitting: Radiation Oncology

## 2015-12-23 VITALS — BP 148/101 | HR 72 | Temp 97.8°F | Ht 75.0 in | Wt 274.4 lb

## 2015-12-23 DIAGNOSIS — E785 Hyperlipidemia, unspecified: Secondary | ICD-10-CM | POA: Insufficient documentation

## 2015-12-23 DIAGNOSIS — Z809 Family history of malignant neoplasm, unspecified: Secondary | ICD-10-CM | POA: Insufficient documentation

## 2015-12-23 DIAGNOSIS — C01 Malignant neoplasm of base of tongue: Secondary | ICD-10-CM | POA: Insufficient documentation

## 2015-12-23 DIAGNOSIS — Z8249 Family history of ischemic heart disease and other diseases of the circulatory system: Secondary | ICD-10-CM | POA: Insufficient documentation

## 2015-12-23 DIAGNOSIS — Z510 Encounter for antineoplastic radiation therapy: Secondary | ICD-10-CM | POA: Diagnosis not present

## 2015-12-23 DIAGNOSIS — M545 Low back pain: Secondary | ICD-10-CM | POA: Insufficient documentation

## 2015-12-23 DIAGNOSIS — G8929 Other chronic pain: Secondary | ICD-10-CM | POA: Diagnosis not present

## 2015-12-23 DIAGNOSIS — K5792 Diverticulitis of intestine, part unspecified, without perforation or abscess without bleeding: Secondary | ICD-10-CM | POA: Diagnosis not present

## 2015-12-23 DIAGNOSIS — F419 Anxiety disorder, unspecified: Secondary | ICD-10-CM | POA: Diagnosis not present

## 2015-12-23 NOTE — Progress Notes (Signed)
Radiation Oncology         [phone] ________________________________  Initial outpatient Consultation  Name: [name] Best MRN: [id number]  Date: 12/23/2015  DOB: [birth year]  [id number], Betsy Coder, MD  Heath Lark, MD   REFERRING PHYSICIAN: Heath Lark, MD  DIAGNOSIS:    ICD-9-CM ICD-10-CM   1. Cancer of base of tongue (HCC) 141.0 C01    [id number] Base of tongue squamous cell carcinoma  HISTORY OF PRESENT ILLNESS::[name] Best is a 54 y.o. male who presented with palpable neck adenopathy.  Subsequently, the patient saw Dr. Benjamine Mola who ordered CT of neck with contrast. This was done on 12-02-15, revealing a 106mm right tongue base lesion and a 3.6cm right level 2 neck mass with a suspicious adjacent 102mm node.  On direct laryngoscopy, Dr Benjamine Mola appreciated a 1.5 cm soft tissue mass was noted on the right tongue base. The vallecula, epiglottis, aryepiglottic folds, piriform sinuses, and true and false vocal cords are all normal. Biopsy of tongue base and neck on 12-10-15 revealed: 1. Tongue, biopsy, Right tongue base - INVASIVE SQUAMOUS CELL CARCINOMA.  Immunohistochemistry is performed and the tumor shows strong, diffuse positivity with p16 (HPV).  2. Soft tissue, biopsy, Right neck mass - INVASIVE SQUAMOUS CELL CARCINOMA.    Patient then saw Dr Alvy Bimler, who ordered a PET that is pending and also referred pt to Dr Enrique Sack of dentistry.  Pt will be discussed at tumor [facility] on 12-30-15   Swallowing issues, if any: none  Weight Changes: some gain  Pain status: none  Other symptoms: 2/10 chronic sciatic left leg pain, seeing Dr Vertell Limber in January  Tobacco history, if any: none; occasional marijuana, recently quit  ETOH abuse, if any: none  Prior cancers, if any: none  PREVIOUS RADIATION THERAPY: No  PAST MEDICAL HISTORY:  has a past medical history of Diverticulitis; Chronic back pain; Hyperlipidemia; Anxiety; PONV (postoperative nausea and vomiting); C. difficile diarrhea  (2014); and Cancer ([place]).    PAST SURGICAL HISTORY: Past Surgical History  Procedure Laterality Date  . Gallbladder surgery    . Left elbow    . Colonoscopy N/A 07/02/2013    [facility]:Sessile polyp (tubular adenoma) ranging between 3-51mm in size in the proximal sigmoid colon/mild diverticulosis   . Esophagogastroduodenoscopy N/A 07/02/2013    [facility]:4 cm hiatal hernia/esophagus appeared normal/chronic gastritis. Clotest negative.  . Colonoscopy      Dr. Laural Golden: prior to 2003 per patient  . Knee arthroscopy with medial menisectomy Left 04/14/2015    Procedure: KNEE ARTHROSCOPY WITH MEDIAL MENISECTOMY;  Surgeon: Sanjuana Kava, MD;  Location: [facility];  Service: Orthopedics;  Laterality: Left;  . Cholecystectomy    . Direct laryngoscopy N/A 12/10/2015    Procedure: DIRECT LARYNGOSCOPY;  Surgeon: Leta Baptist, MD;  Location: [facility];  Service: ENT;  Laterality: N/A;  . Mass biopsy Right 12/10/2015    Procedure: RIGHT EXCISIONAL NECK MASS BIOPSY;  Surgeon: Leta Baptist, MD;  Location: [facility];  Service: ENT;  Laterality: Right;  . Tongue biopsy Right 12/10/2015    Procedure: TONGUE BIOPSY;  Surgeon: Leta Baptist, MD;  Location: [facility];  Service: ENT;  Laterality: Right;    FAMILY HISTORY: family history includes Cancer in his paternal uncle; Heart attack (age of onset: 37) in his father; Heart failure (age of onset: 50) in his mother. There is no history of Colon cancer or Colon polyps.  SOCIAL HISTORY:  reports that he has never smoked. He has never used  smokeless tobacco. He reports that he uses illicit drugs (Marijuana). He reports that he does not drink alcohol.  ALLERGIES: Tetracyclines & related  MEDICATIONS:  Current Outpatient Prescriptions  Medication Sig Dispense Refill  . diazepam (VALIUM) 10 MG tablet Take 10 mg by mouth every 6 (six) hours as needed for anxiety.     Marland Kitchen HYDROcodone-acetaminophen (NORCO/VICODIN) 5-325 MG tablet Take 1-2 tablets  by mouth every 6 (six) hours as needed for moderate pain.    . pravastatin (PRAVACHOL) 20 MG tablet Take 20 mg by mouth daily.     No current facility-administered medications for this encounter.    REVIEW OF SYSTEMS:  Notable for that above.   PHYSICAL EXAM:  height is 6\' 3"  (1.905 m) and weight is 274 lb 6.4 oz (124.467 kg). His temperature is 97.8 F (36.6 C). His blood pressure is 148/101 and his pulse is 72.   General: Alert and oriented, in no acute distress HEENT: Head is normocephalic. Extraocular movements are intact. Oropharynx is notable for no palpable or visualized lesions. Neck: Neck is notable for 3 cm right level 2 neck mass Heart: Regular in rate and rhythm with no murmurs, rubs, or gallops. Chest: Clear to auscultation bilaterally, with no rhonchi, wheezes, or rales. Abdomen: Soft, nontender, nondistended, with no rigidity or guarding. Extremities: No cyanosis or edema. Lymphatics: see Neck Exam Skin: No concerning lesions. Musculoskeletal: symmetric strength and muscle tone throughout. Neurologic: Cranial nerves II through XII are grossly intact. No obvious focalities. Speech is fluent. Coordination is intact. Psychiatric: Judgment and insight are intact. Affect is appropriate.   ECOG = 1  0 - Asymptomatic (Fully active, able to carry on all predisease activities without restriction)  1 - Symptomatic but completely ambulatory (Restricted in physically strenuous activity but ambulatory and able to carry out work of a light or sedentary nature. For example, light housework, office work)  2 - Symptomatic, <50% in bed during the day (Ambulatory and capable of all self care but unable to carry out any work activities. Up and about more than 50% of waking hours)  3 - Symptomatic, >50% in bed, but not bedbound (Capable of only limited self-care, confined to bed or chair 50% or more of waking hours)  4 - Bedbound (Completely disabled. Cannot carry on any self-care.  Totally confined to bed or chair)  5 - Death   Eustace Pen MM, Creech RH, Tormey DC, et al. [phone]). "Toxicity and response criteria of the [facility]". [place] Oncol. 5 (6): 649-55   LABORATORY DATA:  Lab Results  Component Value Date   WBC 8.1 08/21/2015   HGB 15.6 12/10/2015   HCT 47.4 08/21/2015   MCV 89.6 08/21/2015   PLT 231 08/21/2015   CMP     Component Value Date/Time   NA 138 08/21/2015 0826   K 3.6 08/21/2015 0826   CL 103 08/21/2015 0826   CO2 27 08/21/2015 0826   GLUCOSE 167* 08/21/2015 0826   BUN 14 08/21/2015 0826   CREATININE 1.23 08/21/2015 0826   CALCIUM 8.6* 08/21/2015 0826   PROT 7.2 08/21/2015 0826   ALBUMIN 3.9 08/21/2015 0826   AST 19 08/21/2015 0826   ALT 19 08/21/2015 0826   ALKPHOS 82 08/21/2015 0826   BILITOT 0.7 08/21/2015 0826   GFRNONAA >60 08/21/2015 0826   GFRAA >60 08/21/2015 0826      Lab Results  Component Value Date   BILITOT 0.7 08/21/2015   No results found for: TSH  RADIOGRAPHY: Ct Soft Tissue Neck W Contrast  12/02/2015  CLINICAL DATA:  Right-sided neck mass.  Negative biopsy last week. EXAM: CT NECK WITH CONTRAST TECHNIQUE: Multidetector CT imaging of the neck was performed using the standard protocol following the bolus administration of intravenous contrast. CONTRAST:  18mL OMNIPAQUE IOHEXOL 300 MG/ML  SOLN COMPARISON:  None. FINDINGS: Pharynx and larynx: A focal hyperdense lesion is present at the right tongue base measuring 11 x 11 x 13 mm. No other focal mucosal or submucosal lesions are evident. The vocal cords are midline and symmetric. Salivary glands: The salivary glands are within normal limits bilaterally. Thyroid: Negative. Lymph nodes: A a septated peripherally enhancing right level 2 mass lesion measures 3.1 x 2.5 x 3.6 cm. An adjacent rounded node measures 7 mm at the level of the hyoid. No other significant adenopathy is present. No left-sided nodes are present. Vascular: Minimal vascular  calcifications are present on the right without significant stenosis. Limited intracranial: Within normal limits Visualized orbits: Hot visualize Mastoids and visualized paranasal sinuses: A polyp or mucous retention cyst is present in the right maxillary sinus. Skeleton: Degenerative endplate changes are present on the right at C4-5 with uncovertebral spurring. No focal lytic or blastic lesions are present. Vertebral body heights and alignment are maintained. Upper chest: The lung apices are clear. IMPRESSION: 1. 13 mm hyperdense mucosal lesion is suggested at the right tongue base. This is concerning for a squamous cell carcinoma. 2. 3.1 x 2.5 x 3.6 cm heterogeneous and peripherally enhancing right level 2 mass lesion compatible with a metastatic node. 3. Smaller adjacent 7 mm rounded lymph node may also represent a metastatic node. Electronically Signed   By: San Morelle M.D.   On: 12/02/2015 13:52      IMPRESSION/PLAN:  This is a delightful patient with head and neck cancer. Assuming PET rules out distant mets, I recommend radiotherapy for this patient.  We discussed the potential risks, benefits, and side effects of radiotherapy. We talked in detail about acute and late effects. We discussed that some of the most bothersome acute effects may be mucositis, dysgeusia, salivary changes, skin irritation, hair loss, dehydration, weight loss and fatigue. We talked about late effects which include but are not necessarily limited to dysphagia, hypothyroidism, nerve injury, spinal cord injury, xerostomia, trismus, and neck edema. No guarantees of treatment were given. A consent form was signed and placed in the patient's medical record. The patient is enthusiastic about proceeding with treatment. I look forward to participating in the patient's care.    Simulation (treatment planning) will take place after clearance from dentistry and after PET  We also discussed that the treatment of head and neck  cancer is a multidisciplinary process to maximize treatment outcomes and quality of life. For this reasons the following referrals have been or will be made:    Medical oncology to discuss chemotherapy (completed consult)    Dentistry for dental evaluation, possible extractions in the radiation fields, and /or advice on reducing risk of cavities, osteoradionecrosis, or other oral issues.    Nutritionist for nutrition support during and after treatment.    Speech language pathology for swallowing and/or speech therapy.    Social work for social support.     Baseline labs including TSH.   Feeding tube discussed, referral on hold until disposition settled with med/onc. __________________________________________   Eppie Gibson, MD

## 2015-12-23 NOTE — Progress Notes (Addendum)
  Oncology Nurse Navigator Documentation   Navigator Encounter Type: Initial RadOnc (12/23/15 0830)     Barriers/Navigation Needs: Education (12/23/15 0830)       Met with patient during initial consult with Dr. [name].   1. Further introduced myself as his Navigator, explained my role as a member of the Care Team.   2. Provided New Patient Information packet, discussed contents:  Contact information for physician(s), myself, other members of the Care Team.  Advance Directive information ([place] blue pamphlet with LCSW contact info)  Fall Prevention Patient Safety Plan  Appointment Guideline  Financial Assistance Information sheet  ACS Referral form  [facility] campus map with highlight of [place] 3. Provided introductory explanation of radiation treatment including SIM planning and purpose of Aquaplast head and shoulder mask, showed example.   4. Provided photos/diagrams of feeding tube and PAC, explained their purpose. 5. Discussed mentorship program, offered to arrange mentor for him.  Will follow up with him on his decision. 6. Provided a tour of SIM and [place] areas, explained treatment and arrival procedures. 7. Showed him location of Dr. Ritta Slot office and [facility] as reference for future appts, including arrival procedure for these appts.   8. I encouraged him to contact me with questions/concerns as treatments/procedures begin.  He verbalized understanding of information provided.    [name], RN, BSN, [facility] [facility] [phone]              Time Spent with Patient: 75 (12/23/15 0830)

## 2015-12-24 ENCOUNTER — Telehealth: Payer: Self-pay | Admitting: *Deleted

## 2015-12-24 NOTE — Telephone Encounter (Signed)
CALLED PATIENT TO ASK ABOUT COMING IN FOR A LAB ON 01-01-16, PATIENT AGREED TO DO SO AFTER HIS PET ON 01-01-16, PATIENT SCHEDULED FOR 8:30 AM FOR LAB ON 01-01-16

## 2015-12-31 ENCOUNTER — Encounter (HOSPITAL_COMMUNITY): Payer: Self-pay | Admitting: Dentistry

## 2015-12-31 ENCOUNTER — Ambulatory Visit (HOSPITAL_COMMUNITY): Payer: Self-pay | Admitting: Dentistry

## 2015-12-31 VITALS — BP 152/89 | HR 73 | Temp 98.0°F

## 2015-12-31 DIAGNOSIS — Z01818 Encounter for other preprocedural examination: Secondary | ICD-10-CM

## 2015-12-31 DIAGNOSIS — C01 Malignant neoplasm of base of tongue: Secondary | ICD-10-CM

## 2015-12-31 DIAGNOSIS — K0530 Chronic periodontitis, unspecified: Secondary | ICD-10-CM

## 2015-12-31 DIAGNOSIS — K036 Deposits [accretions] on teeth: Secondary | ICD-10-CM

## 2015-12-31 DIAGNOSIS — K08409 Partial loss of teeth, unspecified cause, unspecified class: Secondary | ICD-10-CM

## 2015-12-31 DIAGNOSIS — IMO0002 Reserved for concepts with insufficient information to code with codable children: Secondary | ICD-10-CM

## 2015-12-31 MED ORDER — SODIUM FLUORIDE 1.1 % DT GEL: DENTAL | Status: DC

## 2015-12-31 NOTE — Progress Notes (Signed)
DENTAL CONSULTATION  Date of Consultation:  12/31/2015 Patient Name:   [name] Best Date of Birth:   26-Mar-1961 Medical Record Number: [id number]  VITALS: BP 152/89 mmHg  Pulse 73  Temp(Src) 98 F (36.7 C) (Oral)  CHIEF COMPLAINT: Patient referred by Dr. [name] for a dental consultation.  HPI: [name] Best is a 55 year old male recently diagnosed with squamous cell carcinoma of the right base of tongue. Patient with anticipated chemoradiation therapy. Patient now seen as part of a medically necessary pre-chemoradiation therapy dental protocol examination.   Patient currently denies acute toothaches, swellings, or abscesses. Patient was last seen for a dental cleaning on November 04, 2015. This was with the office of Dr. Clemens Catholic in [place], [place]. Patient is usually seen on every 3 month basis. Patient alternates seeing Dr. Seward Grater with seeing Dr. Lyla Son (Periodontist). Patient has a history of previous periodontal surgical therapy with Dr. Lyla Son.   PROBLEM LIST: Patient Active Problem List   Diagnosis Date Noted  . Cancer of base of tongue ([place]) 12/16/2015    Priority: High  . Diarrhea 09/19/2013  . GERD (gastroesophageal reflux disease) 09/19/2013  . Diverticulosis 08/09/2013  . LLQ pain 08/09/2013  . Chondromalacia of left patella 08/16/2011    PMH: Past Medical History  Diagnosis Date  . Diverticulitis     hospitalized in 2003  . Chronic back pain   . Hyperlipidemia   . Anxiety   . PONV (postoperative nausea and vomiting)   . C. difficile diarrhea 2014    treated with Flagyl  . Cancer [facility])     PSH: Past Surgical History  Procedure Laterality Date  . Gallbladder surgery    . Left elbow    . Colonoscopy N/A 07/02/2013    Jenkins:Sessile polyp (tubular adenoma) ranging between 3-39mm in size in the proximal sigmoid colon/mild diverticulosis   . Esophagogastroduodenoscopy N/A 07/02/2013    Jenkins:4 cm hiatal hernia/esophagus appeared  normal/chronic gastritis. Clotest negative.  . Colonoscopy      Dr. Laural Golden: prior to 2003 per patient  . Knee arthroscopy with medial menisectomy Left 04/14/2015    Procedure: KNEE ARTHROSCOPY WITH MEDIAL MENISECTOMY;  Surgeon: Sanjuana Kava, MD;  Location: [facility];  Service: Orthopedics;  Laterality: Left;  . Cholecystectomy    . Direct laryngoscopy N/A 12/10/2015    Procedure: DIRECT LARYNGOSCOPY;  Surgeon: Leta Baptist, MD;  Location: [place];  Service: ENT;  Laterality: N/A;  . Mass biopsy Right 12/10/2015    Procedure: RIGHT EXCISIONAL NECK MASS BIOPSY;  Surgeon: Leta Baptist, MD;  Location: [place];  Service: ENT;  Laterality: Right;  . Tongue biopsy Right 12/10/2015    Procedure: TONGUE BIOPSY;  Surgeon: Leta Baptist, MD;  Location: [place];  Service: ENT;  Laterality: Right;    ALLERGIES: Allergies  Allergen Reactions  . Tetracyclines & Related Hives    MEDICATIONS: Current Outpatient Prescriptions  Medication Sig Dispense Refill  . diazepam (VALIUM) 10 MG tablet Take 10 mg by mouth every 6 (six) hours as needed for anxiety.     . pravastatin (PRAVACHOL) 20 MG tablet Take 20 mg by mouth daily.    Marland Kitchen HYDROcodone-acetaminophen (NORCO/VICODIN) 5-325 MG tablet Take 1-2 tablets by mouth every 6 (six) hours as needed for moderate pain. Reported on 12/31/2015     No current facility-administered medications for this visit.    LABS: Lab Results  Component Value Date   WBC 8.1 08/21/2015   HGB 15.6 12/10/2015  HCT 47.4 08/21/2015   MCV 89.6 08/21/2015   PLT 231 08/21/2015      Component Value Date/Time   NA 138 08/21/2015 0826   K 3.6 08/21/2015 0826   CL 103 08/21/2015 0826   CO2 27 08/21/2015 0826   GLUCOSE 167* 08/21/2015 0826   BUN 14 08/21/2015 0826   CREATININE 1.23 08/21/2015 0826   CALCIUM 8.6* 08/21/2015 0826   GFRNONAA >60 08/21/2015 0826   GFRAA >60 08/21/2015 0826   No results found for: INR, PROTIME No results found  for: PTT  SOCIAL HISTORY: Social History   Social History  . Marital Status: Married    Spouse Name: N/A  . Number of Children: 2  . Years of Education: 12th grade   Occupational History  . tobacco packing operator [employer]   Social History Main Topics  . Smoking status: Never Smoker   . Smokeless tobacco: Never Used  . Alcohol Use: No  . Drug Use: Yes    Special: Marijuana     Comment: last 2 wks ago  . Sexual Activity: Not on file     Comment: married, retired, 1 son & 1 daughter   Other Topics Concern  . Not on file   Social History Narrative   Lives with mother.      FAMILY HISTORY: Family History  Problem Relation Age of Onset  . Heart disease    . Colon cancer Neg Hx   . Colon polyps Neg Hx   . Heart failure Mother 61  . Heart attack Father 47  . Cancer Paternal Uncle     died of cancer    REVIEW OF SYSTEMS:  Reviewed review of systems with the patient from Dr. Alvy Bimler note of 12/15/2015. Positive for respiratory changes. Constitutional: Denies fevers, chills or abnormal night sweats Eyes: Denies blurriness of vision, double vision or watery eyes Ears, nose, mouth, throat, and face: Denies mucositis or sore throat Respiratory: Current cough and "cold" symptoms. Denies dyspnea or wheezes Cardiovascular: Denies palpitation, chest discomfort or lower extremity swelling Gastrointestinal: Denies nausea, heartburn or change in bowel habits Skin: Denies abnormal skin rashes Neurological:Denies numbness, tingling or new weaknesses Behavioral/Psych: Mood is stable, no new changes, H/O anxiety. Musculosketal: Chronic back pain with no other changes. All other systems were reviewed with the patient and are negative.  DENTAL HISTORY: CHIEF COMPLAINT: Patient referred by Dr. [name] for a dental consultation.  HPI: [name] Best is a 55 year old male recently diagnosed with squamous cell carcinoma of the right base of tongue. Patient with anticipated  chemoradiation therapy. Patient now seen as part of a medically necessary pre-chemoradiation therapy dental protocol examination.   Patient currently denies acute toothaches, swellings, or abscesses. Patient was last seen for a dental cleaning on November 04, 2015. This was with the office of Dr. Clemens Catholic in [place], [place]. Patient is usually seen on every 3 month basis. Patient alternates seeing Dr. Seward Grater with seeing Dr. Lyla Son (Periodontist). Patient has a history of previous periodontal surgical therapy with Dr. Lyla Son.   DENTAL EXAMINATION: GENERAL: The patient is a well-developed, well-nourished male in no acute distress. HEAD AND NECK: The right neck is consistent with previous lymph node removal. There is no left neck lymphadenopathy. The patient denies acute TMJ symptoms. Maximum interincisal opening is measured at 46 mm. INTRAORAL EXAM: Patient has normal saliva. Patient has bilateral mandibular lingual tori. There is no evidence of oral abscess formation. DENTITION: Patient is missing tooth numbers 1, 2, 11,  13, 16, 17, and 32. The patient has peg laterals in the area of tooth numbers 7 and 10. Mandibular incisal attrition is noted. PERIODONTAL: Patient has chronic periodontitis with plaque and calculus accumulations, generalized gingival recession, and tooth mobility as per dental charting form. DENTAL CARIES/SUBOPTIMAL RESTORATIONS: Patient appears to have dental caries associated with tooth numbers 4 on the distal, 14 on the distal, and 29 on the distal. ENDODONTIC: The patient currently denies acute pulpitis symptoms. I do not see any evidence of periapical pathology or radiolucency. CROWN AND BRIDGE: Patient has a porcelain crown on tooth #3. PROSTHODONTIC: There are no partial dentures. OCCLUSION: Patient has a poor occlusal scheme secondary to multiple missing teeth, supra-eruption and drifting of the unopposed teeth into the edentulous areas, right  posterior crossbite, and lack of replacement of all missing teeth with dental prostheses.  RADIOGRAPHIC INTERPRETATION: Orthopantogram was taken and supplemented with a full series of dental radiographs. Patient is missing tooth numbers 1, 2, 11, 13, 16, 17, and 32.  There is incipient to moderate bone loss. Dental caries are noted. There is supra-eruption and drifting of the unopposed teeth into the edentulous areas. Mandibular incisal attrition is noted. There are no obvious periodical radiolucencies. There is a radiopaque area at the level of the bone around tooth #19 of unknown etiology.  ASSESSMENTS: 1. Squamous cell carcinoma of the right base of tongue 2. Pre-chemoradiation therapy dental protocol 3. Chronic periodontitis with bone loss 4. Gingival recession 5. Accretions 6. Incipient tooth mobility as charted 7. Missing teeth 8. Maxillary Peg lateral incisors 9. Dental caries 10. Mandibular right posterior crossbite area 4/5 with 28/29 11. Multiple rotated teeth 12. Poor occlusal scheme and malocclusion 13. Bilateral mandibular lingual tori 14. A radiopaque area noted on dental radiographs in the area of tooth #19-unknown etiology.  PLAN/RECOMMENDATIONS: 1. I discussed the risks, benefits, and complications of various treatment options with the patient in relationship to his medical and dental conditions, anticipated chemoradiation therapy and chemoradiation therapy side effects to include xerostomia, radiation caries, trismus, mucositis, taste changes, gum and jawbone changes, and risk for infection and osteoradionecrosis. We discussed various treatment options to include no treatment, possible extraction of tooth number 31 with alveoloplasty, pre-prosthetic surgery as indicated, periodontal therapy, dental restorations, root canal therapy, crown and bridge therapy, implant therapy, and replacement of missing teeth as indicated. The patient currently wishes to discuss need for  extraction of lower right mandibular tooth #31 with his primary dentist-Dr. Seward Grater and Dr. Essie Hart (Periodontist). Patient currently wishes to keep all remaining teeth if possible if his primary dentist and periodontist can maintain his current dentition. The patient did agree to follow-up with Dr. Seward Grater for evaluation for need for restoration on tooth numbers 4, 14, and 29. Patient will contact Dr. Essie Hart to arrange for periodontal therapy as soon as possible. Dr. Essie Hart will also provide his opinion concerning prognosis of tooth numbers 3 at that time. Patient did agree to proceed with impressions today for the fabrication of fluoride trays. I do not feel that scatter protection devices will be needed due to lack of amalgam restorations.  A prescription for FluoriSHIELD was sent to [facility] outpatient pharmacy-today.  2. Discussion of findings with medical team and coordination of future medical and dental care as needed.  I spent in excess of 90 minutes during the conduct of this consultation and >50% of this time involved direct face-to-face encounter for counseling and/or coordination of the patient's care.    Lenn Cal, DDS

## 2015-12-31 NOTE — Patient Instructions (Addendum)

## 2016-01-01 ENCOUNTER — Ambulatory Visit: Admission: RE | Admit: 2016-01-01 | Payer: Commercial Managed Care - HMO | Source: Ambulatory Visit

## 2016-01-01 ENCOUNTER — Encounter (HOSPITAL_COMMUNITY): Payer: Commercial Managed Care - HMO

## 2016-01-01 ENCOUNTER — Telehealth: Payer: Self-pay | Admitting: *Deleted

## 2016-01-01 NOTE — Telephone Encounter (Signed)
  Oncology Nurse Navigator Documentation  Navigator Location: [facility] (01/01/16 1556) Navigator Encounter Type: Telephone (01/01/16 1556) Telephone: Incoming Call (01/01/16 1556)             Barriers/Navigation Needs: Coordination of Care (01/01/16 1556)       Patient returned my call, explained he cancelled this morning's PET because "had a bad cold".  I encouraged him to call Radiology Scheduling to reschedule at earliest possible date.  He indicated he would do so after our conversation.  [name], RN, BSN, [place] at [place] [phone]                       Time Spent with Patient: 15 (01/01/16 1556)

## 2016-01-06 ENCOUNTER — Telehealth: Payer: Self-pay | Admitting: *Deleted

## 2016-01-06 NOTE — Telephone Encounter (Signed)
PET scan moved up to 1/16 per Imagene Riches in Radiology.  Pt to arrive at 7 am on 1/16 and NPO after Midnight.  [name] will give pt appt information/ instructions.

## 2016-01-06 NOTE — Telephone Encounter (Signed)
  Oncology Nurse Navigator Documentation Navigator Location: [facility] (01/06/16 1157) Navigator Encounter Type: Telephone (01/06/16 1157) Telephone: Incoming Call;Appt Confirmation/Clarification (01/06/16 1157)       [name] Best called to confirm understanding of 7:15 arrival for 7:30 PET on Monday 01/11/16 and NPO after midnight.  [name], RN, BSN, [place] at [place] [phone]                                     Time Spent with Patient: 15 (01/06/16 1157)

## 2016-01-06 NOTE — Telephone Encounter (Addendum)
  Oncology Nurse Navigator Documentation Navigator Location: [facility] (01/06/16 1103) Navigator Encounter Type: Telephone (01/06/16 1103) Telephone: [place] Call (01/06/16 1103)             Barriers/Navigation Needs: Coordination of Care (01/06/16 1103)       Called [name] Best to inform him of new PET appt for 01/11/16 7:30, 7:15 arrival, NPO after midnight.  Provided information to his mother, asked her to have him call me to confirm.  She verbalized understanding.  [name], RN, BSN, [place] at [place] [phone]                        Time Spent with Patient: 15 (01/06/16 1103)

## 2016-01-07 ENCOUNTER — Ambulatory Visit: Payer: Commercial Managed Care - HMO | Attending: Radiation Oncology

## 2016-01-07 ENCOUNTER — Telehealth: Payer: Self-pay | Admitting: *Deleted

## 2016-01-07 ENCOUNTER — Encounter: Payer: Self-pay | Admitting: Hematology and Oncology

## 2016-01-07 DIAGNOSIS — R1310 Dysphagia, unspecified: Secondary | ICD-10-CM | POA: Insufficient documentation

## 2016-01-07 NOTE — Telephone Encounter (Signed)
  Oncology Nurse Navigator Documentation Navigator Location: [facility] (01/07/16 1505) Navigator Encounter Type: Telephone (01/07/16 1505) Telephone: [name] Call;Appt Confirmation/Clarification (01/07/16 1505)     Called patient to inform him of rescheduled TSH lab appt for 01/01/16 1330, spoke with his wife.  She verbalized understanding, indicated she would let him know.  I asked that he call me with any questions.  [name], RN, BSN, [place] at [place] [phone]                                       Time Spent with Patient: 15 (01/07/16 1505)

## 2016-01-07 NOTE — Patient Instructions (Signed)
SWALLOWING EXERCISES Do these once a day until you start radiation, then as directed 6 of the 7 days  per week until 6 months after your last day of radiation, then 2-3 times per week afterwards  1. Effortful Swallows - Squeeze hard with the muscles in your neck while you swallow your  saliva or a sip of water - Repeat 15-20 times, 2-3 times a day, and use whenever you eat or drink  2. Masako Swallow - swallow with your tongue sticking out - Stick tongue out past your teeth and gently bite tongue with your teeth - Swallow, while holding your tongue with your teeth - Repeat 15-20 times, 2-3 times a day *use a wet spoon if your mouth gets dry*  3. Shaker Exercise - head lift - Lie flat on your back in your bed or on a couch without pillows - Raise your head and look at your feet - KEEP YOUR SHOULDERS DOWN - HOLD FOR 45-60 SECONDS, then lower your head back down - Repeat 3 times, 2-3 times a day  4. Mendelsohn Maneuver - "half swallow" exercise - Start to swallow, and keep your Adam's apple up by squeezing hard with the muscles of the throat - Hold the squeeze for 5-7 seconds and then relax - Repeat 15 times, 2-3 times a day *use a wet spoon if your mouth gets dry*  5. Breath Hold - Say "HUH!" loudly, then hold your breath for 3 seconds at your voice box - Repeat 20 times, 2-3 times a day  6. Chin pushback - Open your mouth  - Place your fist UNDER your chin near your neck, and push back with your fist for 5 seconds - Repeat 8-10 times, 2-3 times a day    =========================================================================== EAT and DRINK as FAR into your treatment as possible. If things get painful when you swallow you may want to take some pain meds - THEN do the exercises and eat and/or drink something.

## 2016-01-07 NOTE — Progress Notes (Signed)
I sent office notes for gorsuch and squire to [employer]  [phone]

## 2016-01-08 NOTE — Therapy (Signed)
[facility] [address] [place], [place], [address] Phone: [phone]   Fax:  [phone]  Speech Language Pathology Evaluation  Patient Details  Name: [name] Best MRN: [id number] Date of Birth: Sep 27, 1961 No Data Recorded  Encounter Date: 01/07/2016      End of Session - 01/07/16 1241    Visit Number 1   Number of Visits 7   Date for SLP Re-Evaluation 07/06/16   SLP Start Time 0931   SLP Stop Time  1005  pt understood HEP and did not require extra practice   SLP Time Calculation (min) 34 min   Activity Tolerance Patient tolerated treatment well      Past Medical History  Diagnosis Date  . Diverticulitis     hospitalized in 2003  . Chronic back pain   . Hyperlipidemia   . Anxiety   . PONV (postoperative nausea and vomiting)   . C. difficile diarrhea 2014    treated with Flagyl  . Cancer [facility])     Past Surgical History  Procedure Laterality Date  . Gallbladder surgery    . Left elbow    . Colonoscopy N/A 07/02/2013    Jenkins:Sessile polyp (tubular adenoma) ranging between 3-27mm in size in the proximal sigmoid colon/mild diverticulosis   . Esophagogastroduodenoscopy N/A 07/02/2013    Jenkins:4 cm hiatal hernia/esophagus appeared normal/chronic gastritis. Clotest negative.  . Colonoscopy      Dr. Laural Golden: prior to 2003 per patient  . Knee arthroscopy with medial menisectomy Left 04/14/2015    Procedure: KNEE ARTHROSCOPY WITH MEDIAL MENISECTOMY;  Surgeon: Sanjuana Kava, MD;  Location: [facility];  Service: Orthopedics;  Laterality: Left;  . Cholecystectomy    . Direct laryngoscopy N/A 12/10/2015    Procedure: DIRECT LARYNGOSCOPY;  Surgeon: Leta Baptist, MD;  Location: [place];  Service: ENT;  Laterality: N/A;  . Mass biopsy Right 12/10/2015    Procedure: RIGHT EXCISIONAL NECK MASS BIOPSY;  Surgeon: Leta Baptist, MD;  Location: [place];  Service: ENT;  Laterality: Right;  . Tongue  biopsy Right 12/10/2015    Procedure: TONGUE BIOPSY;  Surgeon: Leta Baptist, MD;  Location: [place];  Service: ENT;  Laterality: Right;    There were no vitals filed for this visit.  Visit Diagnosis: Dysphagia      Subjective Assessment - 01/07/16 0940    Subjective No overt signs/symptoms aspiration reported.            SLP Evaluation [facility] - 01/07/16 0942    SLP Visit Information   SLP Received On 01/07/16   Pain Assessment   Pain Score 3    Pain Location Leg   Pain Orientation Left   Pain Frequency Constant   Pain Relieving Factors --  meds   Prior Functional Status   Cognitive/Linguistic Baseline Within functional limits   Cognition   Overall Cognitive Status Within Functional Limits for tasks assessed   Auditory Comprehension   Overall Auditory Comprehension Appears within functional limits for tasks assessed   Verbal Expression   Overall Verbal Expression Appears within functional limits for tasks assessed   Oral Motor/Sensory Function   Overall Oral Motor/Sensory Function Appears within functional limits for tasks assessed   Labial ROM Within Functional Limits   Labial Symmetry Within Functional Limits   Labial Strength Within Functional Limits   Lingual ROM Reduced right  SLIGHT limitation believed due to dental extractions 01/06/16   Lingual Symmetry Within Functional Limits  Lingual Strength Within Functional Limits   Lingual Coordination WFL   Facial Strength Within Functional Limits   Velum Within Functional Limits   Motor Speech   Overall Motor Speech Appears within functional limits for tasks assessed   Articulation Within functional limitis       Catheter for chemo, and PEG tube placed next week. Pt currently tolerates regular diet with thin liquid. Oral motor assessment as above.  POs: Pt ate cereal bar and drank water without overt s/s aspiration. Thyroid elevation appeared WNL, and swallows appeared timely. Oral residue noted as  minimal/WNL. Pt's swallow deemed WNL at this time.   Because data states the risk for dysphagia during and after radiation treatment is high due to undergoing radiation tx, SLP taught pt about the possibility of reduced/limited ability for PO intake during rad tx. SLP encouraged pt to continue swallowing POs as far into rad tx as possible, even ingesting POs and/or completing HEP shortly after administration of pain meds.   SLP educated pt re: changes to swallowing musculature after rad tx, and why adherence to dysphagia HEP provided today and PO consumption was necessary to inhibit muscular disuse atrophy and and other complications following rad tx. Pt demonstrated understanding of these things to SLP.     After eval tasks, SLP then developed a HEP for pt and pt was instructed how to perform exercises involving lingual, vocal, and pharyngeal strengthening. SLP performed each exercise and pt return demonstrated each exercise. SLP ensured pt performance was correct prior to moving on to next exercise. Pt was instructed to complete this program once a day until radiation therapy began, then as directed 6-7 days per week until 60 days after his last rad tx, then x2-3 a week after that.                   SLP Education - 01/07/16 0836    Education provided Yes   Education Details HEP, late effects head/neck rad on swallowing   Person(s) Educated Patient   Methods Explanation;Demonstration;Verbal cues;Handout   Comprehension Verbalized understanding;Returned demonstration          SLP Short Term Goals - 01/07/16 1244    SLP SHORT TERM GOAL #1   Title pt will perform HEP with rare min A   Time 3   Period --  visits   Status New   SLP SHORT TERM GOAL #2   Title pt will tell SLP why he is completing HEP   Time 2   Period --  visits   Status New   SLP SHORT TERM GOAL #3   Title pt will tell SLP 3 s/s aspiration PNA   Time 3   Period --  visits   Status New           SLP Long Term Goals - 01/07/16 1244    SLP LONG TERM GOAL #1   Title pt will tell SLP why a food journal can be beneficial for return to full, least restrictive PO diet   Time 4   Period --  visits   Status New   SLP LONG TERM GOAL #2   Title pt will complete HEP independently over 3 visits   Time 6   Period --  visits   Status New          Plan - 01/07/16 1242    Clinical Impression Statement Pt presents with WNL swallowing at this time however over the course of radiation and chemotherapy tx  and afterwards, pt will need to return for skilled ST for assessment of procedure of HEP as well as assess safety with PO intake.   Speech Therapy Frequency --  approx every four weeks   Duration --  6 visits   Treatment/Interventions Aspiration precaution training;Pharyngeal strengthening exercises;Oral motor exercises;Diet toleration management by SLP;Trials of upgraded texture/liquids;Compensatory techniques;Cueing hierarchy   Potential to Achieve Goals Good   SLP Home Exercise Plan provided today 01-07-16   Consulted and Agree with Plan of Care Patient        Problem List Patient Active Problem List   Diagnosis Date Noted  . Cancer of base of tongue ([place]) 12/16/2015  . Diarrhea 09/19/2013  . GERD (gastroesophageal reflux disease) 09/19/2013  . Diverticulosis 08/09/2013  . LLQ pain 08/09/2013  . Chondromalacia of left patella 08/16/2011    [facility] , MS, CCC-SLP  01/08/2016, 8:38 AM  [facility] [address] [place], [place], [address] Phone: [phone]   Fax:  [phone]  Name: [name] Best MRN: [id number] Date of Birth: 12-06-1961

## 2016-01-11 ENCOUNTER — Ambulatory Visit: Payer: Commercial Managed Care - HMO

## 2016-01-11 ENCOUNTER — Encounter (HOSPITAL_COMMUNITY)
Admission: RE | Admit: 2016-01-11 | Discharge: 2016-01-11 | Disposition: A | Discharge status: Home / Self Care | Payer: Commercial Managed Care - HMO | Source: Ambulatory Visit | Attending: Hematology and Oncology | Admitting: Hematology and Oncology

## 2016-01-11 ENCOUNTER — Telehealth: Payer: Self-pay | Admitting: Hematology and Oncology

## 2016-01-11 ENCOUNTER — Telehealth: Payer: Self-pay | Admitting: *Deleted

## 2016-01-11 ENCOUNTER — Encounter: Payer: Commercial Managed Care - HMO | Admitting: Nutrition

## 2016-01-11 ENCOUNTER — Ambulatory Visit
Admission: RE | Admit: 2016-01-11 | Discharge: 2016-01-11 | Disposition: A | Discharge status: Home / Self Care | Payer: Commercial Managed Care - HMO | Source: Ambulatory Visit | Attending: Radiation Oncology | Admitting: Radiation Oncology

## 2016-01-11 ENCOUNTER — Ambulatory Visit: Payer: Commercial Managed Care - HMO | Admitting: Radiation Oncology

## 2016-01-11 ENCOUNTER — Other Ambulatory Visit: Payer: Self-pay | Admitting: Hematology and Oncology

## 2016-01-11 ENCOUNTER — Ambulatory Visit: Payer: Self-pay | Admitting: Hematology and Oncology

## 2016-01-11 DIAGNOSIS — C029 Malignant neoplasm of tongue, unspecified: Secondary | ICD-10-CM | POA: Insufficient documentation

## 2016-01-11 DIAGNOSIS — C01 Malignant neoplasm of base of tongue: Secondary | ICD-10-CM

## 2016-01-11 LAB — GLUCOSE, CAPILLARY: Glucose-Capillary: 109 mg/dL — ABNORMAL HIGH (ref 65–99)

## 2016-01-11 MED ORDER — FLUDEOXYGLUCOSE F - 18 (FDG) INJECTION
13.5900 | Freq: Once | INTRAVENOUS | Status: AC | PRN
  Administered 2016-01-11: 13.59 via INTRAVENOUS

## 2016-01-11 NOTE — Telephone Encounter (Signed)
cld & talked with pt spouse in re to appt sch for today w/Dr Gorsuch-wife stated pt could not make it due to another appt sch-trans to [facility]

## 2016-01-11 NOTE — Telephone Encounter (Signed)
per pof appts already moved-moved nut appt and called pt to adv-talked w/mother Pamala Hurry and adv of time * date of nut appt

## 2016-01-11 NOTE — Telephone Encounter (Signed)
per pof to sch pt appt-cld pt voicemail not set up cld other # and it's consisently busy, schper pof Dr Alvy Bimler

## 2016-01-11 NOTE — Telephone Encounter (Signed)
  Oncology Nurse Navigator Documentation Navigator Location: [facility] (01/11/16 1135) Navigator Encounter Type: Telephone (01/11/16 1135) Telephone: [name] Call;Appt Confirmation/Clarification (01/11/16 1135)       Spoke with [name] Best to inform him of adjusted appt schedule for this Wed:  9:00 CT SIM, 10:30 Dr. Alvy Bimler.  He understands the TSH lab scheduled for today will be rescheduled for Wed in conjunction with these appts.  [name] Orem, RN, BSN, [place] at [place] [phone]                                     Time Spent with Patient: 15 (01/11/16 1135)

## 2016-01-13 ENCOUNTER — Ambulatory Visit: Payer: Commercial Managed Care - HMO | Attending: Radiation Oncology

## 2016-01-13 ENCOUNTER — Telehealth: Payer: Self-pay | Admitting: Hematology and Oncology

## 2016-01-13 ENCOUNTER — Ambulatory Visit
Admission: RE | Admit: 2016-01-13 | Discharge: 2016-01-13 | Disposition: A | Discharge status: Home / Self Care | Payer: Commercial Managed Care - HMO | Source: Ambulatory Visit | Attending: Radiation Oncology | Admitting: Radiation Oncology

## 2016-01-13 ENCOUNTER — Encounter (HOSPITAL_COMMUNITY): Payer: Commercial Managed Care - HMO

## 2016-01-13 ENCOUNTER — Telehealth: Payer: Self-pay | Admitting: *Deleted

## 2016-01-13 ENCOUNTER — Ambulatory Visit: Payer: Commercial Managed Care - HMO | Admitting: Radiation Oncology

## 2016-01-13 ENCOUNTER — Encounter: Payer: Self-pay | Admitting: Hematology and Oncology

## 2016-01-13 ENCOUNTER — Ambulatory Visit (HOSPITAL_BASED_OUTPATIENT_CLINIC_OR_DEPARTMENT_OTHER): Payer: Commercial Managed Care - HMO | Admitting: Hematology and Oncology

## 2016-01-13 VITALS — BP 155/92 | HR 70 | Temp 97.6°F | Resp 19 | Wt 275.4 lb

## 2016-01-13 DIAGNOSIS — Z510 Encounter for antineoplastic radiation therapy: Secondary | ICD-10-CM | POA: Diagnosis not present

## 2016-01-13 DIAGNOSIS — C01 Malignant neoplasm of base of tongue: Secondary | ICD-10-CM | POA: Diagnosis not present

## 2016-01-13 MED ORDER — DEXAMETHASONE 4 MG PO TABS: ORAL_TABLET | ORAL | Status: DC

## 2016-01-13 MED ORDER — SODIUM FLUORIDE 1.1 % DT GEL: DENTAL | Status: DC

## 2016-01-13 MED ORDER — PROCHLORPERAZINE MALEATE 10 MG PO TABS: 10.0000 mg | ORAL_TABLET | Freq: Four times a day (QID) | ORAL | Status: DC | PRN

## 2016-01-13 MED ORDER — ONDANSETRON HCL 8 MG PO TABS
8.0000 mg | ORAL_TABLET | Freq: Three times a day (TID) | ORAL | Status: DC | PRN
Start: 2016-01-13 — End: 2016-02-04

## 2016-01-13 MED ORDER — LIDOCAINE-PRILOCAINE 2.5-2.5 % EX CREA: TOPICAL_CREAM | CUTANEOUS | Status: DC

## 2016-01-13 NOTE — Telephone Encounter (Signed)
  Oncology Nurse Navigator Documentation Navigator Location: [facility] (01/13/16 1523) Navigator Encounter Type: Telephone (01/13/16 1523) Telephone: [place] Call (01/13/16 1523)                 Interventions: Coordination of Care (01/13/16 1523)   Coordination of Care: Appts (01/13/16 1523)       Per Dr. Calton Dach guidance, called ENT Dr Deeann Saint office, requested patient be contacted to schedule a baseline audiogram.  Receptionist verbalized understanding.[name], RN, BSN, [place] at [place] [phone]              Time Spent with Patient: 15 (01/13/16 1523)

## 2016-01-13 NOTE — Progress Notes (Signed)
[place] OFFICE PROGRESS NOTE  Patient Care Team: Sharilyn Sites, MD as PCP - General (Family Medicine) Leta Baptist, MD as Consulting Physician (Otolaryngology) Heath Lark, MD as Consulting Physician (Hematology and Oncology) Leota Sauers, RN as Oncology Nurse Navigator Eppie Gibson, MD as Attending Physician (Radiation Oncology)  SUMMARY OF ONCOLOGIC HISTORY:   Cancer of base of tongue ([place])   12/02/2015 Imaging Ct neck showed right base of tongue cancer and large right neck cervical lymphadenopathy   12/10/2015 Pathology Results Accession: [id number] right tongue base and right neck mass biopsy were positive for invasive squamous cell cancer, HPV positive   12/10/2015 Procedure He underwent laryngoscopy and biopsy of base of tongue and excision of right neck mass   01/11/2016 Imaging PET scan: Hypermetabolic right tongue lesion without evidence of metastatic disease. A necrotic appearing right level-II lymph node, seen on 12/02/2015, is no longer visualized. 2. 1.8 cm low-attenuation lesion in the left lobe of the thyroid, nonspecific    INTERVAL HISTORY: Please see below for problem oriented charting.  he returns for further follow-up and review of treatment recommendation. His case was discussed at the ENT tumor board today.  He complained of minor pain after recent dental extraction.  REVIEW OF SYSTEMS:   Constitutional: Denies fevers, chills or abnormal weight loss Eyes: Denies blurriness of vision Ears, nose, mouth, throat, and face: Denies mucositis or sore throat Respiratory: Denies cough, dyspnea or wheezes Cardiovascular: Denies palpitation, chest discomfort or lower extremity swelling Gastrointestinal:  Denies nausea, heartburn or change in bowel habits Skin: Denies abnormal skin rashes Lymphatics: Denies new lymphadenopathy or easy bruising Neurological:Denies numbness, tingling or new weaknesses Behavioral/Psych: Mood is stable, no new changes  All  other systems were reviewed with the patient and are negative.  I have reviewed the past medical history, past surgical history, social history and family history with the patient and they are unchanged from previous note.  ALLERGIES:  is allergic to tetracyclines & related.  MEDICATIONS:  Current Outpatient Prescriptions  Medication Sig Dispense Refill  . diazepam (VALIUM) 10 MG tablet Take 10 mg by mouth every 6 (six) hours as needed for anxiety.     Marland Kitchen HYDROcodone-acetaminophen (NORCO/VICODIN) 5-325 MG tablet Take 1-2 tablets by mouth every 6 (six) hours as needed for moderate pain. Reported on 12/31/2015    . pravastatin (PRAVACHOL) 20 MG tablet Take 20 mg by mouth daily.    . sodium fluoride (FLUORISHIELD) 1.1 % GEL dental gel Instill one drop of gel per tooth space of tray. Place over teeth for 5 minutes. Remove. Spit out excess. Repeat nightly. 120 mL prn  . dexamethasone (DECADRON) 4 MG tablet Take 2 tablets by mouth once a day on the day after chemotherapy daily for 2 days. Take with food. 30 tablet 1  . lidocaine-prilocaine (EMLA) cream Apply to affected area once 30 g 3  . ondansetron (ZOFRAN) 8 MG tablet Take 1 tablet (8 mg total) by mouth every 8 (eight) hours as needed. Start on the third day after chemotherapy. 30 tablet 1  . prochlorperazine (COMPAZINE) 10 MG tablet Take 1 tablet (10 mg total) by mouth every 6 (six) hours as needed (Nausea or vomiting). 30 tablet 1   No current facility-administered medications for this visit.    PHYSICAL EXAMINATION: ECOG PERFORMANCE STATUS: 0 - Asymptomatic  Filed Vitals:   01/13/16 0954  BP: 155/92  Pulse: 70  Temp: 97.6 F (36.4 C)  Resp: 19   Filed Weights   01/13/16 0954  Weight: 275 lb 6.4 oz (124.921 kg)    GENERAL:alert, no distress and comfortable SKIN: skin color, texture, turgor are normal, no rashes or significant lesions EYES: normal, Conjunctiva are pink and non-injected, sclera clear OROPHARYNX:no exudate, no  erythema and lips, buccal mucosa, and tongue normal  NECK: supple, thyroid normal size, non-tender, without nodularity LYMPH:  no palpable lymphadenopathy in the cervical, axillary or inguinal LUNGS: clear to auscultation and percussion with normal breathing effort HEART: regular rate & rhythm and no murmurs and no lower extremity edema ABDOMEN:abdomen soft, non-tender and normal bowel sounds Musculoskeletal:no cyanosis of digits and no clubbing  NEURO: alert & oriented x 3 with fluent speech, no focal motor/sensory deficits  LABORATORY DATA:  I have reviewed the data as listed    Component Value Date/Time   NA 138 08/21/2015 0826   K 3.6 08/21/2015 0826   CL 103 08/21/2015 0826   CO2 27 08/21/2015 0826   GLUCOSE 167* 08/21/2015 0826   BUN 14 08/21/2015 0826   CREATININE 1.23 08/21/2015 0826   CALCIUM 8.6* 08/21/2015 0826   PROT 7.2 08/21/2015 0826   ALBUMIN 3.9 08/21/2015 0826   AST 19 08/21/2015 0826   ALT 19 08/21/2015 0826   ALKPHOS 82 08/21/2015 0826   BILITOT 0.7 08/21/2015 0826   GFRNONAA >60 08/21/2015 0826   GFRAA >60 08/21/2015 0826    No results found for: SPEP, UPEP  Lab Results  Component Value Date   WBC 8.1 08/21/2015   NEUTROABS 6.1 08/21/2015   HGB 15.6 12/10/2015   HCT 47.4 08/21/2015   MCV 89.6 08/21/2015   PLT 231 08/21/2015      Chemistry      Component Value Date/Time   NA 138 08/21/2015 0826   K 3.6 08/21/2015 0826   CL 103 08/21/2015 0826   CO2 27 08/21/2015 0826   BUN 14 08/21/2015 0826   CREATININE 1.23 08/21/2015 0826      Component Value Date/Time   CALCIUM 8.6* 08/21/2015 0826   ALKPHOS 82 08/21/2015 0826   AST 19 08/21/2015 0826   ALT 19 08/21/2015 0826   BILITOT 0.7 08/21/2015 0826       RADIOGRAPHIC STUDIES: I reviewed the PET scan with the patient I have personally reviewed the radiological images as listed and agreed with the findings in the report.    ASSESSMENT & PLAN:  Cancer of base of tongue ([place]) We discussed  the role of chemotherapy. The intent is for cure.  We discussed some of the risks, benefits, side-effects of cisplatin. Some of the short term side-effects included, though not limited to, including weight loss, life threatening infections, risk of allergic reactions, need for transfusions of blood products, nausea, vomiting, change in bowel habits, loss of hair, admission to hospital for various reasons, and risks of death.   Long term side-effects are also discussed including risks of infertility, permanent damage to nerve function, hearing loss, chronic fatigue, kidney damage with possibility needing hemodialysis, and rare secondary malignancy including bone marrow disorders.  The patient is aware that the response rates discussed earlier is not guaranteed.  After a long discussion, patient made an informed decision to proceed with the prescribed plan of care  I will arrange for placement of port and feeding tube. We will arrange for chemotherapy education class. We plan to start his treatment on 01/25/2016 and I will see him a week afterwards for supportive care and assessment of toxicity.   Orders Placed This Encounter  Procedures  . IR  GASTROSTOMY TUBE MOD SED    Standing Status: Future     Number of Occurrences:      Standing Expiration Date: 03/12/2017    Order Specific Question:  Reason for Exam (SYMPTOM  OR DIAGNOSIS REQUIRED)    Answer:  need feeding tube, OK pull through tecnique    Order Specific Question:  Preferred Imaging Location?    Answer:  [facility]    Order Specific Question:  If indicated for the ordered procedure, I authorize the administration of contrast media per Radiology protocol    Answer:  Yes  . IR Fluoro Guide CV Line Right    Standing Status: Future     Number of Occurrences:      Standing Expiration Date: 03/12/2017    Order Specific Question:  Reason for Exam (SYMPTOM  OR DIAGNOSIS REQUIRED)    Answer:  PAC & G-TUBE @ same time    Order  Specific Question:  Preferred Imaging Location?    Answer:  [facility]    Order Specific Question:  If indicated for the ordered procedure, I authorize the administration of contrast media per Radiology protocol    Answer:  Yes  . Comprehensive metabolic panel    Standing Status: Standing     Number of Occurrences: 22     Standing Expiration Date: 01/12/2017  . Magnesium - [facility]    Standing Status: Standing     Number of Occurrences: 9     Standing Expiration Date: 01/12/2017  . CBC with Differential    Standing Status: Standing     Number of Occurrences: 20     Standing Expiration Date: 01/13/2017  . PHYSICIAN COMMUNICATION ORDER    A baseline Audiogram is recommended prior to initiation of cisplatin chemotherapy.   All questions were answered. The patient knows to call the clinic with any problems, questions or concerns. No barriers to learning was detected. I spent 30 minutes counseling the patient face to face. The total time spent in the appointment was 40 minutes and more than 50% was on counseling and review of test results     [facility], [place], MD 01/13/2016 3:29 PM

## 2016-01-13 NOTE — Telephone Encounter (Signed)
per pof to sch pt apppt-gave pt copy of avs-sent MW/Melissa email to sch trmt

## 2016-01-13 NOTE — Progress Notes (Signed)
Head and Neck Cancer Simulation, IMRT treatment planning, and Special treatment procedure note   Outpatient  Diagnosis:    ICD-9-CM ICD-10-CM   1. Cancer of base of tongue (HCC) 141.0 C01     The patient was taken to the CT simulator and laid in the supine position on the table. An Aquaplast head and shoulder mask was custom fitted to the patient's anatomy. High-resolution CT axial imaging was obtained of the head and neck with contrast. I verified that the quality of the imaging is good for treatment planning. 1 Medically Necessary Treatment Device was fabricated and supervised by me: Aquaplast mask.   Treatment planning note I plan to treat the patient with IMRT. I plan to treat the patient's base of tongue tumor and bilateral neck nodes. I plan to treat to a total dose of 70 Gray in 35  fractions. Dose calculation was ordered from dosimetry.  IMRT planning Note  IMRT is an important modality to deliver adequate dose to the patient's at risk tissues while sparing the patient's normal structures, including the: esophagus, parotid tissue, mandible, brain stem, spinal cord, oral cavity, brachial plexus.  This justifies the use of IMRT in the patient's treatment.   Special Treatment Procedure Note:  The patient will be receiving chemotherapy concurrently. Chemotherapy heightens the risk of side effects. I have considered this during the patient's treatment planning process and will monitor the patient accordingly for side effects on a weekly basis. Concurrent chemotherapy increases the complexity of this patient's treatment and therefore this constitutes a special treatment procedure.  -----------------------------------  Eppie Gibson, MD

## 2016-01-13 NOTE — Assessment & Plan Note (Signed)
We discussed the role of chemotherapy. The intent is for cure.  We discussed some of the risks, benefits, side-effects of cisplatin. Some of the short term side-effects included, though not limited to, including weight loss, life threatening infections, risk of allergic reactions, need for transfusions of blood products, nausea, vomiting, change in bowel habits, loss of hair, admission to hospital for various reasons, and risks of death.   Long term side-effects are also discussed including risks of infertility, permanent damage to nerve function, hearing loss, chronic fatigue, kidney damage with possibility needing hemodialysis, and rare secondary malignancy including bone marrow disorders.  The patient is aware that the response rates discussed earlier is not guaranteed.  After a long discussion, patient made an informed decision to proceed with the prescribed plan of care  I will arrange for placement of port and feeding tube. We will arrange for chemotherapy education class. We plan to start his treatment on 01/25/2016 and I will see him a week afterwards for supportive care and assessment of toxicity.

## 2016-01-15 ENCOUNTER — Other Ambulatory Visit: Payer: Self-pay | Admitting: Radiology

## 2016-01-18 ENCOUNTER — Other Ambulatory Visit: Payer: Commercial Managed Care - HMO

## 2016-01-18 ENCOUNTER — Telehealth: Payer: Self-pay | Admitting: *Deleted

## 2016-01-18 ENCOUNTER — Ambulatory Visit: Payer: Commercial Managed Care - HMO | Admitting: Nutrition

## 2016-01-18 DIAGNOSIS — Z510 Encounter for antineoplastic radiation therapy: Secondary | ICD-10-CM | POA: Diagnosis not present

## 2016-01-18 NOTE — Progress Notes (Signed)
55 year old male diagnosed with tongue cancer.  He is a patient of Dr. Alvy Bimler and Dr. [name].  Past medical history includes diverticulitis, hyperlipidemia, anxiety, C. difficile colitis and tobacco exposure.  Medications include Valium.  Labs include glucose 167.  Height: 6 feet 3 inches. Weight: 275.4 pounds. Usual body weight: 260-270 pounds. BMI: 34.42.  Patient has just completed chemotherapy education class. He reports he will receive a feeding tube on Friday. Currently he states he has no difficulty eating. He follows a low lactose diet.  Foods with increased lactose cause stomach pain.  Nutrition diagnosis:  Predicted suboptimal energy intake related to new diagnosis of tongue cancer and associated treatments as evidenced by history or presence of this condition for which research shows an increased incidence of suboptimal energy intake.  Intervention:  Patient educated to consume small frequent meals and snacks utilizing high-calorie, high-protein foods to promote maintenance of lean body mass. Provided fact sheet on increasing calories and protein. Brief education provided on management of feeding tube. Educated patient on strategies for dealing with nausea and vomiting. Questions were answered.  Teach back method used.  Oral nutrition supplement samples provided. Contact information was given.  Monitoring, evaluation, goals: Patient will tolerate increased calories and protein to minimize loss of lean body mass.  Next visit: Monday, January 30, during infusion.  **Disclaimer: This note was dictated with voice recognition software. Similar sounding words can inadvertently be transcribed and this note may contain transcription errors which may not have been corrected upon publication of note.**

## 2016-01-18 NOTE — Telephone Encounter (Signed)
Per staff message and POF I have scheduled appts. Advised scheduler of appts. JMW  

## 2016-01-19 ENCOUNTER — Other Ambulatory Visit: Payer: Self-pay

## 2016-01-19 ENCOUNTER — Other Ambulatory Visit: Payer: Self-pay | Admitting: Hematology and Oncology

## 2016-01-20 ENCOUNTER — Other Ambulatory Visit: Payer: Self-pay

## 2016-01-21 ENCOUNTER — Telehealth: Payer: Self-pay | Admitting: *Deleted

## 2016-01-21 ENCOUNTER — Other Ambulatory Visit: Payer: Self-pay | Admitting: Radiology

## 2016-01-21 NOTE — Telephone Encounter (Signed)
  Oncology Nurse Navigator Documentation  Navigator Location: [facility] (01/21/16 1110) Navigator Encounter Type: Telephone (01/21/16 1110) Telephone: [name] Call;Appt Confirmation/Clarification (01/21/16 1110)                     Coordination of Care: Appts (01/21/16 1110)        Spoke with [name] Best to check on understanding of tomorrow's procedures, status of recently prescribed Rx.  He indicated he had barium contrast, understands he is to drink this evening, maintain NPO status after midnight, arrive ca 7:00-7:15 at [facility] for [facility] and PEG placement.   He indicated his mother will accompany him tomorrow.  I explained I will provide PEG education and supplies tomorrow morning prior to procedure.  He confirmed possession of Emla cream, verbalized understanding of application prior to next Monday's infusion.  He confirmed possession of Decadron and understanding of directions for use next week after chemotherapy. He knows I can be contacted with needs/concerns.  [name], RN, BSN, [place] at [place] [phone]              Time Spent with Patient: 15 (01/21/16 1110)

## 2016-01-22 ENCOUNTER — Ambulatory Visit (HOSPITAL_COMMUNITY)
Admission: RE | Admit: 2016-01-22 | Discharge: 2016-01-22 | Disposition: A | Discharge status: Home / Self Care | Payer: Commercial Managed Care - HMO | Source: Ambulatory Visit | Attending: Hematology and Oncology | Admitting: Hematology and Oncology

## 2016-01-22 ENCOUNTER — Other Ambulatory Visit: Payer: Self-pay | Admitting: Hematology and Oncology

## 2016-01-22 ENCOUNTER — Encounter (HOSPITAL_COMMUNITY): Payer: Self-pay

## 2016-01-22 ENCOUNTER — Encounter: Payer: Self-pay | Admitting: *Deleted

## 2016-01-22 DIAGNOSIS — Z8249 Family history of ischemic heart disease and other diseases of the circulatory system: Secondary | ICD-10-CM | POA: Insufficient documentation

## 2016-01-22 DIAGNOSIS — C029 Malignant neoplasm of tongue, unspecified: Secondary | ICD-10-CM | POA: Diagnosis present

## 2016-01-22 DIAGNOSIS — E785 Hyperlipidemia, unspecified: Secondary | ICD-10-CM | POA: Insufficient documentation

## 2016-01-22 DIAGNOSIS — M549 Dorsalgia, unspecified: Secondary | ICD-10-CM | POA: Insufficient documentation

## 2016-01-22 DIAGNOSIS — C01 Malignant neoplasm of base of tongue: Secondary | ICD-10-CM

## 2016-01-22 DIAGNOSIS — F419 Anxiety disorder, unspecified: Secondary | ICD-10-CM | POA: Diagnosis not present

## 2016-01-22 DIAGNOSIS — G8929 Other chronic pain: Secondary | ICD-10-CM | POA: Diagnosis not present

## 2016-01-22 LAB — COMPREHENSIVE METABOLIC PANEL
ALK PHOS: 89 U/L (ref 38–126)
ALT: 19 U/L (ref 17–63)
ANION GAP: 9 (ref 5–15)
AST: 18 U/L (ref 15–41)
Albumin: 3.9 g/dL (ref 3.5–5.0)
BILIRUBIN TOTAL: 1 mg/dL (ref 0.3–1.2)
BUN: 15 mg/dL (ref 6–20)
CALCIUM: 9.1 mg/dL (ref 8.9–10.3)
CO2: 25 mmol/L (ref 22–32)
CREATININE: 1.01 mg/dL (ref 0.61–1.24)
Chloride: 104 mmol/L (ref 101–111)
GFR calc non Af Amer: >60 mL/min (ref >60)
Glucose, Bld: 114 mg/dL — ABNORMAL HIGH (ref 65–99)
Potassium: 4 mmol/L (ref 3.5–5.1)
Sodium: 138 mmol/L (ref 135–145)
TOTAL PROTEIN: 7.4 g/dL (ref 6.5–8.1)

## 2016-01-22 LAB — PROTIME-INR
INR: 1.04 (ref 0.00–1.49)
PROTHROMBIN TIME: 13.4 s (ref 11.6–15.2)

## 2016-01-22 LAB — CBC WITH DIFFERENTIAL/PLATELET
Basophils Absolute: 0.1 10*3/uL (ref 0.0–0.1)
Basophils Relative: 1 %
Eosinophils Absolute: 0.2 10*3/uL (ref 0.0–0.7)
Eosinophils Relative: 2 %
HEMATOCRIT: 46.5 % (ref 39.0–52.0)
HEMOGLOBIN: 15.2 g/dL (ref 13.0–17.0)
LYMPHS ABS: 1.2 10*3/uL (ref 0.7–4.0)
LYMPHS PCT: 12 %
MCH: 29.2 pg (ref 26.0–34.0)
MCHC: 32.7 g/dL (ref 30.0–36.0)
MCV: 89.4 fL (ref 78.0–100.0)
MONOS PCT: 8 %
Monocytes Absolute: 0.8 10*3/uL (ref 0.1–1.0)
NEUTROS ABS: 7.8 10*3/uL — AB (ref 1.7–7.7)
NEUTROS PCT: 77 %
Platelets: 226 10*3/uL (ref 150–400)
RBC: 5.2 MIL/uL (ref 4.22–5.81)
RDW: 13.6 % (ref 11.5–15.5)
WBC: 9.9 10*3/uL (ref 4.0–10.5)

## 2016-01-22 LAB — MAGNESIUM: Magnesium: 2 mg/dL (ref 1.7–2.4)

## 2016-01-22 LAB — APTT: aPTT: 32 seconds (ref 24–37)

## 2016-01-22 MED ORDER — MIDAZOLAM HCL 2 MG/2ML IJ SOLN
INTRAMUSCULAR | Status: AC | PRN
  Administered 2016-01-22 (×9): 1 mg via INTRAVENOUS

## 2016-01-22 MED ORDER — HYDROCODONE-ACETAMINOPHEN 5-325 MG PO TABS
1.0000 | ORAL_TABLET | ORAL | Status: DC | PRN
  Administered 2016-01-22: 1 via ORAL
  Filled 2016-01-22: qty 1

## 2016-01-22 MED ORDER — ONDANSETRON HCL 4 MG/2ML IJ SOLN
4.0000 mg | INTRAMUSCULAR | Status: DC | PRN
  Administered 2016-01-22: 4 mg via INTRAVENOUS
  Filled 2016-01-22 (×2): qty 2

## 2016-01-22 MED ORDER — FENTANYL CITRATE (PF) 100 MCG/2ML IJ SOLN
INTRAMUSCULAR | Status: AC
  Filled 2016-01-22: qty 4

## 2016-01-22 MED ORDER — HEPARIN SOD (PORK) LOCK FLUSH 100 UNIT/ML IV SOLN
INTRAVENOUS | Status: AC | PRN
  Administered 2016-01-22: 500 [IU]

## 2016-01-22 MED ORDER — HEPARIN SOD (PORK) LOCK FLUSH 100 UNIT/ML IV SOLN
INTRAVENOUS | Status: AC
  Filled 2016-01-22: qty 5

## 2016-01-22 MED ORDER — SODIUM CHLORIDE 0.9 % IV SOLN
INTRAVENOUS | Status: DC
  Administered 2016-01-22: 08:00:00 via INTRAVENOUS

## 2016-01-22 MED ORDER — HYDROMORPHONE HCL 2 MG/ML IJ SOLN
INTRAMUSCULAR | Status: AC
  Filled 2016-01-22: qty 1

## 2016-01-22 MED ORDER — MIDAZOLAM HCL 2 MG/2ML IJ SOLN
INTRAMUSCULAR | Status: AC
  Filled 2016-01-22: qty 6

## 2016-01-22 MED ORDER — LIDOCAINE-EPINEPHRINE 2 %-1:100000 IJ SOLN
INTRAMUSCULAR | Status: AC
  Filled 2016-01-22: qty 1

## 2016-01-22 MED ORDER — FENTANYL CITRATE (PF) 100 MCG/2ML IJ SOLN
INTRAMUSCULAR | Status: AC | PRN
  Administered 2016-01-22: 50 ug via INTRAVENOUS
  Administered 2016-01-22: 25 ug via INTRAVENOUS
  Administered 2016-01-22: 50 ug via INTRAVENOUS
  Administered 2016-01-22: 25 ug via INTRAVENOUS

## 2016-01-22 MED ORDER — GLUCAGON HCL RDNA (DIAGNOSTIC) 1 MG IJ SOLR
INTRAMUSCULAR | Status: AC | PRN
  Administered 2016-01-22: 1 mg via INTRAVENOUS

## 2016-01-22 MED ORDER — GLUCAGON HCL RDNA (DIAGNOSTIC) 1 MG IJ SOLR
INTRAMUSCULAR | Status: AC
  Filled 2016-01-22: qty 1

## 2016-01-22 MED ORDER — HYDROMORPHONE HCL 1 MG/ML IJ SOLN: 1.0000 mg | INTRAMUSCULAR | Status: DC | PRN

## 2016-01-22 MED ORDER — HYDROMORPHONE HCL 1 MG/ML IJ SOLN
INTRAMUSCULAR | Status: AC | PRN
  Administered 2016-01-22: 1 mg via INTRAVENOUS

## 2016-01-22 MED ORDER — LIDOCAINE HCL 1 % IJ SOLN
INTRAMUSCULAR | Status: AC
  Filled 2016-01-22: qty 20

## 2016-01-22 MED ORDER — CEFAZOLIN SODIUM-DEXTROSE 2-3 GM-% IV SOLR
INTRAVENOUS | Status: AC
  Filled 2016-01-22: qty 50

## 2016-01-22 MED ORDER — IOHEXOL 300 MG/ML  SOLN
5.0000 mL | Freq: Once | INTRAMUSCULAR | Status: AC | PRN
  Administered 2016-01-22: 5 mL

## 2016-01-22 MED ORDER — CEFAZOLIN SODIUM-DEXTROSE 2-3 GM-% IV SOLR
2.0000 g | Freq: Once | INTRAVENOUS | Status: AC
  Administered 2016-01-22: 2 g via INTRAVENOUS

## 2016-01-22 NOTE — H&P (Signed)
Chief Complaint: tongue cancer, needs PAC and feeding gastrostomy tube Referring Physician: Dr. Heath Lark HPI: [name] Best is an 55 y.o. male who was diagnosed with tongue cancer.  He is about to start chemotherapy and radiation and a request has been made for placement of a PAC and a feeding gastrostomy tube.  Past Medical History:  Past Medical History  Diagnosis Date  . Diverticulitis     hospitalized in 2003  . Chronic back pain   . Hyperlipidemia   . Anxiety   . PONV (postoperative nausea and vomiting)   . C. difficile diarrhea 2014    treated with Flagyl  . Cancer [facility])     Past Surgical History:  Past Surgical History  Procedure Laterality Date  . Gallbladder surgery    . Left elbow    . Colonoscopy N/A 07/02/2013    Jenkins:Sessile polyp (tubular adenoma) ranging between 3-82mm in size in the proximal sigmoid colon/mild diverticulosis   . Esophagogastroduodenoscopy N/A 07/02/2013    Jenkins:4 cm hiatal hernia/esophagus appeared normal/chronic gastritis. Clotest negative.  . Colonoscopy      Dr. Laural Golden: prior to 2003 per patient  . Knee arthroscopy with medial menisectomy Left 04/14/2015    Procedure: KNEE ARTHROSCOPY WITH MEDIAL MENISECTOMY;  Surgeon: Sanjuana Kava, MD;  Location: [facility];  Service: Orthopedics;  Laterality: Left;  . Cholecystectomy    . Direct laryngoscopy N/A 12/10/2015    Procedure: DIRECT LARYNGOSCOPY;  Surgeon: Leta Baptist, MD;  Location: [place];  Service: ENT;  Laterality: N/A;  . Mass biopsy Right 12/10/2015    Procedure: RIGHT EXCISIONAL NECK MASS BIOPSY;  Surgeon: Leta Baptist, MD;  Location: [place];  Service: ENT;  Laterality: Right;  . Tongue biopsy Right 12/10/2015    Procedure: TONGUE BIOPSY;  Surgeon: Leta Baptist, MD;  Location: [place];  Service: ENT;  Laterality: Right;    Family History:  Family History  Problem Relation Age of Onset  . Heart disease    . Colon cancer Neg Hx   . Colon  polyps Neg Hx   . Heart failure Mother 8  . Heart attack Father 27  . Cancer Paternal Uncle     died of cancer    Social History:  reports that he has never smoked. He has never used smokeless tobacco. He reports that he uses illicit drugs (Marijuana). He reports that he does not drink alcohol.  Allergies:  Allergies  Allergen Reactions  . Tetracyclines & Related Hives    Medications:   Medication List    ASK your doctor about these medications        dexamethasone 4 MG tablet  Commonly known as:  DECADRON  Take 2 tablets by mouth once a day on the day after chemotherapy daily for 2 days. Take with food.     diazepam 10 MG tablet  Commonly known as:  VALIUM  Take 10 mg by mouth every 6 (six) hours as needed for anxiety.     HYDROcodone-acetaminophen 5-325 MG tablet  Commonly known as:  NORCO/VICODIN  Take 1-2 tablets by mouth every 6 (six) hours as needed for moderate pain. Reported on 12/31/2015     lidocaine-prilocaine cream  Commonly known as:  EMLA  Apply to affected area once     ondansetron 8 MG tablet  Commonly known as:  ZOFRAN  Take 1 tablet (8 mg total) by mouth every 8 (eight) hours as needed. Start on the third day  after chemotherapy.     pravastatin 20 MG tablet  Commonly known as:  PRAVACHOL  Take 20 mg by mouth daily.     prochlorperazine 10 MG tablet  Commonly known as:  COMPAZINE  Take 1 tablet (10 mg total) by mouth every 6 (six) hours as needed (Nausea or vomiting).     sodium fluoride 1.1 % Gel dental gel  Commonly known as:  FLUORISHIELD  Instill one drop of gel per tooth space of tray. Place over teeth for 5 minutes. Remove. Spit out excess. Repeat nightly.        Please HPI for pertinent positives, otherwise complete 10 system ROS negative.  Mallampati Score: MD Evaluation Airway: WNL Heart: WNL Abdomen: WNL Chest/ Lungs: WNL ASA  Classification: 3 Mallampati/Airway Score: Two  Physical Exam: Ht 6\' 3"  (1.905 m)  Wt 275 lb 6.4  oz (124.921 kg)  BMI 34.42 kg/m2 Body mass index is 34.42 kg/(m^2).  General: pleasant, obese white male who is laying in bed in NAD Heart: regular, rate, and rhythm.  Normal s1,s2. No obvious murmurs, gallops, or rubs noted.  Palpable radial and pedal pulses bilaterally Lungs: CTAB, no wheezes, rhonchi, or rales noted.  Respiratory effort nonlabored Abd: soft, NT, ND, +BS, no masses, hernias, or organomegaly Skin: warm and dry with no masses, lesions, or rashes Psych: A&Ox3 with an appropriate affect.   Labs: No results found for this or any previous visit (from the past 48 hour(s)).  Imaging: No results found.  Assessment/Plan 1. Tongue cancer -will plan for placement of a PAC and feeding gastrostomy tube today to assist with chemotherapy and radiation therapy in his future. -Risks and Benefits discussed with the patient including, but not limited to the need for a barium enema during the procedure, bleeding, infection, peritonitis, or damage to adjacent structures. -Risks and Benefits discussed with the patient including, but not limited to bleeding, infection, pneumothorax, or fibrin sheath development and need for additional procedures.  All of the patient's questions were answered, patient is agreeable to proceed. Consent signed and in chart.   Thank you for this interesting consult.  I greatly enjoyed meeting [name] Best and look forward to participating in their care.  A copy of this report was sent to the requesting provider on this date.  Electronically Signed: Henreitta Cea 01/22/2016, 8:45 AM   I spent a total of  15 Minutes  in face to face in clinical consultation, greater than 50% of which was counseling/coordinating care for tongue cancer and placement of a port a cath and a feeding gastrostomy tube

## 2016-01-22 NOTE — Progress Notes (Signed)
  Oncology Nurse Navigator Documentation  Navigator Location: [facility] (01/22/16 1505)             Patient Visit Type: Inpatient (01/22/16 1505)   Barriers/Navigation Needs: Education (01/22/16 1505)   Interventions: Education Method (01/22/16 1505)     Education Method: Teach-back;Verbal;Demonstration (01/22/16 1505)    Specialty Items/DME: PEG care supplies (01/22/16 1505)     Met with Mr Ramona Slinger Short Stay 0712 to provide PEG education s/p PEG and port-a-cath placement.  His mother was at the bedside. Using PEG teaching device and Teach Back, provided education on PEG, including care of tube insertion site, S&S of infection, gravity bolus administration of nutritional supplement and free water, dsg change procedure. Provided patient the following PEG supplies:  2x2 gauze  Split gauze  Cotton-tip applicators  197 mL bottle of saline   Paper tape  Mesh brief  60 mL syringe Rn Manuela Schwartz provided Patient Education handouts (Elsevier) regarding PEG care and use. He understands he will be provided further guidance for PEG care and use by myself and other members of his Care Team.  [name], RN, BSN, [place] at [place] [phone]                  Time Spent with Patient: 30 (01/22/16 1505)

## 2016-01-22 NOTE — Discharge Instructions (Signed)
Gastrostomy Tube Home Guide, Adult °A gastrostomy tube is a tube that is surgically placed into the stomach. It is also called a "G-tube." G-tubes are used when a person is unable to eat and drink enough on their own to stay healthy. The tube is inserted into the stomach through a small cut (incision) in the skin. This tube is used for: °· Feeding. °· Giving medication. °GASTROSTOMY TUBE CARE °· Wash your hands with soap and water. °· Remove the old dressing (if any). Some styles of G-tubes may need a dressing inserted between the skin and the G-tube. Other types of G-tubes do not require a dressing. Ask your health care provider if a dressing is needed. °· Check the area where the tube enters the skin (insertion site) for redness, swelling, or pus-like (purulent) drainage. A small amount of clear or tan liquid drainage is normal. Check to make sure scar tissue (skin) is not growing around the insertion site. This could have a raised, bumpy appearance. °· A cotton swab can be used to clean the skin around the tube: °¨ When the G-tube is first put in, a normal saline solution or water can be used to clean the skin. °¨ Mild soap and warm water can be used when the skin around the G-tube site has healed. °¨ Roll the cotton swab around the G-tube insertion site to remove any drainage or crusting at the insertion site. °STOMACH RESIDUALS °Feeding tube residuals are the amount of liquids that are in the stomach at any given time. Residuals may be checked before giving feedings, medications, or as instructed by your health care provider. °· Ask your health care provider if there are instances when you would not start tube feedings depending on the amount or type of contents withdrawn from the stomach. °· Check residuals by attaching a syringe to the G-tube and pulling back on the syringe plunger. Note the amount, and return the residual back into the stomach. °FLUSHING THE G-TUBE °· The G-tube should be periodically  flushed with clean warm water to keep it from clogging. °¨ Flush the G-tube after feedings or medications. Draw up 30 mL of warm water in a syringe. Connect the syringe to the G-tube and slowly push the water into the tube. °¨ Do not push feedings, medications, or flushes rapidly. Flush the G-tube gently and slowly. °¨ Only use syringes made for G-tubes to flush medications or feedings. °¨ Your health care provider may want the G-tube flushed more often or with more water. If this is the case, follow your health care provider's instructions. °FEEDINGS °Your health care provider will determine whether feedings are given as a bolus (a certain amount given at one time and at scheduled times) or whether feedings will be given continuously on a feeding pump.  °· Formulas should be given at room temperature. °· If feedings are continuous, no more than 4 hours worth of feedings should be placed in the feeding bag. This helps prevent spoilage or accidental excess infusion. °· Cover and place unused formula in the refrigerator. °· If feedings are continuous, stop the feedings when medications or flushes are given. Be sure to restart the feedings. °· Feeding bags and syringes should be replaced as instructed by your health care provider. °GIVING MEDICATION  °· In general, it is best if all medications are in a liquid form for G-tube administration. Liquid medications are less likely to clog the G-tube. °¨ Mix the liquid medication with 30 mL (or amount recommended by   your health care provider) of warm water. °¨ Draw up the medication into the syringe. °¨ Attach the syringe to the G-tube and slowly push the mixture into the G-tube. °¨ After giving the medication, draw up 30 mL of warm water in the syringe and slowly flush the G-tube. °· For pills or capsules, check with your health care provider first before crushing medications. Some pills are not effective if they are crushed. Some capsules are sustained-release  medications. °¨ If appropriate, crush the pill or capsule and mix with 30 mL of warm water. Using the syringe, slowly push the medication through the tube, then flush the tube with another 30 mL of tap water. °G-TUBE PROBLEMS °G-tube was pulled out. °· Cause: May have been pulled out accidentally. °· Solutions: Cover the opening with clean dressing and tape. Call your health care provider right away. The G-tube should be put in as soon as possible (within 4 hours) so the G-tube opening (tract) does not close. The G-tube needs to be put in at a health care setting. An X-ray needs to be done to confirm placement before the G-tube can be used again. °Redness, irritation, soreness, or foul odor around the gastrostomy site. °· Cause: May be caused by leakage or infection. °· Solutions: Call your health care provider right away. °Large amount of leakage of fluid or mucus-like liquid present (a large amount means it soaks clothing). °· Cause: Many reasons could cause the G-tube to leak. °· Solutions: Call your health care provider to discuss the amount of leakage. °Skin or scar tissue appears to be growing where tube enters skin.  °· Cause: Tissue growth may develop around the insertion site if the G-tube is moved or pulled on excessively. °· Solutions: Secure tube with tape so that excess movement does not occur. Call your health care provider. °G-tube is clogged. °· Cause: Thick formula or medication. °· Solutions: Try to slowly push warm water into the tube with a large syringe. Never try to push any object into the tube to unclog it. Do not force fluid into the G-tube. If you are unable to unclog the tube, call your health care provider right away. °TIPS °· Head of bed (HOB) position refers to the upright position of a person's upper body. °¨ When giving medications or a feeding bolus, keep the HOB up as told by your health care provider. Do this during the feeding and for 1 hour after the feeding or medication  administration. °¨ If continuous feedings are being given, it is best to keep the HOB up as told by your health care provider. When ADLs (activities of daily living) are performed and the HOB needs to be flat, be sure to turn the feeding pump off. Restart the feeding pump when the HOB is returned to the recommended height. °· Do not pull or put tension on the tube. °· To prevent fluid backflow, kink the G-tube before removing the cap or disconnecting a syringe. °· Check the G-tube length every day. Measure from the insertion site to the end of the G-tube. If the length is longer than previous measurements, the tube may be coming out. Call your health care provider if you notice increasing G-tube length. °· Oral care, such as brushing teeth, must be continued. °· You may need to remove excess air (vent) from the G-tube. Your health care provider will tell you if this is needed. °· Always call your health care provider if you have questions or problems with the   G-tube. °SEEK IMMEDIATE MEDICAL CARE IF:  °· You have severe abdominal pain, tenderness, or abdominal bloating (distension). °· You have nausea or vomiting. °· You are constipated or have problems moving your bowels. °· The G-tube insertion site is red, swollen, has a foul smell, or has yellow or brown drainage. °· You have difficulty breathing or shortness of breath. °· You have a fever. °· You have a large amount of feeding tube residuals. °· The G-tube is clogged and cannot be flushed. °MAKE SURE YOU:  °· Understand these instructions. °· Will watch your condition. °· Will get help right away if you are not doing well or get worse. °  °This information is not intended to replace advice given to you by your health care provider. Make sure you discuss any questions you have with your health care provider. °  °Document Released: 02/20/2002 Document Revised: 04/28/2015 Document Reviewed: 08/19/2013 °Elsevier Interactive Patient Education ©2016 [employer] °Implanted Port Home Guide °An implanted port is a type of central line that is placed under the skin. Central lines are used to provide IV access when treatment or nutrition needs to be given through a person's veins. Implanted ports are used for long-term IV access. An implanted port may be placed because:  °· You need IV medicine that would be irritating to the small veins in your hands or arms.   °· You need long-term IV medicines, such as antibiotics.   °· You need IV nutrition for a long period.   °· You need frequent blood draws for lab tests.   °· You need dialysis.   °Implanted ports are usually placed in the chest area, but they can also be placed in the upper arm, the abdomen, or the leg. An implanted port has two main parts:  °· Reservoir. The reservoir is round and will appear as a small, raised area under your skin. The reservoir is the part where a needle is inserted to give medicines or draw blood.   °· Catheter. The catheter is a thin, flexible tube that extends from the reservoir. The catheter is placed into a large vein. Medicine that is inserted into the reservoir goes into the catheter and then into the vein.   °HOW WILL I CARE FOR MY INCISION SITE? °Do not get the incision site wet. Bathe or shower as directed by your health care provider.  °HOW IS MY PORT ACCESSED? °Special steps must be taken to access the port:  °· Before the port is accessed, a numbing cream can be placed on the skin. This helps numb the skin over the port site.   °· Your health care provider uses a sterile technique to access the port. °· Your health care provider must put on a mask and sterile gloves. °· The skin over your port is cleaned carefully with an antiseptic and allowed to dry. °· The port is gently pinched between sterile gloves, and a needle is inserted into the port. °· Only "non-coring" port needles should be used to access the port. Once the port is accessed, a blood return should be checked. This helps  ensure that the port is in the vein and is not clogged.   °· If your port needs to remain accessed for a constant infusion, a clear (transparent) bandage will be placed over the needle site. The bandage and needle will need to be changed every week, or as directed by your health care provider.   °· Keep the bandage covering the needle clean and dry. Do not get it wet. Follow   your health care provider's instructions on how to take a shower or bath while the port is accessed.   °· If your port does not need to stay accessed, no bandage is needed over the port.   °WHAT IS FLUSHING? °Flushing helps keep the port from getting clogged. Follow your health care provider's instructions on how and when to flush the port. Ports are usually flushed with saline solution or a medicine called heparin. The need for flushing will depend on how the port is used.  °· If the port is used for intermittent medicines or blood draws, the port will need to be flushed:   °¨ After medicines have been given.   °¨ After blood has been drawn.   °¨ As part of routine maintenance.   °· If a constant infusion is running, the port may not need to be flushed.   °HOW LONG WILL MY PORT STAY IMPLANTED? °The port can stay in for as long as your health care provider thinks it is needed. When it is time for the port to come out, surgery will be done to remove it. The procedure is similar to the one performed when the port was put in.  °WHEN SHOULD I SEEK IMMEDIATE MEDICAL CARE? °When you have an implanted port, you should seek immediate medical care if:  °· You notice a bad smell coming from the incision site.   °· You have swelling, redness, or drainage at the incision site.   °· You have more swelling or pain at the port site or the surrounding area.   °· You have a fever that is not controlled with medicine. °  °This information is not intended to replace advice given to you by your health care provider. Make sure you discuss any questions you have with  your health care provider. °  °Document Released: 12/12/2005 Document Revised: 10/02/2013 Document Reviewed: 08/19/2013 °Elsevier Interactive Patient Education ©2016 [employer] °Implanted Port Insertion, Care After °Refer to this sheet in the next few weeks. These instructions provide you with information on caring for yourself after your procedure. Your health care provider may also give you more specific instructions. Your treatment has been planned according to current medical practices, but problems sometimes occur. Call your health care provider if you have any problems or questions after your procedure. °WHAT TO EXPECT AFTER THE PROCEDURE °After your procedure, it is typical to have the following:  °· Discomfort at the port insertion site. Ice packs to the area will help. °· Bruising on the skin over the port. This will subside in 3-4 days. °HOME CARE INSTRUCTIONS °· After your port is placed, you will get a manufacturer's information card. The card has information about your port. Keep this card with you at all times.   °· Know what kind of port you have. There are many types of ports available.   °· Wear a medical alert bracelet in case of an emergency. This can help alert health care workers that you have a port.   °· The port can stay in for as long as your health care provider believes it is necessary.   °· A home health care nurse may give medicines and take care of the port.   °· You or a family member can get special training and directions for giving medicine and taking care of the port at home.   °SEEK MEDICAL CARE IF:  °· Your port does not flush or you are unable to get a blood return.   °· You have a fever or chills. °SEEK IMMEDIATE MEDICAL CARE IF: °· You   have new fluid or pus coming from your incision.   °· You notice a bad smell coming from your incision site.   °· You have swelling, pain, or more redness at the incision or port site.   °· You have chest pain or shortness of breath. °  °This  information is not intended to replace advice given to you by your health care provider. Make sure you discuss any questions you have with your health care provider. °  °Document Released: 10/02/2013 Document Revised: 12/17/2013 Document Reviewed: 10/02/2013 °Elsevier Interactive Patient Education ©2016 [employer] °Moderate Conscious Sedation, Adult °Sedation is the use of medicines to promote relaxation and relieve discomfort and anxiety. Moderate conscious sedation is a type of sedation. Under moderate conscious sedation you are less alert than normal but are still able to respond to instructions or stimulation. Moderate conscious sedation is used during short medical and dental procedures. It is milder than deep sedation or general anesthesia and allows you to return to your regular activities sooner. °LET YOUR HEALTH CARE PROVIDER KNOW ABOUT:  °· Any allergies you have. °· All medicines you are taking, including vitamins, herbs, eye drops, creams, and over-the-counter medicines. °· Use of steroids (by mouth or creams). °· Previous problems you or members of your family have had with the use of anesthetics. °· Any blood disorders you have. °· Previous surgeries you have had. °· Medical conditions you have. °· Possibility of pregnancy, if this applies. °· Use of cigarettes, alcohol, or illegal drugs. °RISKS AND COMPLICATIONS °Generally, this is a safe procedure. However, as with any procedure, problems can occur. Possible problems include: °· Oversedation. °· Trouble breathing on your own. You may need to have a breathing tube until you are awake and breathing on your own. °· Allergic reaction to any of the medicines used for the procedure. °BEFORE THE PROCEDURE °· You may have blood tests done. These tests can help show how well your kidneys and liver are working. They can also show how well your blood clots. °· A physical exam will be done.   °· Only take medicines as directed by your health care provider.  You may need to stop taking medicines (such as blood thinners, aspirin, or nonsteroidal anti-inflammatory drugs) before the procedure.   °· Do not eat or drink at least 6 hours before the procedure or as directed by your health care provider. °· Arrange for a responsible adult, family member, or friend to take you home after the procedure. He or she should stay with you for at least 24 hours after the procedure, until the medicine has worn off. °PROCEDURE  °· An intravenous (IV) catheter will be inserted into one of your veins. Medicine will be able to flow directly into your body through this catheter. You may be given medicine through this tube to help prevent pain and help you relax. °· The medical or dental procedure will be done. °AFTER THE PROCEDURE °· You will stay in a recovery area until the medicine has worn off. Your blood pressure and pulse will be checked.   °·  Depending on the procedure you had, you may be allowed to go home when you can tolerate liquids and your pain is under control. °  °This information is not intended to replace advice given to you by your health care provider. Make sure you discuss any questions you have with your health care provider. °  °Document Released: 09/06/2001 Document Revised: 01/02/2015 Document Reviewed: 08/19/2013 °Elsevier Interactive Patient Education ©2016 [employer] ° °  Interactive Patient Education ©2016 [employer]. ° °

## 2016-01-22 NOTE — Procedures (Signed)
Placement of right jugular portacath.  Tip at SVC/RA junction. Placement of percutaneous gastrostomy.  60 French tube placed with two gastropexy T-tacks. Minimal blood loss and no immediate complication. See Radiology procedure note.

## 2016-01-25 ENCOUNTER — Encounter: Payer: Self-pay | Admitting: *Deleted

## 2016-01-25 ENCOUNTER — Encounter: Payer: Self-pay | Admitting: Radiation Oncology

## 2016-01-25 ENCOUNTER — Ambulatory Visit (HOSPITAL_BASED_OUTPATIENT_CLINIC_OR_DEPARTMENT_OTHER): Payer: Commercial Managed Care - HMO

## 2016-01-25 ENCOUNTER — Ambulatory Visit (HOSPITAL_COMMUNITY): Payer: Self-pay | Admitting: Dentistry

## 2016-01-25 ENCOUNTER — Encounter (HOSPITAL_COMMUNITY): Payer: Self-pay | Admitting: Dentistry

## 2016-01-25 ENCOUNTER — Ambulatory Visit
Admission: RE | Admit: 2016-01-25 | Discharge: 2016-01-25 | Disposition: A | Discharge status: Home / Self Care | Payer: Commercial Managed Care - HMO | Source: Ambulatory Visit | Attending: Radiation Oncology | Admitting: Radiation Oncology

## 2016-01-25 ENCOUNTER — Ambulatory Visit: Payer: Commercial Managed Care - HMO | Admitting: Nutrition

## 2016-01-25 VITALS — BP 149/101 | HR 102 | Temp 97.8°F

## 2016-01-25 VITALS — BP 139/100 | HR 91 | Temp 97.1°F | Resp 20

## 2016-01-25 VITALS — BP 143/99 | HR 112 | Temp 97.7°F | Ht 75.0 in | Wt 264.6 lb

## 2016-01-25 DIAGNOSIS — C01 Malignant neoplasm of base of tongue: Secondary | ICD-10-CM

## 2016-01-25 DIAGNOSIS — Z463 Encounter for fitting and adjustment of dental prosthetic device: Secondary | ICD-10-CM

## 2016-01-25 DIAGNOSIS — Z5111 Encounter for antineoplastic chemotherapy: Secondary | ICD-10-CM

## 2016-01-25 DIAGNOSIS — Z510 Encounter for antineoplastic radiation therapy: Secondary | ICD-10-CM | POA: Diagnosis not present

## 2016-01-25 DIAGNOSIS — Z01818 Encounter for other preprocedural examination: Secondary | ICD-10-CM

## 2016-01-25 MED ORDER — CISPLATIN CHEMO INJECTION 100MG/100ML
100.0000 mg/m2 | Freq: Once | INTRAVENOUS | Status: AC
  Administered 2016-01-25: 257 mg via INTRAVENOUS
  Filled 2016-01-25: qty 257

## 2016-01-25 MED ORDER — POTASSIUM CHLORIDE 2 MEQ/ML IV SOLN
Freq: Once | INTRAVENOUS | Status: AC
  Administered 2016-01-25: 10:00:00 via INTRAVENOUS
  Filled 2016-01-25: qty 10

## 2016-01-25 MED ORDER — PALONOSETRON HCL INJECTION 0.25 MG/5ML
0.2500 mg | Freq: Once | INTRAVENOUS | Status: AC
  Administered 2016-01-25: 0.25 mg via INTRAVENOUS

## 2016-01-25 MED ORDER — SODIUM CHLORIDE 0.9 % IJ SOLN
10.0000 mL | INTRAMUSCULAR | Status: DC | PRN
  Administered 2016-01-25: 10 mL
  Filled 2016-01-25: qty 10

## 2016-01-25 MED ORDER — SODIUM CHLORIDE 0.9 % IV SOLN
Freq: Once | INTRAVENOUS | Status: AC
  Administered 2016-01-25: 12:00:00 via INTRAVENOUS
  Filled 2016-01-25: qty 5

## 2016-01-25 MED ORDER — HEPARIN SOD (PORK) LOCK FLUSH 100 UNIT/ML IV SOLN
500.0000 [IU] | Freq: Once | INTRAVENOUS | Status: AC | PRN
  Administered 2016-01-25: 500 [IU]
  Filled 2016-01-25: qty 5

## 2016-01-25 MED ORDER — PALONOSETRON HCL INJECTION 0.25 MG/5ML
INTRAVENOUS | Status: AC
  Filled 2016-01-25: qty 5

## 2016-01-25 MED ORDER — SONAFINE EX EMUL
1.0000 "application " | Freq: Two times a day (BID) | CUTANEOUS | Status: DC
  Administered 2016-01-25: 1 via TOPICAL

## 2016-01-25 NOTE — Progress Notes (Signed)
Nutrition follow-up completed with patient during infusion her tongue cancer  Patient is status post PEG placement  He reports pain at feeding tube site  He reports pain when eating. He has been consuming very soft foods and liquids  Reports he flushes feeding tube daily and does not have any problems taking care of it   Nutrition diagnosis: Predicted suboptimal energy intake continues.  Intervention: e Encouraged patient to continue strategies for increased calories and protein  Reviewed importance of feeding tube maintenance  Teachback method was used.  Monitoring, evaluation, goals: Patient will tolerate adequate calories and protein to minimize loss of lean body mass.  Next visit:  Wednesday February 8 after radiation therapy.  **Disclaimer: This note was dictated with voice recognition software. Similar sounding words can inadvertently be transcribed and this note may contain transcription errors which may not have been corrected upon publication of note.**

## 2016-01-25 NOTE — Progress Notes (Signed)
  Oncology Nurse Navigator Documentation  Navigator Location: [facility]-Med Onc (01/25/16 1020) Navigator Encounter Type: Treatment (01/25/16 1020)           Patient Visit Type: MedOnc (01/25/16 1020) Treatment Phase: First Chemo Tx (01/25/16 1020)     Met with Mr Gasaway in Infusion where he was receiving his first chemo treatment.  His wife Shirlean Mylar was with him.  He reported:  Doing well with first infusion.  He managed his PEG without difficulty over the weekend.  Took 1/2 Vicodin as prescribed over the weekend for insertion site pain, "Much better today".  Flushed with 2 syringes water daily gravity technique, changed dsg daily after showering without difficulty. He understands his 2:40 PM first RT might be adjusted today depending on the status of his chemo tmt.  I later informed Tomo that he is receiving chemo today, that RN Juliann Pulse will be contacting them re appt adjustment.  [name], RN, BSN, [place] [place] [phone]                            Time Spent with Patient: 15 (01/25/16 1020)

## 2016-01-25 NOTE — Progress Notes (Signed)
   Weekly Management Note:  Outpatient    ICD-9-CM ICD-10-CM   1. Cancer of base of tongue (HCC) 141.0 C01     Current Dose:  2 Gy  Projected Dose: 70 Gy   Narrative:  The patient presents for routine under treatment assessment.  CBCT/MVCT images/Port film x-rays were reviewed.  The chart was checked. Doing relatively well.  Started chemotherapy today as well.  Physical Findings:  Wt Readings from Last 3 Encounters:  01/25/16 264 lb 9.6 oz (120.022 kg)  01/22/16 275 lb 6.4 oz (124.921 kg)  01/13/16 275 lb 6.4 oz (124.921 kg)   Vitals with BMI 01/25/2016 01/25/2016  Height  6\' 3"   Weight  264 lbs 10 oz  BMI  A999333  Systolic A999333 A999333  Diastolic 99 A999333  Pulse [id number]  Respirations      No oral thrush or mucosal tumor visible.  Skin without irritation.  Scar on neck without drainage  CBC    Component Value Date/Time   WBC 9.9 01/22/2016 0800   RBC 5.20 01/22/2016 0800   HGB 15.2 01/22/2016 0800   HCT 46.5 01/22/2016 0800   PLT 226 01/22/2016 0800   MCV 89.4 01/22/2016 0800   MCH 29.2 01/22/2016 0800   MCHC 32.7 01/22/2016 0800   RDW 13.6 01/22/2016 0800   LYMPHSABS 1.2 01/22/2016 0800   MONOABS 0.8 01/22/2016 0800   EOSABS 0.2 01/22/2016 0800   BASOSABS 0.1 01/22/2016 0800     CMP     Component Value Date/Time   NA 138 01/22/2016 0800   K 4.0 01/22/2016 0800   CL 104 01/22/2016 0800   CO2 25 01/22/2016 0800   GLUCOSE 114* 01/22/2016 0800   BUN 15 01/22/2016 0800   CREATININE 1.01 01/22/2016 0800   CALCIUM 9.1 01/22/2016 0800   PROT 7.4 01/22/2016 0800   ALBUMIN 3.9 01/22/2016 0800   AST 18 01/22/2016 0800   ALT 19 01/22/2016 0800   ALKPHOS 89 01/22/2016 0800   BILITOT 1.0 01/22/2016 0800   GFRNONAA >60 01/22/2016 0800   GFRAA >60 01/22/2016 0800     Impression:  The patient is tolerating radiotherapy.   Plan:  Continue radiotherapy as planned.  Discussed baking soda gargles. -----------------------------------  Eppie Gibson, MD

## 2016-01-25 NOTE — Progress Notes (Signed)
IMRT Device Note    ICD-9-CM ICD-10-CM   1. Cancer of base of tongue (HCC) 141.0 C01 SONAFINE emulsion 1 application    [id number] delivered field widths represent one set of IMRT treatment devices. The code is [phone].  -----------------------------------  Eppie Gibson, MD

## 2016-01-25 NOTE — Patient Instructions (Addendum)
Plan/recommendations: 1. Brush teeth after meals and at bedtime. Floss at bedtime. 2. Use fluoride therapy in trays as instructed. 3. Use trismus device as instructed with current maximum interincisal opening measurement. 4. Use salt water and baking soda rinses or Biotene rinses as needed. 5. Maintain adequate nutrition as per nutritional consultation. 6. Return to clinic for oral examination during chemoradiation therapy in 2 weeks as scheduled. 7. Call if questions or problems arise before then.  Lenn Cal, DDS   FLUORIDE TRAYS PATIENT INSTRUCTIONS    Obtain prescription from the pharmacy.  Don't be surprised if it needs to be ordered.  Be sure to let the pharmacy know when you are close to needing a new refill for them to have it ready for you without interruption of Fluoride use.  The best time to use your Fluoride is before bed time.  You must brush your teeth very well and floss before using the Fluoride in order to get the best use out of the Fluoride treatments.  Place 1 drop of Fluoride gel per tooth in the tray.  Place the tray on your lower teeth and your upper teeth.  Make sure the trays are seated all the way.  Remember, they only fit one way on your teeth.  Insert for 5 full minutes.  At the end of the 5 minutes, take the trays out.  SPIT OUT excess. .  Do NOT rinse your mouth!  Do NOT eat or drink after treatments for at least 30 minutes.  This is why the best time for your treatments is before bedtime.  Clean the inside of your Fluoride trays using COLD WATER and a toothbrush.  In order to keep your Trays from discoloring and free from odors, soak them overnight in denture cleaners such as Efferdent.  Do not use bleach or non denture products.  Store the trays in a safe dry place AWAY from any heat until your next treatment.  If anything happens to your Fluoride trays, or they don't fit as well after any dental work, please let us know as soon as  possible.  TRISMUS  Trismus is a condition where the jaw does not allow the mouth to open as wide as it usually does.  This can happen almost suddenly, or in other cases the process is so slow, it is hard to notice it-until it is too far along.  When the jaw joints and/or muscles have been exposed to radiation treatments, the onset of Trismus is very slow.  This is because the muscles are losing their stretching ability over a long period of time, as long as 2 YEARS after the end of radiation.  It is therefore important to exercise these muscles and joints.  TRISMUS EXERCISES   Stack of tongue depressors measuring the same or a little less than the last documented MIO (Maximum Interincisal Opening).  Secure them with a rubber band on both ends.  Place the stack in the patient's mouth, supporting the other end.  Allow 30 seconds for muscle stretching.  Rest for a few seconds.  Repeat 3-5 times  For all radiation patients, this exercise is recommended in the mornings and evenings unless otherwise instructed.  The exercise should be done for a period of 2 YEARS after the end of radiation.  MIO should be checked routinely on recall dental visits by the general dentist or the hospital dentist.  The patient is advised to report any changes, soreness, or difficulties encountered when doing the  exercises.

## 2016-01-25 NOTE — Patient Instructions (Signed)
[facility] Discharge Instructions for Patients Receiving Chemotherapy  Today you received the following chemotherapy agents Cisplatin  To help prevent nausea and vomiting after your treatment, we encourage you to take your nausea medication tonight and tomorrow AM then evaluate how you feel and continue if needed.   If you develop nausea and vomiting that is not controlled by your nausea medication, call the clinic.   BELOW ARE SYMPTOMS THAT SHOULD BE REPORTED IMMEDIATELY:  *FEVER GREATER THAN 100.5 F  *CHILLS WITH OR WITHOUT FEVER  NAUSEA AND VOMITING THAT IS NOT CONTROLLED WITH YOUR NAUSEA MEDICATION  *UNUSUAL SHORTNESS OF BREATH  *UNUSUAL BRUISING OR BLEEDING  TENDERNESS IN MOUTH AND THROAT WITH OR WITHOUT PRESENCE OF ULCERS  *URINARY PROBLEMS  *BOWEL PROBLEMS  UNUSUAL RASH Items with * indicate a potential emergency and should be followed up as soon as possible.  Feel free to call the clinic you have any questions or concerns. The clinic phone number is [phone].  Please show the [facility] at check-in to the Emergency Department and triage nurse.

## 2016-01-25 NOTE — Progress Notes (Signed)
[name] Best received his first fraction today to his neck.  He denies any pain at this time.  He received chemo today and states he feels hyped up since getting Dexamethasone.  BP elevated at 157/110 with pulse of 116 on initial presentation to exam room.  After assessing him his BP lowered to 143 and pulse 112 with continued steady strong pulse.  He also admits to be "somewhat" nervous and anxious at this time.  Spouse advised to monitor him and report any discomfort to Medical Oncology tonight.   Given the Radiation Therapy and You booklet to review and will be educated on tomorrow.  Given Biafine with instructions for use BID to TID, but not to apply less than 4 hours prior to treatment daily and he and spouse stated understanding.

## 2016-01-25 NOTE — Progress Notes (Signed)
01/25/2016  Patient Name:   [name] Best Date of Birth:   [birth year] Medical Record Number: [id number]  BP 149/101 mmHg  Pulse 102  Temp(Src) 97.8 F (36.6 C) (Oral)  [name] now presents for insertion of upper and lower fluoride trays and scatter protection devices. Patient had dental extraction of tooth #31 on 01/06/2016 and subsequent dental cleaning and multiple restorations on 01/12/2016 with Dr. Seward Grater.  SUBJECTIVE: The patient denies any significant dental pain from the dental extraction #31. Patient had feeding tube and Port-A-Cath placed last Friday. Patient is having some discomfort from the feeding tube placement. The patient is currently scheduled to start chemoradiation therapy-today. Patient has already picked up his FluoriSHIELD prescription.  OBJECTIVE: There is no sign of oral infection, heme, or ooze. No sutures remain. Extraction site #31 is healing in by secondary intention with no obvious delayed healing. Patient has relatively good oral hygiene. Patient has a decreased maximum interincisal opening of 26 mm-today.  PROCEDURE: Appliances were tried in and adjusted as needed. [place] Trismus device was previously fabricated at 46 mm using 26 sticks. Patient was instructed to remove sticks from the trismus device to current maximum interincisal opening. Patient expresses understanding. Postop instructions were provided and a written and verbal format concerning the use and care of appliances. All questions were answered.  ASSESSMENT: Status post extraction of tooth #31 with his primary dentist. 01-06-16. Status post dental cleaning and multiple restorations with his primary dentist. 01-12-16 Patient is cleared to start chemoradiation therapy.  Plan/recommendations: 1. Brush teeth after meals and at bedtime. Floss at bedtime. 2. Use fluoride therapy in trays as instructed. 3. Use trismus device as instructed with current maximum interincisal opening  measurement. 4. Use salt water and baking soda rinses or Biotene rinses as needed. 5. Maintain adequate nutrition as per nutritional consultation. 6. Return to clinic for oral examination during chemoradiation therapy in 2 weeks as scheduled. 7. Call if questions or problems arise before then.  Lenn Cal, DDS

## 2016-01-26 ENCOUNTER — Ambulatory Visit: Payer: Commercial Managed Care - HMO

## 2016-01-26 ENCOUNTER — Ambulatory Visit
Admission: RE | Admit: 2016-01-26 | Discharge: 2016-01-26 | Disposition: A | Discharge status: Home / Self Care | Payer: Commercial Managed Care - HMO | Source: Ambulatory Visit | Attending: Radiation Oncology | Admitting: Radiation Oncology

## 2016-01-26 ENCOUNTER — Telehealth: Payer: Self-pay | Admitting: *Deleted

## 2016-01-26 NOTE — Progress Notes (Signed)
  Oncology Nurse Navigator Documentation  Navigator Location: [facility] (01/25/16 1635) Navigator Encounter Type: Treatment (01/25/16 1635)           Patient Visit Type: MCEYEM (01/25/16 1635) Treatment Phase: First Radiation Tx (01/25/16 1635)     Interventions: Education Method (01/25/16 1635)     Education Method: Verbal (01/25/16 1635)      Met with [name] Best and his wife during his [facility] tomotherapy. I reviewed the treatment procedure including pre-tmt CT.  He verbalized understanding. His wife observed tmt, expressed appreciation for explanation and opportunity to watch. He managed first tmt without incident. He denied any post-chemo issues at this time.  [name], RN, BSN, [place] at [place] [phone]               Time Spent with Patient: 45 (01/25/16 1635)

## 2016-01-26 NOTE — Telephone Encounter (Signed)
  Oncology Nurse Navigator Documentation  Navigator Location: [facility] (01/26/16 1504) Navigator Encounter Type: Telephone (01/26/16 1504) Telephone: Education (01/26/16 1504)          Called [name] Best to check on his well being in follow-up to his cancellation of today's Tomo tmt. He stated:  "My head has been spinning", feeling nauseous though he denies emesis. Lying down most helpful.  Took antiemetics as prescribed but minimal relief; decadron as well.  Will definitely be present for tmt tomorrow. I educated him on the importance of keeping appts and avoiding interruptions in tmt in order to maximize benefit of therapy, to arrive even if not feeling well.  He verbalized understanding.  [name], RN, BSN, [place] at [place] [phone]                                    Time Spent with Patient: 15 (01/26/16 1504)

## 2016-01-27 ENCOUNTER — Ambulatory Visit
Admission: RE | Admit: 2016-01-27 | Discharge: 2016-01-27 | Disposition: A | Discharge status: Home / Self Care | Payer: Commercial Managed Care - HMO | Source: Ambulatory Visit | Attending: Radiation Oncology | Admitting: Radiation Oncology

## 2016-01-27 DIAGNOSIS — Z510 Encounter for antineoplastic radiation therapy: Secondary | ICD-10-CM | POA: Diagnosis not present

## 2016-01-28 ENCOUNTER — Ambulatory Visit
Admission: RE | Admit: 2016-01-28 | Discharge: 2016-01-28 | Disposition: A | Discharge status: Home / Self Care | Payer: Commercial Managed Care - HMO | Source: Ambulatory Visit | Attending: Radiation Oncology | Admitting: Radiation Oncology

## 2016-01-28 ENCOUNTER — Encounter: Payer: Self-pay | Admitting: *Deleted

## 2016-01-28 DIAGNOSIS — Z510 Encounter for antineoplastic radiation therapy: Secondary | ICD-10-CM | POA: Diagnosis not present

## 2016-01-29 ENCOUNTER — Telehealth: Payer: Self-pay | Admitting: Hematology and Oncology

## 2016-01-29 ENCOUNTER — Telehealth: Payer: Self-pay | Admitting: *Deleted

## 2016-01-29 ENCOUNTER — Ambulatory Visit: Payer: Self-pay

## 2016-01-29 ENCOUNTER — Ambulatory Visit: Payer: Self-pay | Admitting: Hematology and Oncology

## 2016-01-29 ENCOUNTER — Ambulatory Visit
Admission: RE | Admit: 2016-01-29 | Discharge: 2016-01-29 | Disposition: A | Discharge status: Home / Self Care | Payer: Commercial Managed Care - HMO | Source: Ambulatory Visit | Attending: Radiation Oncology | Admitting: Radiation Oncology

## 2016-01-29 ENCOUNTER — Other Ambulatory Visit: Payer: Self-pay | Admitting: Hematology and Oncology

## 2016-01-29 ENCOUNTER — Ambulatory Visit: Payer: Commercial Managed Care - HMO

## 2016-01-29 ENCOUNTER — Other Ambulatory Visit: Payer: Self-pay | Admitting: *Deleted

## 2016-01-29 DIAGNOSIS — E860 Dehydration: Secondary | ICD-10-CM

## 2016-01-29 NOTE — Telephone Encounter (Signed)
I received this message from my Navigator that the patient is not doing well. Apparently, the patient had refused to come in for assessment and treatment/management of nausea and dehydration. I attempted to call the patient myself but not able to get hold of him.

## 2016-01-29 NOTE — Telephone Encounter (Signed)
  Oncology Nurse Navigator Documentation Navigator Location: [facility] (01/29/16 1314) Navigator Encounter Type: Telephone (01/29/16 1314) Telephone: Education;Symptom Mgt (01/29/16 1314)                       Education Method: Verbal (01/29/16 1314)                Time Spent with Patient: 15 (01/29/16 1314)    Called [name] Best to check on his well-being upon notification of his self-cancelled Tomo tmt for today. He reported "I've been sick as a dog, no way I could make it in".  He noted:  Went to bed with a headache last HS, was still present this morning.  Having diarrhea "clear yellow", anything he eats "goes right through".  He is tolerating water and crackers.  Experiencing nausea with dry heaves, denies emesis.  Temp of 99.2 measured a short while ago.  Drinking "lots of water".  (I encouraged him to drink as much as tolerable.)  He feels the best if he is lying down, "i get sick when ever I sit up".  Medications he has do not alleviate symptoms. I encouraged him to arrive to [facility] for evaluation by either Dr Alvy Bimler or Dr [name] regardless of his ability to complete RT.  He refused this offer, "I'll just tough it out". I explained again, as I did earlier this week: when he missed tmt:  Symptoms should be evaluated by an MD when he feels as bad as he's reported twice this week.  Breaks in RT continuity jeopardize tmt efficacy. He verbalized understanding, refused a second time to come in for evaluation; "I will definitely be there Monday". I routed this update to Drs. Alvy Bimler and [name].  [name], RN, BSN, [place] at [place] [phone]

## 2016-01-29 NOTE — Telephone Encounter (Signed)
Rick, I have asked Cameo to get him to be seen now with IVF and IV antiemetics

## 2016-01-31 NOTE — Progress Notes (Signed)
  Oncology Nurse Navigator Documentation  Navigator Location: [facility] (01/28/16 1140) Navigator Encounter Type: Treatment (01/28/16 1140)           Patient Visit Type: YOKHTX (01/28/16 1140)   Barriers/Navigation Needs: Education (01/28/16 1140)       Met with Mr Darroch following his Tomo tmt.  He was accompanied by his wife. He reported:  No further issues with nausea.  Completed past 2 days of RT without difficulty.  Concern that PEG tube "is getting longer".  He denied any pain associated at insertion site, redness, discharge. Upon my examination in Tomo dsg room, dsg intact, no signs of drainage. Tube  appeared to be an inch or 2 longer than what I remembered when I saw it post placement.  I offered to take him to exam room to evaluate further, he declined offer stating he would rather wait until tomorrow.  I encouraged him to mark tube at point of insertion when dsg next changed.  He verbalized understanding.   I acknowledged his completion of tmts over the past 2 days, encouraged him to continue with compliance. He understands I can be contact with needs/concerns.  [name], RN, BSN, [place] at [place] [phone]                       Time Spent with Patient: 15 (01/28/16 1140)

## 2016-02-01 ENCOUNTER — Telehealth: Payer: Self-pay | Admitting: Hematology and Oncology

## 2016-02-01 ENCOUNTER — Ambulatory Visit
Admission: RE | Admit: 2016-02-01 | Discharge: 2016-02-01 | Disposition: A | Discharge status: Home / Self Care | Payer: Commercial Managed Care - HMO | Source: Ambulatory Visit | Attending: Radiation Oncology | Admitting: Radiation Oncology

## 2016-02-01 ENCOUNTER — Ambulatory Visit (HOSPITAL_BASED_OUTPATIENT_CLINIC_OR_DEPARTMENT_OTHER): Payer: Commercial Managed Care - HMO

## 2016-02-01 ENCOUNTER — Ambulatory Visit (HOSPITAL_BASED_OUTPATIENT_CLINIC_OR_DEPARTMENT_OTHER): Payer: Commercial Managed Care - HMO | Admitting: Hematology and Oncology

## 2016-02-01 ENCOUNTER — Encounter: Payer: Self-pay | Admitting: Radiation Oncology

## 2016-02-01 ENCOUNTER — Encounter: Payer: Self-pay | Admitting: Hematology and Oncology

## 2016-02-01 VITALS — BP 144/96 | HR 79 | Temp 98.4°F | Ht 75.0 in | Wt 258.2 lb

## 2016-02-01 VITALS — BP 136/91 | HR 85 | Temp 98.1°F | Resp 18 | Ht 75.0 in | Wt 257.9 lb

## 2016-02-01 DIAGNOSIS — H9313 Tinnitus, bilateral: Secondary | ICD-10-CM

## 2016-02-01 DIAGNOSIS — C01 Malignant neoplasm of base of tongue: Secondary | ICD-10-CM

## 2016-02-01 DIAGNOSIS — Z510 Encounter for antineoplastic radiation therapy: Secondary | ICD-10-CM | POA: Diagnosis not present

## 2016-02-01 DIAGNOSIS — N179 Acute kidney failure, unspecified: Secondary | ICD-10-CM

## 2016-02-01 LAB — COMPREHENSIVE METABOLIC PANEL
ALT: 54 U/L (ref 0–55)
ANION GAP: 11 meq/L (ref 3–11)
AST: 27 U/L (ref 5–34)
Albumin: 3.6 g/dL (ref 3.5–5.0)
Alkaline Phosphatase: 87 U/L (ref 40–150)
BUN: 30.2 mg/dL — ABNORMAL HIGH (ref 7.0–26.0)
CHLORIDE: 96 meq/L — AB (ref 98–109)
CO2: 29 meq/L (ref 22–29)
CREATININE: 1.8 mg/dL — AB (ref 0.7–1.3)
Calcium: 9.1 mg/dL (ref 8.4–10.4)
EGFR: 42 mL/min/{1.73_m2} — ABNORMAL LOW (ref >90)
Glucose: 111 mg/dl (ref 70–140)
POTASSIUM: 4.5 meq/L (ref 3.5–5.1)
Sodium: 135 mEq/L — ABNORMAL LOW (ref 136–145)
Total Bilirubin: 1.01 mg/dL (ref 0.20–1.20)
Total Protein: 7.3 g/dL (ref 6.4–8.3)

## 2016-02-01 LAB — CBC WITH DIFFERENTIAL/PLATELET
BASO%: 0.4 % (ref 0.0–2.0)
BASOS ABS: 0 10*3/uL (ref 0.0–0.1)
EOS ABS: 0 10*3/uL (ref 0.0–0.5)
EOS%: 0.1 % (ref 0.0–7.0)
HEMATOCRIT: 47.7 % (ref 38.4–49.9)
HGB: 15.7 g/dL (ref 13.0–17.1)
LYMPH#: 0.6 10*3/uL — AB (ref 0.9–3.3)
LYMPH%: 6.3 % — AB (ref 14.0–49.0)
MCH: 27.9 pg (ref 27.2–33.4)
MCHC: 32.9 g/dL (ref 32.0–36.0)
MCV: 84.8 fL (ref 79.3–98.0)
MONO#: 0.7 10*3/uL (ref 0.1–0.9)
MONO%: 7.5 % (ref 0.0–14.0)
NEUT#: 7.7 10*3/uL — ABNORMAL HIGH (ref 1.5–6.5)
NEUT%: 85.7 % — AB (ref 39.0–75.0)
Platelets: 171 10*3/uL (ref 140–400)
RBC: 5.63 10*6/uL (ref 4.20–5.82)
RDW: 13.4 % (ref 11.0–14.6)
WBC: 9 10*3/uL (ref 4.0–10.3)

## 2016-02-01 LAB — MAGNESIUM: Magnesium: 2.1 mg/dl (ref 1.5–2.5)

## 2016-02-01 NOTE — Progress Notes (Signed)
Managing Acute Radiation Side Effects for Head and Neck Cancer  Skin irritation:  . Biafine  Topical Emulsion: First-line topical cream to help soothe skin irritation.  Apply to skin in radiation fields at least 4 hours before radiotherapy, or any time after treatments during the rest of the day.  . Triple Antibiotic Ointment (Neosporin): Apply to areas of skin with moist breakdown to prevent infection.  . 1% hydrocortisone cream: Apply to areas of skin that are itching, up to three times a day.  Arnetha Massy (Silver Sulfadiazine): Used in select cases if large patches of skin develop moist breakdown (let physician or nurse know if you have a "sulfa" drug allergy)  Soreness in mouth or throat: . Baking Soda Rinse: a home remedy to soothe/cleanse mouth and loosen thick saliva.  Mix 1/2 teaspoon salt, 1/2 teaspoon baking soda, 1 pint water.  Swish, gargle and spit as needed to soothe/cleanse mouth. Use as often as you want.  . Sucralfate: coats throat to soothe it before meals or any time of day. Crush 1 tablet in 10 mL H20 and swallow up to four times a day.  . 2% viscous Lidocaine: Soothes mouth and/or throat by numbing your mucous membranes. Mix 1 part 2% viscous lidocaine, 1 part H20. Swish and/or swallow 29mL of this mixture, 45min before meals and at bedtime, up to four times a day. Alternate with Sucralfate.  . Narcotics: Various short acting and long acting narcotics can be prescribed.  Often, medical oncology will prescribe these if you are receiving chemotherapy concurrently. Narcotics may cause constipation. It may be helpful to take a stool softener (Docusate Sodium) or gentle laxative (ie Senna or Polyethylene Glycol) to prevent constipation.  Having food in your stomach before ingesting a narcotic may reduce risk of stomach upset.  Thick Saliva: . Baking Soda Rinse: a home remedy to soothe/cleanse mouth and loosen thick saliva.  Mix 1/2 teaspoon salt, 1/2 teaspoon baking soda, 1  pint water.  Swish, gargle, and spit as needed to soothe/cleanse mouth. Use as often as you want.  . Some patients find Diet Ginger Ale or Papaya Juice to be helpful.  . In extreme cases, your physician may consider prescribing a Scopolamine transdermal patch which dries up your saliva.    Poor taste, or lack of taste:   . There are no well-established medications to combat taste bud changes from radiotherapy.  It often takes weeks to months to regain taste function.  Eating bland foods and drinking nutritional shakes  may help you maintain your weight when food is not enjoyable.  Some patients supplement their oral intake with a feeding tube.  Fatigue and weakness: . There is not a well-established safe and effective medication to combat radiation-induced fatigue.  However, if you are able to perform light exercise (such as a daily walk, yoga, recumbent stationary bicycling), this may combat fatigue and help you maintain muscle mass during treatment.  . Maintaining hydration and nutrition are also important.  If you have not been referred to a nutritionist and would like a referral, please let your nurse or physician know.  . Try to get at least 8 hours of sleep each night. You may need a daily nap, but try not to nap so late that it interferes with your nightly sleep schedule.     Pt here for patient teaching.  Pt given Radiation and You booklet, Managing Acute Radiation Side Effects for Head and Neck Cancer handout, skin care instructions and Sonafine. Pt reports  they have not watched the Radiation Therapy Education video, but were given the link to watch at home.   Reviewed areas of pertinence such as fatigue, skin changes, throat changes, urinary and bladder changes, headache, blurry vision, breast tenderness, breast swelling, cough, shortness of breath, earaches and taste changes . Pt able to give teach back of to pat skin, use unscented/gentle soap and drink plenty of water,apply Sonafine  bid, avoid applying anything to skin within 4 hours of treatment and to use an electric razor if they must shave. Pt verbalizes understanding of information given and will contact nursing with any questions or concerns.     [email or web address]/whattoexpect/index

## 2016-02-01 NOTE — Assessment & Plan Note (Signed)
Unfortunately, he developed significant side effects from recent treatment. More worrisome, he was noncompliant and declined to come in for evaluation and hydration therapy. As I suspected, the patient developed acute renal failure. We discussed the risk and benefits of chemotherapy and ultimately we decided to stop chemotherapy.  I will continue to provide supportive care and recommend recheck blood work at the end of the week to ensure that his kidney function improved back to baseline if possible

## 2016-02-01 NOTE — Assessment & Plan Note (Signed)
The patient developed acute renal failure related to recent episode of nausea, vomiting, dehydration and diarrhea last week. Most of his symptoms have recovered and the patient is confident he could adequately hydrate himself over the next few days.  I recommend recheck blood work at the end of the week to be sure that the creatinine continues to trend down. If repeat creatinine is high, we might have to schedule him for IV fluid hydration therapy

## 2016-02-01 NOTE — Telephone Encounter (Signed)
per pof please sch pt back tp lab

## 2016-02-01 NOTE — Progress Notes (Signed)
[place] OFFICE PROGRESS NOTE  Patient Care Team: Sharilyn Sites, MD as PCP - General (Family Medicine) Leta Baptist, MD as Consulting Physician (Otolaryngology) Heath Lark, MD as Consulting Physician (Hematology and Oncology) Leota Sauers, RN as Oncology Nurse Navigator Eppie Gibson, MD as Attending Physician (Radiation Oncology)  SUMMARY OF ONCOLOGIC HISTORY:   Cancer of base of tongue ([place])   12/02/2015 Imaging Ct neck showed right base of tongue cancer and large right neck cervical lymphadenopathy   12/10/2015 Pathology Results Accession: [id number] right tongue base and right neck mass biopsy were positive for invasive squamous cell cancer, HPV positive   12/10/2015 Procedure He underwent laryngoscopy and biopsy of base of tongue and excision of right neck mass   01/11/2016 Imaging PET scan: Hypermetabolic right tongue lesion without evidence of metastatic disease. A necrotic appearing right level-II lymph node, seen on 12/02/2015, is no longer visualized. 2. 1.8 cm low-attenuation lesion in the left lobe of the thyroid, nonspecific   01/22/2016 Surgery PEG and port-a-cath placed.   01/27/2016 - 01/27/2016 Chemotherapy He received only 1 dose of cisplatin, complicated by uncontrolled nausea, vomiting, diarrhea and tinnitus. After much discussion, decision was made to discontinue chemotherapy permanently   01/27/2016 -  Radiation Therapy     INTERVAL HISTORY: Please see below for problem oriented charting. He is seen for further follow-up. Last week, on 01/29/2016, he developed acute onset of severe uncontrolled nausea, vomiting, diarrhea and weakness. The patient did not show up for treatment and despite multiple phone calls for him to come in, he has declined to be evaluated. Subsequently, his symptoms resolved with anti-emetics. Over the weekend, his appetite has return back to normal and he had no further nausea, vomiting or diarrhea. He complained of new onset of tinnitus  after treatment. He denies mucositis, fevers or chills.  REVIEW OF SYSTEMS:   Constitutional: Denies fevers, chills or abnormal weight loss Eyes: Denies blurriness of vision Ears, nose, mouth, throat, and face: Denies mucositis or sore throat Respiratory: Denies cough, dyspnea or wheezes Cardiovascular: Denies palpitation, chest discomfort or lower extremity swelling Skin: Denies abnormal skin rashes Lymphatics: Denies new lymphadenopathy or easy bruising Neurological:Denies numbness, tingling or new weaknesses Behavioral/Psych: Mood is stable, no new changes  All other systems were reviewed with the patient and are negative.  I have reviewed the past medical history, past surgical history, social history and family history with the patient and they are unchanged from previous note.  ALLERGIES:  is allergic to tetracyclines & related.  MEDICATIONS:  Current Outpatient Prescriptions  Medication Sig Dispense Refill  . dexamethasone (DECADRON) 4 MG tablet Take 2 tablets by mouth once a day on the day after chemotherapy daily for 2 days. Take with food. 30 tablet 1  . diazepam (VALIUM) 10 MG tablet Take 10 mg by mouth every 6 (six) hours as needed for anxiety.     Marland Kitchen HYDROcodone-acetaminophen (NORCO/VICODIN) 5-325 MG tablet Take 1-2 tablets by mouth every 6 (six) hours as needed for moderate pain. Reported on 12/31/2015    . lidocaine-prilocaine (EMLA) cream Apply to affected area once 30 g 3  . ondansetron (ZOFRAN) 8 MG tablet Take 1 tablet (8 mg total) by mouth every 8 (eight) hours as needed. Start on the third day after chemotherapy. 30 tablet 1  . pravastatin (PRAVACHOL) 20 MG tablet Take 20 mg by mouth daily.    . prochlorperazine (COMPAZINE) 10 MG tablet Take 1 tablet (10 mg total) by mouth every 6 (six) hours as  needed (Nausea or vomiting). (Patient not taking: Reported on 02/01/2016) 30 tablet 1  . sodium fluoride (FLUORISHIELD) 1.1 % GEL dental gel Instill one drop of gel per tooth  space of tray. Place over teeth for 5 minutes. Remove. Spit out excess. Repeat nightly. 120 mL prn   No current facility-administered medications for this visit.    PHYSICAL EXAMINATION: ECOG PERFORMANCE STATUS: 1 - Symptomatic but completely ambulatory  Filed Vitals:   02/01/16 0954 02/01/16 1013  BP: 136/91 136/91  Pulse: 85 85  Temp: 98.1 F (36.7 C)   Resp: 18 18   Filed Weights   02/01/16 0954 02/01/16 1013  Weight: 257 lb 14.4 oz (116.983 kg) 257 lb 14.4 oz (116.983 kg)    GENERAL:alert, no distress and comfortable SKIN: skin color, texture, turgor are normal, no rashes or significant lesions EYES: normal, Conjunctiva are pink and non-injected, sclera clear OROPHARYNX:no exudate, no erythema and lips, buccal mucosa, and tongue normal  NECK: supple, thyroid normal size, non-tender, without nodularity LYMPH:  no palpable lymphadenopathy in the cervical, axillary or inguinal LUNGS: clear to auscultation and percussion with normal breathing effort HEART: regular rate & rhythm and no murmurs and no lower extremity edema ABDOMEN:abdomen soft, non-tender and normal bowel sounds. Feeding tube site looks okay Musculoskeletal:no cyanosis of digits and no clubbing  NEURO: alert & oriented x 3 with fluent speech, no focal motor/sensory deficits  LABORATORY DATA:  I have reviewed the data as listed    Component Value Date/Time   NA 135* 02/01/2016 1036   NA 138 01/22/2016 0800   K 4.5 02/01/2016 1036   K 4.0 01/22/2016 0800   CL 104 01/22/2016 0800   CO2 29 02/01/2016 1036   CO2 25 01/22/2016 0800   GLUCOSE 111 02/01/2016 1036   GLUCOSE 114* 01/22/2016 0800   BUN 30.2* 02/01/2016 1036   BUN 15 01/22/2016 0800   CREATININE 1.8* 02/01/2016 1036   CREATININE 1.01 01/22/2016 0800   CALCIUM 9.1 02/01/2016 1036   CALCIUM 9.1 01/22/2016 0800   PROT 7.3 02/01/2016 1036   PROT 7.4 01/22/2016 0800   ALBUMIN 3.6 02/01/2016 1036   ALBUMIN 3.9 01/22/2016 0800   AST 27 02/01/2016  1036   AST 18 01/22/2016 0800   ALT 54 02/01/2016 1036   ALT 19 01/22/2016 0800   ALKPHOS 87 02/01/2016 1036   ALKPHOS 89 01/22/2016 0800   BILITOT 1.01 02/01/2016 1036   BILITOT 1.0 01/22/2016 0800   GFRNONAA >60 01/22/2016 0800   GFRAA >60 01/22/2016 0800    No results found for: SPEP, UPEP  Lab Results  Component Value Date   WBC 9.0 02/01/2016   NEUTROABS 7.7* 02/01/2016   HGB 15.7 02/01/2016   HCT 47.7 02/01/2016   MCV 84.8 02/01/2016   PLT 171 02/01/2016      Chemistry      Component Value Date/Time   NA 135* 02/01/2016 1036   NA 138 01/22/2016 0800   K 4.5 02/01/2016 1036   K 4.0 01/22/2016 0800   CL 104 01/22/2016 0800   CO2 29 02/01/2016 1036   CO2 25 01/22/2016 0800   BUN 30.2* 02/01/2016 1036   BUN 15 01/22/2016 0800   CREATININE 1.8* 02/01/2016 1036   CREATININE 1.01 01/22/2016 0800      Component Value Date/Time   CALCIUM 9.1 02/01/2016 1036   CALCIUM 9.1 01/22/2016 0800   ALKPHOS 87 02/01/2016 1036   ALKPHOS 89 01/22/2016 0800   AST 27 02/01/2016 1036   AST 18  01/22/2016 0800   ALT 54 02/01/2016 1036   ALT 19 01/22/2016 0800   BILITOT 1.01 02/01/2016 1036   BILITOT 1.0 01/22/2016 0800      ASSESSMENT & PLAN:   Prerenal acute renal failure (HCC)  The patient developed acute renal failure related to recent episode of nausea, vomiting, dehydration and diarrhea last week. Most of his symptoms have recovered and the patient is confident he could adequately hydrate himself over the next few days.  I recommend recheck blood work at the end of the week to be sure that the creatinine continues to trend down. If repeat creatinine is high, we might have to schedule him for IV fluid hydration therapy  Tinnitus of both ears  He had new onset of tinnitus on both ears which is likely the side effects of cisplatin.  There is no antedote or treatment for this. As above, we have made an informed decision to discontinue chemotherapy. Hopefully, in time, his  symptoms will improve.  Cancer of base of tongue ([place]) Unfortunately, he developed significant side effects from recent treatment. More worrisome, he was noncompliant and declined to come in for evaluation and hydration therapy. As I suspected, the patient developed acute renal failure. We discussed the risk and benefits of chemotherapy and ultimately we decided to stop chemotherapy.  I will continue to provide supportive care and recommend recheck blood work at the end of the week to ensure that his kidney function improved back to baseline if possible     All questions were answered. The patient knows to call the clinic with any problems, questions or concerns. No barriers to learning was detected. I spent 25 minutes counseling the patient face to face. The total time spent in the appointment was 30 minutes and more than 50% was on counseling and review of test results     [facility], [place], MD 02/01/2016 11:36 AM

## 2016-02-01 NOTE — Telephone Encounter (Signed)
Labs added for 02/09 per 02/06 POF. Pt is aware

## 2016-02-01 NOTE — Progress Notes (Signed)
Mr. Ramires is here for his 4th fraction of radiation to his Bilateral Neck. He has not been feeling well and reports he was very sick with diarrhea and cold chills on Friday after drinking a boost. However he had not been feeling well since his first Chemotherapy last Monday. He will receive 2 more treatments of chemotherapy. He is feeling better today. He reports he is eating well since Friday. He denies pain this morning or nausea. He is using the sonafine cream twice daily to his neck, which is normal in appearance.   BP 144/96 mmHg  Pulse 79  Temp(Src) 98.4 F (36.9 C)  Ht 6\' 3"  (1.905 m)  Wt 258 lb 3.2 oz (117.119 kg)  BMI 32.27 kg/m2   Wt Readings from Last 3 Encounters:  02/01/16 258 lb 3.2 oz (117.119 kg)  01/25/16 264 lb 9.6 oz (120.022 kg)  01/22/16 275 lb 6.4 oz (124.921 kg)

## 2016-02-01 NOTE — Assessment & Plan Note (Signed)
He had new onset of tinnitus on both ears which is likely the side effects of cisplatin.  There is no antedote or treatment for this. As above, we have made an informed decision to discontinue chemotherapy. Hopefully, in time, his symptoms will improve.

## 2016-02-01 NOTE — Progress Notes (Signed)
   Weekly Management Note:  Outpatient    ICD-9-CM ICD-10-CM   1. Cancer of base of tongue (HCC) 141.0 C01     Current Dose:  8 Gy  Projected Dose: 70 Gy   Narrative:  The patient presents for routine under treatment assessment.  CBCT/MVCT images/Port film x-rays were reviewed.  The chart was checked.  Mr. Swarm is here for his 4th fraction of radiation to his Bilateral Neck. He has not been feeling well and reports he was very sick with diarrhea and cold chills on Friday after drinking a boost. However he had not been feeling well since his first Chemotherapy last Monday.  I spoke with Dr Alvy Bimler who will probably hold his chemotherapy in the future. He is feeling better today. He reports he is eating well since Friday. He denies pain this morning or nausea. He is using the sonafine cream twice daily to his neck, which is normal in appearance.   Physical Findings:  Wt Readings from Last 3 Encounters:  02/01/16 257 lb 14.4 oz (116.983 kg)  02/01/16 258 lb 3.2 oz (117.119 kg)  01/25/16 264 lb 9.6 oz (120.022 kg)   Filed Vitals:   02/01/16 0915  BP: 144/96  Pulse: 79  Temp: 98.4 F (36.9 C)    No oral thrush or mucosal tumor visible.  Skin without irritation.  Scar on neck without drainage  CBC    Component Value Date/Time   WBC 9.9 01/22/2016 0800   RBC 5.20 01/22/2016 0800   HGB 15.2 01/22/2016 0800   HCT 46.5 01/22/2016 0800   PLT 226 01/22/2016 0800   MCV 89.4 01/22/2016 0800   MCH 29.2 01/22/2016 0800   MCHC 32.7 01/22/2016 0800   RDW 13.6 01/22/2016 0800   LYMPHSABS 1.2 01/22/2016 0800   MONOABS 0.8 01/22/2016 0800   EOSABS 0.2 01/22/2016 0800   BASOSABS 0.1 01/22/2016 0800     CMP     Component Value Date/Time   NA 138 01/22/2016 0800   K 4.0 01/22/2016 0800   CL 104 01/22/2016 0800   CO2 25 01/22/2016 0800   GLUCOSE 114* 01/22/2016 0800   BUN 15 01/22/2016 0800   CREATININE 1.01 01/22/2016 0800   CALCIUM 9.1 01/22/2016 0800   PROT 7.4 01/22/2016 0800    ALBUMIN 3.9 01/22/2016 0800   AST 18 01/22/2016 0800   ALT 19 01/22/2016 0800   ALKPHOS 89 01/22/2016 0800   BILITOT 1.0 01/22/2016 0800   GFRNONAA >60 01/22/2016 0800   GFRAA >60 01/22/2016 0800     Impression:  The patient is tolerating radiotherapy.   Plan:  Continue radiotherapy as planned. Urged to come in [place] weekday, even if he feels poorly.  I spoke with  Dr. Alvy Bimler who will see him this AM, and she will order labs. -----------------------------------  Eppie Gibson, MD

## 2016-02-02 ENCOUNTER — Encounter: Payer: Self-pay | Admitting: *Deleted

## 2016-02-02 ENCOUNTER — Ambulatory Visit
Admission: RE | Admit: 2016-02-02 | Discharge: 2016-02-02 | Disposition: A | Discharge status: Home / Self Care | Payer: Commercial Managed Care - HMO | Source: Ambulatory Visit | Attending: Radiation Oncology | Admitting: Radiation Oncology

## 2016-02-02 DIAGNOSIS — Z510 Encounter for antineoplastic radiation therapy: Secondary | ICD-10-CM | POA: Diagnosis not present

## 2016-02-02 NOTE — Progress Notes (Signed)
  Oncology Nurse Navigator Documentation  Navigator Location: [facility] (02/02/16 0905) Navigator Encounter Type: Treatment (02/02/16 0905)               Barriers/Navigation Needs: Education (02/02/16 0905)         Education Method: Verbal (02/02/16 3500)        Met with Mr Fricker following his [place] tmt.   He reported:  Feeling much better, denied any N&V.  Drinking 6-8 16 oz bottles of water daily.  Applying Sonafine daily.  I encouraged BID application, explained benefit.  Denied any further concerns about PEG tube "getting longer". He verbalized understanding he will no longer receive concurrent chemotherapy. He understands he can contact me with needs/concerns.  [name], RN, BSN, [place] at [place] [phone]             Time Spent with Patient: 15 (02/02/16 0905)

## 2016-02-03 ENCOUNTER — Other Ambulatory Visit (HOSPITAL_COMMUNITY): Payer: Self-pay | Admitting: Diagnostic Radiology

## 2016-02-03 ENCOUNTER — Ambulatory Visit
Admission: RE | Admit: 2016-02-03 | Discharge: 2016-02-03 | Disposition: A | Discharge status: Home / Self Care | Payer: Commercial Managed Care - HMO | Source: Ambulatory Visit | Attending: Radiation Oncology | Admitting: Radiation Oncology

## 2016-02-03 ENCOUNTER — Ambulatory Visit: Payer: Commercial Managed Care - HMO | Admitting: Nutrition

## 2016-02-03 DIAGNOSIS — Z510 Encounter for antineoplastic radiation therapy: Secondary | ICD-10-CM | POA: Diagnosis not present

## 2016-02-03 DIAGNOSIS — C029 Malignant neoplasm of tongue, unspecified: Secondary | ICD-10-CM

## 2016-02-03 NOTE — Progress Notes (Signed)
Nutrition follow-up completed with patient after radiation therapy for tongue cancer. Patient states he was very ill after first treatment of chemotherapy. He reports physician is discontinuing chemotherapy but he will continue with radiation treatments. Reports weight loss occurred when he was very sick last week. Patient reports he is eating well now and denies difficulty chewing or swallowing. Nausea and vomiting have resolved. 2-3 stools daily.  Nutrition diagnosis:  Predicted suboptimal energy intake has evolved into inadequate oral intake secondary to tongue cancer and associated treatments as evidenced by 18.8 pound weight loss.  Intervention:  I educated patient to consume high-calorie high-protein foods frequently throughout the day. Educated patient to begin one can Ensure Plus via feeding tube with 60 cc free water before and after daily. Provided samples, and contact information. Questions were answered.  Teach back method was used.  Monitoring, evaluation, goals: Patient will tolerate increased calories and protein to promote weight maintenance.  Next visit: Wednesday, February 15 after radiation treatment.  **Disclaimer: This note was dictated with voice recognition software. Similar sounding words can inadvertently be transcribed and this note may contain transcription errors which may not have been corrected upon publication of note.**

## 2016-02-04 ENCOUNTER — Other Ambulatory Visit (HOSPITAL_BASED_OUTPATIENT_CLINIC_OR_DEPARTMENT_OTHER): Payer: Commercial Managed Care - HMO

## 2016-02-04 ENCOUNTER — Telehealth: Payer: Self-pay | Admitting: Hematology and Oncology

## 2016-02-04 ENCOUNTER — Ambulatory Visit
Admission: RE | Admit: 2016-02-04 | Discharge: 2016-02-04 | Disposition: A | Discharge status: Home / Self Care | Payer: Commercial Managed Care - HMO | Source: Ambulatory Visit | Attending: Radiation Oncology | Admitting: Radiation Oncology

## 2016-02-04 ENCOUNTER — Other Ambulatory Visit: Payer: Self-pay | Admitting: Hematology and Oncology

## 2016-02-04 DIAGNOSIS — C01 Malignant neoplasm of base of tongue: Secondary | ICD-10-CM | POA: Diagnosis not present

## 2016-02-04 DIAGNOSIS — Z510 Encounter for antineoplastic radiation therapy: Secondary | ICD-10-CM | POA: Diagnosis not present

## 2016-02-04 LAB — COMPREHENSIVE METABOLIC PANEL
ALT: 44 U/L (ref 0–55)
AST: 22 U/L (ref 5–34)
Albumin: 3.4 g/dL — ABNORMAL LOW (ref 3.5–5.0)
Alkaline Phosphatase: 77 U/L (ref 40–150)
Anion Gap: 10 mEq/L (ref 3–11)
BILIRUBIN TOTAL: 0.57 mg/dL (ref 0.20–1.20)
BUN: 21.1 mg/dL (ref 7.0–26.0)
CHLORIDE: 101 meq/L (ref 98–109)
CO2: 26 meq/L (ref 22–29)
CREATININE: 1.4 mg/dL — AB (ref 0.7–1.3)
Calcium: 9.2 mg/dL (ref 8.4–10.4)
EGFR: 56 mL/min/{1.73_m2} — AB (ref >90)
GLUCOSE: 106 mg/dL (ref 70–140)
Potassium: 4 mEq/L (ref 3.5–5.1)
SODIUM: 137 meq/L (ref 136–145)
TOTAL PROTEIN: 6.9 g/dL (ref 6.4–8.3)

## 2016-02-04 LAB — CBC WITH DIFFERENTIAL/PLATELET
BASO%: 0.5 % (ref 0.0–2.0)
Basophils Absolute: 0 10*3/uL (ref 0.0–0.1)
EOS ABS: 0 10*3/uL (ref 0.0–0.5)
EOS%: 0.2 % (ref 0.0–7.0)
HCT: 42.7 % (ref 38.4–49.9)
HGB: 14.1 g/dL (ref 13.0–17.1)
LYMPH%: 8.3 % — AB (ref 14.0–49.0)
MCH: 28.5 pg (ref 27.2–33.4)
MCHC: 33 g/dL (ref 32.0–36.0)
MCV: 86.3 fL (ref 79.3–98.0)
MONO#: 0.4 10*3/uL (ref 0.1–0.9)
MONO%: 7.5 % (ref 0.0–14.0)
NEUT%: 83.5 % — ABNORMAL HIGH (ref 39.0–75.0)
NEUTROS ABS: 4.9 10*3/uL (ref 1.5–6.5)
Platelets: 136 10*3/uL — ABNORMAL LOW (ref 140–400)
RBC: 4.95 10*6/uL (ref 4.20–5.82)
RDW: 12.8 % (ref 11.0–14.6)
WBC: 5.9 10*3/uL (ref 4.0–10.3)
lymph#: 0.5 10*3/uL — ABNORMAL LOW (ref 0.9–3.3)

## 2016-02-04 LAB — MAGNESIUM: MAGNESIUM: 2.1 mg/dL (ref 1.5–2.5)

## 2016-02-04 NOTE — Telephone Encounter (Signed)
I reviewed test results with the patient's. Serum creatinine is improving. I recommend 1 more blood drawn next week.

## 2016-02-04 NOTE — Telephone Encounter (Signed)
Spoke with patient to confirm lab appointment for 2/15 after nutrition appt per 2/9 pof

## 2016-02-05 ENCOUNTER — Ambulatory Visit
Admission: RE | Admit: 2016-02-05 | Discharge: 2016-02-05 | Disposition: A | Discharge status: Home / Self Care | Payer: Commercial Managed Care - HMO | Source: Ambulatory Visit | Attending: Radiation Oncology | Admitting: Radiation Oncology

## 2016-02-05 ENCOUNTER — Ambulatory Visit (HOSPITAL_COMMUNITY)
Admission: RE | Admit: 2016-02-05 | Discharge: 2016-02-05 | Disposition: A | Discharge status: Home / Self Care | Payer: Commercial Managed Care - HMO | Source: Ambulatory Visit | Attending: Diagnostic Radiology | Admitting: Diagnostic Radiology

## 2016-02-05 DIAGNOSIS — Z510 Encounter for antineoplastic radiation therapy: Secondary | ICD-10-CM | POA: Diagnosis not present

## 2016-02-05 DIAGNOSIS — C029 Malignant neoplasm of tongue, unspecified: Secondary | ICD-10-CM

## 2016-02-05 NOTE — Procedures (Signed)
Pigtail gastrostomy tube placed 1/27. T-Tack fasteners removed today without difficulty. No further follow up  Ascencion Dike PA-C Interventional Radiology 02/05/2016 9:33 AM

## 2016-02-08 ENCOUNTER — Encounter: Payer: Self-pay | Admitting: Radiation Oncology

## 2016-02-08 ENCOUNTER — Encounter (HOSPITAL_COMMUNITY): Payer: Self-pay | Admitting: Dentistry

## 2016-02-08 ENCOUNTER — Ambulatory Visit
Admission: RE | Admit: 2016-02-08 | Discharge: 2016-02-08 | Disposition: A | Discharge status: Home / Self Care | Payer: Commercial Managed Care - HMO | Source: Ambulatory Visit | Attending: Radiation Oncology | Admitting: Radiation Oncology

## 2016-02-08 ENCOUNTER — Ambulatory Visit (HOSPITAL_COMMUNITY): Payer: Self-pay | Admitting: Dentistry

## 2016-02-08 VITALS — BP 143/87 | HR 70 | Temp 97.9°F | Wt 258.0 lb

## 2016-02-08 VITALS — BP 156/105 | HR 74 | Temp 98.2°F | Resp 20 | Wt 258.0 lb

## 2016-02-08 DIAGNOSIS — C01 Malignant neoplasm of base of tongue: Secondary | ICD-10-CM

## 2016-02-08 DIAGNOSIS — Z0189 Encounter for other specified special examinations: Secondary | ICD-10-CM

## 2016-02-08 DIAGNOSIS — K117 Disturbances of salivary secretion: Secondary | ICD-10-CM

## 2016-02-08 DIAGNOSIS — R634 Abnormal weight loss: Secondary | ICD-10-CM

## 2016-02-08 DIAGNOSIS — Z510 Encounter for antineoplastic radiation therapy: Secondary | ICD-10-CM | POA: Diagnosis not present

## 2016-02-08 DIAGNOSIS — R432 Parageusia: Secondary | ICD-10-CM

## 2016-02-08 DIAGNOSIS — R1310 Dysphagia, unspecified: Secondary | ICD-10-CM

## 2016-02-08 DIAGNOSIS — R682 Dry mouth, unspecified: Secondary | ICD-10-CM

## 2016-02-08 DIAGNOSIS — R252 Cramp and spasm: Secondary | ICD-10-CM

## 2016-02-08 DIAGNOSIS — Z01818 Encounter for other preprocedural examination: Secondary | ICD-10-CM | POA: Diagnosis not present

## 2016-02-08 DIAGNOSIS — K1233 Oral mucositis (ulcerative) due to radiation: Secondary | ICD-10-CM

## 2016-02-08 MED ORDER — SUCRALFATE 1 G PO TABS: ORAL_TABLET | ORAL | Status: DC

## 2016-02-08 MED ORDER — LIDOCAINE VISCOUS 2 % MT SOLN: OROMUCOSAL | Status: DC

## 2016-02-08 MED ORDER — SODIUM FLUORIDE 1.1 % DT GEL: DENTAL | Status: DC

## 2016-02-08 NOTE — Patient Instructions (Signed)
RECOMMENDATIONS: 1. Brush after meals and at bedtime.  Use fluoride at bedtime. 2. Use trismus exercises as directed. 3. Use Biotene Rinse or salt water/baking soda rinses. 4. Multiple sips of water as needed. 5. Return to clinic in two months for oral exam after radiation therapy. Call if problems before then.  Katlyn Muldrew F. Amauri Keefe, DDS   

## 2016-02-08 NOTE — Progress Notes (Signed)
02/08/2016  Patient Name:   [name] Best Date of Birth:   [birth year] Medical Record Number: [id number]  BP 143/87 mmHg  Pulse 70  Temp(Src) 97.9 F (36.6 C) (Oral)  Wt 258 lb (117.028 kg)  [name] presents for oral examination during chemoradiation therapy. Patient has completed 9/35 radiation treatments. Patient has completed 1 chemotherapy cycle with no additional chemotherapy secondary to initial chemotherapy side effects.  REVIEW OF CHIEF COMPLAINTS:  DRY MOUTH: Yes HARD TO SWALLOW: Yes, at times.  HURT TO SWALLOW: Yes, at times. TASTE CHANGES: Minimal taste remains. SORES IN MOUTH: No TRISMUS: Some tenderness on opening on right side. Decreased maximum interincisal opening now measured at 28 mm WEIGHT: 258 pounds with loss of 19 pounds at start of treatment  HOME OH REGIMEN:  BRUSHING: Twice a day FLOSSING: Once a day RINSING: Rinsing with salt water and baking soda rinses and Biotene rinses. FLUORIDE: Using fluoride at bedtime TRISMUS EXERCISES:  Maximum interincisal opening: 28 mm.   DENTAL EXAM:  Oral Hygiene:(PLAQUE): Good oral hygiene. Brushing and lingual aspects of medial anterior teeth was encouraged. LOCATION OF MUCOSITIS: Back of throat. DESCRIPTION OF SALIVA: Xerostomia with foamy saliva. ANY EXPOSED BONE: None noted OTHER WATCHED AREAS: Extraction site #31 DX: Xerostomia, Dysgeusia, Dysphagia, Odynophagia, Weight Loss, Trimus and Mucositis  RECOMMENDATIONS: 1. Brush after meals and at bedtime.  Use fluoride at bedtime. 2. Use trismus exercises as directed. 3. Use Biotene Rinse or salt water/baking soda rinses. 4. Multiple sips of water as needed. 5. Return to clinic in two months for oral exam after radiation therapy. Call if problems before then.  Lenn Cal, DDS

## 2016-02-08 NOTE — Progress Notes (Signed)
    Weekly Management Note:  Outpatient    ICD-9-CM ICD-10-CM   1. Cancer of base of tongue (HCC) 141.0 C01 sodium fluoride (FLUORISHIELD) 1.1 % GEL dental gel    Current Dose:  16 Gy  Projected Dose: 70 Gy   Narrative:  The patient presents for routine under treatment assessment.  CBCT/MVCT images/Port film x-rays were reviewed.  The chart was checked.   He Has mild throat pain, eating well, using baking soda gargle, takes vicodin for baseline joint pain from PCP.  Physical Findings:  Wt Readings from Last 3 Encounters:  02/08/16 258 lb (117.028 kg)  02/08/16 258 lb (117.028 kg)  02/03/16 256 lb 9.6 oz (116.393 kg)   Filed Vitals:   02/08/16 0845  BP: 156/105  Pulse: 74  Temp: 98.2 F (36.8 C)  Resp: 20    No oral thrush or mucosal tumor visible.  Mucosal early erythema. Skin slightly pink.  Scar on neck without drainage  CBC    Component Value Date/Time   WBC 5.9 02/04/2016 0841   WBC 9.9 01/22/2016 0800   RBC 4.95 02/04/2016 0841   RBC 5.20 01/22/2016 0800   HGB 14.1 02/04/2016 0841   HGB 15.2 01/22/2016 0800   HCT 42.7 02/04/2016 0841   HCT 46.5 01/22/2016 0800   PLT 136* 02/04/2016 0841   PLT 226 01/22/2016 0800   MCV 86.3 02/04/2016 0841   MCV 89.4 01/22/2016 0800   MCH 28.5 02/04/2016 0841   MCH 29.2 01/22/2016 0800   MCHC 33.0 02/04/2016 0841   MCHC 32.7 01/22/2016 0800   RDW 12.8 02/04/2016 0841   RDW 13.6 01/22/2016 0800   LYMPHSABS 0.5* 02/04/2016 0841   LYMPHSABS 1.2 01/22/2016 0800   MONOABS 0.4 02/04/2016 0841   MONOABS 0.8 01/22/2016 0800   EOSABS 0.0 02/04/2016 0841   EOSABS 0.2 01/22/2016 0800   BASOSABS 0.0 02/04/2016 0841   BASOSABS 0.1 01/22/2016 0800     CMP     Component Value Date/Time   NA 137 02/04/2016 0841   NA 138 01/22/2016 0800   K 4.0 02/04/2016 0841   K 4.0 01/22/2016 0800   CL 104 01/22/2016 0800   CO2 26 02/04/2016 0841   CO2 25 01/22/2016 0800   GLUCOSE 106 02/04/2016 0841   GLUCOSE 114* 01/22/2016 0800   BUN 21.1 02/04/2016 0841   BUN 15 01/22/2016 0800   CREATININE 1.4* 02/04/2016 0841   CREATININE 1.01 01/22/2016 0800   CALCIUM 9.2 02/04/2016 0841   CALCIUM 9.1 01/22/2016 0800   PROT 6.9 02/04/2016 0841   PROT 7.4 01/22/2016 0800   ALBUMIN 3.4* 02/04/2016 0841   ALBUMIN 3.9 01/22/2016 0800   AST 22 02/04/2016 0841   AST 18 01/22/2016 0800   ALT 44 02/04/2016 0841   ALT 19 01/22/2016 0800   ALKPHOS 77 02/04/2016 0841   ALKPHOS 89 01/22/2016 0800   BILITOT 0.57 02/04/2016 0841   BILITOT 1.0 01/22/2016 0800   GFRNONAA >60 01/22/2016 0800   GFRAA >60 01/22/2016 0800     Impression:  The patient is tolerating radiotherapy.   Plan:  Continue radiotherapy as planned. Rx'd lidocaine and sucralfate today for throat.Marland Kitchen Refilled fluoride tx's. Chemotherapy has been stopped. -----------------------------------  Eppie Gibson, MD

## 2016-02-08 NOTE — Progress Notes (Signed)
Weekly rad txs not treated yet, has had 8 b/l neck so far, slight erythema on skin ,  Starting to have a sore throat, using baking soda rinses, biotine rinses,  And has dry mouth, no nausea, pain mopstly in his hip 4/10 scale, appetite good, no difficulty swallowing, using sonafine cream daily 8:50 AM BP 156/105 mmHg  Pulse 74  Temp(Src) 98.2 F (36.8 C) (Oral)  Resp 20  Wt 258 lb (117.028 kg)  SpO2 99%  Wt Readings from Last 3 Encounters:  02/08/16 258 lb (117.028 kg)  02/03/16 256 lb 9.6 oz (116.393 kg)  02/01/16 257 lb 14.4 oz (116.983 kg)

## 2016-02-09 ENCOUNTER — Ambulatory Visit
Admission: RE | Admit: 2016-02-09 | Discharge: 2016-02-09 | Disposition: A | Discharge status: Home / Self Care | Payer: Commercial Managed Care - HMO | Source: Ambulatory Visit | Attending: Radiation Oncology | Admitting: Radiation Oncology

## 2016-02-09 ENCOUNTER — Telehealth: Payer: Self-pay | Admitting: Hematology and Oncology

## 2016-02-09 DIAGNOSIS — Z510 Encounter for antineoplastic radiation therapy: Secondary | ICD-10-CM | POA: Diagnosis not present

## 2016-02-09 NOTE — Telephone Encounter (Signed)
Faxed pt medical records to [employer] release ID [id number]

## 2016-02-10 ENCOUNTER — Ambulatory Visit: Payer: Commercial Managed Care - HMO | Admitting: Nutrition

## 2016-02-10 ENCOUNTER — Ambulatory Visit
Admission: RE | Admit: 2016-02-10 | Discharge: 2016-02-10 | Disposition: A | Discharge status: Home / Self Care | Payer: Commercial Managed Care - HMO | Source: Ambulatory Visit | Attending: Radiation Oncology | Admitting: Radiation Oncology

## 2016-02-10 ENCOUNTER — Other Ambulatory Visit: Payer: Self-pay | Admitting: Hematology and Oncology

## 2016-02-10 ENCOUNTER — Telehealth: Payer: Self-pay | Admitting: Hematology and Oncology

## 2016-02-10 ENCOUNTER — Other Ambulatory Visit (HOSPITAL_BASED_OUTPATIENT_CLINIC_OR_DEPARTMENT_OTHER): Payer: Commercial Managed Care - HMO

## 2016-02-10 DIAGNOSIS — N179 Acute kidney failure, unspecified: Secondary | ICD-10-CM

## 2016-02-10 DIAGNOSIS — Z510 Encounter for antineoplastic radiation therapy: Secondary | ICD-10-CM | POA: Diagnosis not present

## 2016-02-10 LAB — COMPREHENSIVE METABOLIC PANEL
ALBUMIN: 3.5 g/dL (ref 3.5–5.0)
ALK PHOS: 84 U/L (ref 40–150)
ALT: 18 U/L (ref 0–55)
ANION GAP: 9 meq/L (ref 3–11)
AST: 12 U/L (ref 5–34)
BILIRUBIN TOTAL: 0.49 mg/dL (ref 0.20–1.20)
BUN: 16.1 mg/dL (ref 7.0–26.0)
CO2: 27 meq/L (ref 22–29)
Calcium: 9.5 mg/dL (ref 8.4–10.4)
Chloride: 103 mEq/L (ref 98–109)
Creatinine: 1.3 mg/dL (ref 0.7–1.3)
EGFR: 62 mL/min/{1.73_m2} — AB (ref >90)
GLUCOSE: 101 mg/dL (ref 70–140)
POTASSIUM: 4.2 meq/L (ref 3.5–5.1)
SODIUM: 138 meq/L (ref 136–145)
TOTAL PROTEIN: 7.2 g/dL (ref 6.4–8.3)

## 2016-02-10 NOTE — Telephone Encounter (Signed)
I reviewed his blood work. Creatinine is back to normal. The left him a voicemail. I will get the nurse Navigator to follow

## 2016-02-10 NOTE — Progress Notes (Signed)
Nutrition follow-up completed with patient after radiation therapy for tongue cancer. Patient reports he had a pretty good week. States he did not eat well on Monday but was eating well again on Tuesday.  He appears to be tolerating a variety of textures. Weight is stable and documented as 258 pounds February 13. Patient denies nausea and vomiting. Reports having 2 stools a day. Still denies difficulty chewing and swallowing. Patient states he infused one can of Ensure Plus through his feeding tube without difficulty  Nutrition diagnosis: Inadequate oral intake improved.  Intervention: Patient educated to use oral nutrition supplements via feeding tube when he is not feeling well enough to eat. Patient has Ensure Plus at home. Enforced the importance of preventative mouth rinses to help with sore mouth and dry mouth. Teach back method was used.  Monitoring, evaluation, goals: Patient has been able to tolerate adequate calories and protein for weight maintenance.  Next visit: Wednesday, February 22 after radiation treatment.  **Disclaimer: This note was dictated with voice recognition software. Similar sounding words can inadvertently be transcribed and this note may contain transcription errors which may not have been corrected upon publication of note.**

## 2016-02-11 ENCOUNTER — Ambulatory Visit
Admission: RE | Admit: 2016-02-11 | Discharge: 2016-02-11 | Disposition: A | Discharge status: Home / Self Care | Payer: Commercial Managed Care - HMO | Source: Ambulatory Visit | Attending: Radiation Oncology | Admitting: Radiation Oncology

## 2016-02-11 DIAGNOSIS — Z510 Encounter for antineoplastic radiation therapy: Secondary | ICD-10-CM | POA: Diagnosis not present

## 2016-02-12 ENCOUNTER — Ambulatory Visit
Admission: RE | Admit: 2016-02-12 | Discharge: 2016-02-12 | Disposition: A | Discharge status: Home / Self Care | Payer: Commercial Managed Care - HMO | Source: Ambulatory Visit | Attending: Radiation Oncology | Admitting: Radiation Oncology

## 2016-02-12 DIAGNOSIS — Z510 Encounter for antineoplastic radiation therapy: Secondary | ICD-10-CM | POA: Diagnosis not present

## 2016-02-15 ENCOUNTER — Ambulatory Visit
Admission: RE | Admit: 2016-02-15 | Discharge: 2016-02-15 | Disposition: A | Discharge status: Home / Self Care | Payer: Commercial Managed Care - HMO | Source: Ambulatory Visit | Attending: Radiation Oncology | Admitting: Radiation Oncology

## 2016-02-15 ENCOUNTER — Ambulatory Visit: Payer: Self-pay

## 2016-02-15 ENCOUNTER — Encounter: Payer: Self-pay | Admitting: Nutrition

## 2016-02-15 DIAGNOSIS — Z510 Encounter for antineoplastic radiation therapy: Secondary | ICD-10-CM | POA: Diagnosis not present

## 2016-02-16 ENCOUNTER — Ambulatory Visit
Admission: RE | Admit: 2016-02-16 | Discharge: 2016-02-16 | Disposition: A | Discharge status: Home / Self Care | Payer: Commercial Managed Care - HMO | Source: Ambulatory Visit | Attending: Radiation Oncology | Admitting: Radiation Oncology

## 2016-02-16 ENCOUNTER — Encounter: Payer: Self-pay | Admitting: Radiation Oncology

## 2016-02-16 VITALS — BP 164/96 | HR 76 | Temp 98.2°F | Ht 75.0 in | Wt 252.2 lb

## 2016-02-16 DIAGNOSIS — Z510 Encounter for antineoplastic radiation therapy: Secondary | ICD-10-CM | POA: Diagnosis not present

## 2016-02-16 DIAGNOSIS — C01 Malignant neoplasm of base of tongue: Secondary | ICD-10-CM

## 2016-02-16 MED ORDER — HYDROCODONE-ACETAMINOPHEN 7.5-325 MG/15ML PO SOLN: 10.0000 mL | Freq: Four times a day (QID) | ORAL | Status: DC | PRN

## 2016-02-16 MED ORDER — DIAZEPAM 10 MG PO TABS: 10.0000 mg | ORAL_TABLET | Freq: Four times a day (QID) | ORAL | Status: DC | PRN

## 2016-02-16 NOTE — Progress Notes (Signed)
[name] Best presents for his 15th fraction of radiation to his Bilateral Neck. He reports he is attempting to eat, but finds it difficult because of lack of taste. He is drinking 1-3 ensures daily, but reports they give him loose stools. He will attempt to take immodium with his ensures. He also complains of a dry mouth, and needs to drink water with meals to assist swallowing. He drinks multiple bottles of water daily.  He is having at least daily bowel movements. He reports pain in his throat a 3/10, and a 5/10 when swallowing. He is using lidocaine and carafate mouth rinses 30 minutes before meals. He is also instilling ensure in his PEG tube at times. The skin to his neck is red, hyperpigmented, and he is using sonafine cream as directed. His mouth is clear, with thick saliva visible.   BP 164/96 mmHg  Pulse 76  Temp(Src) 98.2 F (36.8 C)  Ht 6\' 3"  (1.905 m)  Wt 252 lb 3.2 oz (114.397 kg)  BMI 31.52 kg/m2   Wt Readings from Last 3 Encounters:  02/16/16 252 lb 3.2 oz (114.397 kg)  02/08/16 258 lb (117.028 kg)  02/08/16 258 lb (117.028 kg)

## 2016-02-16 NOTE — Progress Notes (Signed)
  Radiation Oncology         [phone] ________________________________  Name: [name] Best MRN: [id number]  Date: 02/16/2016  DOB: 21-Dec-1961  Weekly Radiation Therapy Management  Cancer of base of tongue (HCC)  Current Dose: 30 Gy     Planned Dose:  70 Gy  Narrative . . . . . . . . The patient presents for routine under treatment assessment. [name] Best presents for his 15th fraction of radiation to his Bilateral Neck. He reports he is attempting to eat, but finds it difficult because of a lack of taste. He is drinking 1-3 ensures daily, but reports they give him loose stools. He will attempt to take imodium with his ensures. He also complains of a dry mouth and needs to drink water with meals to assist swallowing. He drinks multiple bottles of water daily. He is having at least daily bowel movements. He reports pain in his throat a 3/10, and a 5/10 when swallowing. He is using lidocaine and carafate mouth rinses 30 minutes before meals. He is also instilling ensure in his PEG tube at times. The skin to his neck is red, hyperpigmented, and he is using sonafine cream as directed. The patient presents to the clinic with his mother. He takes hydrocodone PRN for left hip pain. He is unable to swallow whole pills, but is able to swallow them when they are broken in half.                                 Set-up films were reviewed.                                 The chart was checked. Physical Findings. . .  height is 6\' 3"  (1.905 m) and weight is 252 lb 3.2 oz (114.397 kg). His temperature is 98.2 F (36.8 C). His blood pressure is 164/96 and his pulse is 76.   Lungs are clear to auscultation bilaterally. Heart has regular rate and rhythm. . Some erythema, but no skin breakdown of the neck. PEG tube site is healthy with no sign of infection. Some mucositis on the soft palate. Impression . . . . . . . The patient is tolerating radiation. Plan . . . . . . . . . . . . Continue treatment as  planned. I will prescribe Hycet in light of his difficulty swallowing pills. I will also refill his Valium. ________________________________   Blair Promise, PhD, MD  This document serves as a record of services personally performed by Gery Pray, MD. It was created on his behalf by Darcus Austin, a trained medical scribe. The creation of this record is based on the scribe's personal observations and the provider's statements to them. This document has been checked and approved by the attending provider.

## 2016-02-17 ENCOUNTER — Ambulatory Visit: Payer: Commercial Managed Care - HMO | Admitting: Nutrition

## 2016-02-17 ENCOUNTER — Ambulatory Visit
Admission: RE | Admit: 2016-02-17 | Discharge: 2016-02-17 | Disposition: A | Discharge status: Home / Self Care | Payer: Commercial Managed Care - HMO | Source: Ambulatory Visit | Attending: Radiation Oncology | Admitting: Radiation Oncology

## 2016-02-17 DIAGNOSIS — Z510 Encounter for antineoplastic radiation therapy: Secondary | ICD-10-CM | POA: Diagnosis not present

## 2016-02-17 NOTE — Progress Notes (Signed)
Nutrition follow-up completed with patient after radiation therapy for tongue cancer. Patient states he has had increased pain and more difficulty swallowing this past week. Reports Ensure Plus by mouth results in diarrhea.  Patient states Imodium has improved diarrhea after oral intake supplement. Weight decreased and documented as 252.8 pounds on February 21, down from 258 pounds February 13. Patient denies nausea and vomiting. Patient is agreeable to beginning using his tube for supplemental nutrition support.  Estimated nutrition needs: 2600-2800 calories,135-150 grams protein, 2.8 L fluid.  Nutrition diagnosis: Inadequate oral intake has continued.  Intervention:  Patient educated to use one can of Jevity 1.5 - 4 times a day with 120 cc free water before and after bolus feedings. This provides 1420 kcal, 60.1 grams protein, 1680 ml free water. Patient will begin bolus feedings at 10 AM and infuse every 4 hours.  Patient understands to remain upright after feeding. Stressed the importance of patient using room temperature formula along with slower infusion via PEG. Patient able to repeat back enteral nutrition prescription. Provided samples of enteral nutrition. Teach back method used.  Monitoring, evaluation, goals: Patient has had reduced oral intake resulting weight loss. Will begin enteral tube feeding and increase as tolerated.  Next visit: Wednesday, March 1.  After radiation treatment.  **Disclaimer: This note was dictated with voice recognition software. Similar sounding words can inadvertently be transcribed and this note may contain transcription errors which may not have been corrected upon publication of note.**

## 2016-02-18 ENCOUNTER — Ambulatory Visit
Admission: RE | Admit: 2016-02-18 | Discharge: 2016-02-18 | Disposition: A | Discharge status: Home / Self Care | Payer: Commercial Managed Care - HMO | Source: Ambulatory Visit | Attending: Radiation Oncology | Admitting: Radiation Oncology

## 2016-02-18 ENCOUNTER — Ambulatory Visit: Payer: Commercial Managed Care - HMO | Attending: Radiation Oncology

## 2016-02-18 DIAGNOSIS — R1310 Dysphagia, unspecified: Secondary | ICD-10-CM | POA: Diagnosis not present

## 2016-02-18 DIAGNOSIS — Z510 Encounter for antineoplastic radiation therapy: Secondary | ICD-10-CM | POA: Diagnosis not present

## 2016-02-18 NOTE — Patient Instructions (Signed)
  Alternative to the Shaker:  Roll up a towel and put in under your chin Press your chin to your chest and hold the towel with your chin for 60 seconds, 3 times

## 2016-02-18 NOTE — Therapy (Signed)
[facility] [address] [place], [place], [address] Phone: [phone]   Fax:  [phone]  Speech Language Pathology Treatment  Patient Details  Name: [name] Best MRN: [id number] Date of Birth: 1961/11/09 No Data Recorded  Encounter Date: 02/18/2016      End of Session - 02/18/16 1032    Visit Number 2   Number of Visits 7   Date for SLP Re-Evaluation 07/06/16   SLP Start Time 0932   SLP Stop Time  1007  pt did not require >35 minutes today   SLP Time Calculation (min) 35 min   Activity Tolerance Patient tolerated treatment well      Past Medical History  Diagnosis Date  . Diverticulitis     hospitalized in 2003  . Chronic back pain   . Hyperlipidemia   . Anxiety   . PONV (postoperative nausea and vomiting)   . C. difficile diarrhea 2014    treated with Flagyl  . Cancer [facility])     Past Surgical History  Procedure Laterality Date  . Gallbladder surgery    . Left elbow    . Colonoscopy N/A 07/02/2013    Jenkins:Sessile polyp (tubular adenoma) ranging between 3-51mm in size in the proximal sigmoid colon/mild diverticulosis   . Esophagogastroduodenoscopy N/A 07/02/2013    Jenkins:4 cm hiatal hernia/esophagus appeared normal/chronic gastritis. Clotest negative.  . Colonoscopy      Dr. Laural Golden: prior to 2003 per patient  . Knee arthroscopy with medial menisectomy Left 04/14/2015    Procedure: KNEE ARTHROSCOPY WITH MEDIAL MENISECTOMY;  Surgeon: Sanjuana Kava, MD;  Location: [facility];  Service: Orthopedics;  Laterality: Left;  . Cholecystectomy    . Direct laryngoscopy N/A 12/10/2015    Procedure: DIRECT LARYNGOSCOPY;  Surgeon: Leta Baptist, MD;  Location: [facility];  Service: ENT;  Laterality: N/A;  . Mass biopsy Right 12/10/2015    Procedure: RIGHT EXCISIONAL NECK MASS BIOPSY;  Surgeon: Leta Baptist, MD;  Location: [facility];  Service: ENT;  Laterality: Right;  . Tongue biopsy Right  12/10/2015    Procedure: TONGUE BIOPSY;  Surgeon: Leta Baptist, MD;  Location: [facility];  Service: ENT;  Laterality: Right;    There were no vitals filed for this visit.  Visit Diagnosis: Dysphagia      Subjective Assessment - 02/18/16 0936    Subjective "This is my fourth week of seven."               ADULT SLP TREATMENT - 02/18/16 0941    General Information   Behavior/Cognition Alert;Cooperative;Pleasant mood   Treatment Provided   Treatment provided Dysphagia   Dysphagia Treatment   Temperature Spikes Noted No   Respiratory Status Room air   Treatment Methods Therapeutic exercise;Patient/caregiver education;Skilled observation   Patient observed directly with PO's Yes   Type of PO's observed Dysphagia 3 (soft);Thin liquids   Feeding Able to feed self   Liquids provided via Cup   Oral Phase Signs & Symptoms --  none noted   Pharyngeal Phase Signs & Symptoms --  none noted   Other treatment/comments Pt reports no difficulty with coughing or throat clearing during meals, and denies s/s aspiration PNA. He did not exhibit s/s aspiration during POs today (american cheese and water). Thyroid elevation appeared adequate/WNL. Despite pt report of xerostomia, oral residue was negligible. Pt req'd rare min A with HEP, mainly with hold times (too long). Pt's frequency of HEP appears adequate, as pt  did not require looking at the HEP for A with procedure. Encouraged pt to cont with all exercises as well as POs as deep into rad tx as possible.   Assessment / Recommendations / Plan   Plan Continue with current plan of care   Dysphagia Recommendations   Diet recommendations Regular;Thin liquid  as tolerated   Compensations Effortful swallow   Progression Toward Goals   Progression toward goals Progressing toward goals          SLP Education - 02/18/16 1024    Education provided Yes   Education Details alternative to Shaker due to pt back pain   Person(s)  Educated Patient   Methods Explanation;Demonstration;Handout   Comprehension Verbalized understanding;Returned demonstration          SLP Short Term Goals - 02/18/16 1035    SLP SHORT TERM GOAL #1   Title pt will perform HEP with rare min A   Status Achieved   SLP SHORT TERM GOAL #2   Title pt will tell SLP why he is completing HEP   Time 2   Period --  visits   Status New   SLP SHORT TERM GOAL #3   Title pt will tell SLP 3 s/s aspiration PNA   Time 3   Period --  visits   Status New          SLP Long Term Goals - 02/18/16 1035    SLP LONG TERM GOAL #1   Title pt will tell SLP why a food journal can be beneficial for return to full, least restrictive PO diet   Time 4   Period --  visits   Status On-going   SLP LONG TERM GOAL #2   Title pt will complete HEP independently over 3 visits   Time 6   Period --  visits   Status On-going          Plan - 02/18/16 1033    Clinical Impression Statement WNL swallowing cont'd, however pt reports pain when swallowing, 3/10. Pt req'd min A from SLP for hold times, but procedure for HEP was independent. Skilled ST for assessment of cont'd correct procedure of HEP as well as assess safety with PO intake.   Speech Therapy Frequency --  approx every four weeks   Duration --  5 visits   Treatment/Interventions Aspiration precaution training;Pharyngeal strengthening exercises;Oral motor exercises;Diet toleration management by SLP;Trials of upgraded texture/liquids;Compensatory techniques;Cueing hierarchy   Potential to Achieve Goals Good   SLP Home Exercise Plan Modification to Shaker provided today 02-18-16   Consulted and Agree with Plan of Care Patient        Problem List Patient Active Problem List   Diagnosis Date Noted  . Prerenal acute renal failure ([place]) 02/01/2016  . Tinnitus of both ears 02/01/2016  . Dehydration 01/29/2016  . Cancer of base of tongue ([place]) 12/16/2015  . Diarrhea 09/19/2013  . GERD  (gastroesophageal reflux disease) 09/19/2013  . Diverticulosis 08/09/2013  . LLQ pain 08/09/2013  . Chondromalacia of left patella 08/16/2011    [facility] ,MS, CCC-SLP  02/18/2016, 10:36 AM  [facility] [address] [place], [place], [address] Phone: [phone]   Fax:  [phone]   Name: [name] Best MRN: [id number] Date of Birth: May 19, 1961

## 2016-02-19 ENCOUNTER — Ambulatory Visit
Admission: RE | Admit: 2016-02-19 | Discharge: 2016-02-19 | Disposition: A | Discharge status: Home / Self Care | Payer: Commercial Managed Care - HMO | Source: Ambulatory Visit | Attending: Radiation Oncology | Admitting: Radiation Oncology

## 2016-02-19 ENCOUNTER — Encounter: Payer: Self-pay | Admitting: *Deleted

## 2016-02-19 DIAGNOSIS — Z510 Encounter for antineoplastic radiation therapy: Secondary | ICD-10-CM | POA: Diagnosis not present

## 2016-02-19 NOTE — Progress Notes (Signed)
  Oncology Nurse Navigator Documentation  Navigator Location: [facility] (02/19/16 1610) Navigator Encounter Type: Treatment (02/19/16 9604)             Treatment Phase: Active Tx (02/19/16 0905) Barriers/Navigation Needs: Education (02/19/16 0905)         Education Method: Verbal (02/19/16 5409)        Met with [name] Best following his Tomo tmt to check on his well-being. He reported:  Drinking 4 cans of supplement daily, going to increase to 5 to ward off additional weight loss.  Using lidocaine to sooth throat prior to meals.  Using Carafate at HS.  Mouth pain when using fluoride treatment, "stings".  I suggested he rinse his mouth with either lidocaine or Carafate prior to procedure.  He indicated he would give that a try.  Applying Sonafine daily.  I recommended he apply BID to maximize skin intergrity, he voiced understanding. He denied any further concerns/needs.  I encouraged him to call me should that change.  [name], RN, BSN, [place] at [place] [phone]             Time Spent with Patient: 15 (02/19/16 0905)

## 2016-02-22 ENCOUNTER — Ambulatory Visit
Admission: RE | Admit: 2016-02-22 | Discharge: 2016-02-22 | Disposition: A | Discharge status: Home / Self Care | Payer: Commercial Managed Care - HMO | Source: Ambulatory Visit | Attending: Radiation Oncology | Admitting: Radiation Oncology

## 2016-02-22 ENCOUNTER — Encounter: Payer: Self-pay | Admitting: *Deleted

## 2016-02-22 ENCOUNTER — Encounter: Payer: Self-pay | Admitting: Radiation Oncology

## 2016-02-22 VITALS — BP 142/94 | HR 116 | Temp 98.5°F | Ht 75.0 in | Wt 250.0 lb

## 2016-02-22 DIAGNOSIS — C01 Malignant neoplasm of base of tongue: Secondary | ICD-10-CM

## 2016-02-22 DIAGNOSIS — Z510 Encounter for antineoplastic radiation therapy: Secondary | ICD-10-CM | POA: Diagnosis not present

## 2016-02-22 NOTE — Progress Notes (Signed)
    Weekly Management Note:  Outpatient    ICD-9-CM ICD-10-CM   1. Cancer of base of tongue (HCC) 141.0 C01     Current Dose:  38 Gy  Projected Dose: 70 Gy   Narrative:  The patient presents for routine under treatment assessment.  CBCT/MVCT images/Port film x-rays were reviewed.  The chart was checked.  Pain alleviated by occasional narcotics.  Little complaints.  Swallowing well. Drinking 6 pints of H2O daily.   Physical Findings:  Wt Readings from Last 3 Encounters:  02/22/16 250 lb (113.399 kg)  02/16/16 252 lb 3.2 oz (114.397 kg)  02/08/16 258 lb (117.028 kg)   Filed Vitals:   02/22/16 0908 02/22/16 0912  BP: 131/95 142/94  Pulse: 104 116  Temp: 98.5 F (36.9 C)    Skin intact. Patchy white mucositis over palate/oropharynx. No obvious thrush  CBC    Component Value Date/Time   WBC 5.9 02/04/2016 0841   WBC 9.9 01/22/2016 0800   RBC 4.95 02/04/2016 0841   RBC 5.20 01/22/2016 0800   HGB 14.1 02/04/2016 0841   HGB 15.2 01/22/2016 0800   HCT 42.7 02/04/2016 0841   HCT 46.5 01/22/2016 0800   PLT 136* 02/04/2016 0841   PLT 226 01/22/2016 0800   MCV 86.3 02/04/2016 0841   MCV 89.4 01/22/2016 0800   MCH 28.5 02/04/2016 0841   MCH 29.2 01/22/2016 0800   MCHC 33.0 02/04/2016 0841   MCHC 32.7 01/22/2016 0800   RDW 12.8 02/04/2016 0841   RDW 13.6 01/22/2016 0800   LYMPHSABS 0.5* 02/04/2016 0841   LYMPHSABS 1.2 01/22/2016 0800   MONOABS 0.4 02/04/2016 0841   MONOABS 0.8 01/22/2016 0800   EOSABS 0.0 02/04/2016 0841   EOSABS 0.2 01/22/2016 0800   BASOSABS 0.0 02/04/2016 0841   BASOSABS 0.1 01/22/2016 0800     CMP     Component Value Date/Time   NA 138 02/10/2016 0933   NA 138 01/22/2016 0800   K 4.2 02/10/2016 0933   K 4.0 01/22/2016 0800   CL 104 01/22/2016 0800   CO2 27 02/10/2016 0933   CO2 25 01/22/2016 0800   GLUCOSE 101 02/10/2016 0933   GLUCOSE 114* 01/22/2016 0800   BUN 16.1 02/10/2016 0933   BUN 15 01/22/2016 0800   CREATININE 1.3 02/10/2016  0933   CREATININE 1.01 01/22/2016 0800   CALCIUM 9.5 02/10/2016 0933   CALCIUM 9.1 01/22/2016 0800   PROT 7.2 02/10/2016 0933   PROT 7.4 01/22/2016 0800   ALBUMIN 3.5 02/10/2016 0933   ALBUMIN 3.9 01/22/2016 0800   AST 12 02/10/2016 0933   AST 18 01/22/2016 0800   ALT 18 02/10/2016 0933   ALT 19 01/22/2016 0800   ALKPHOS 84 02/10/2016 0933   ALKPHOS 89 01/22/2016 0800   BILITOT 0.49 02/10/2016 0933   BILITOT 1.0 01/22/2016 0800   GFRNONAA >60 01/22/2016 0800   GFRAA >60 01/22/2016 0800     Impression:  The patient is tolerating radiotherapy.   Plan:  Continue radiotherapy as planned.   -----------------------------------  Eppie Gibson, MD

## 2016-02-22 NOTE — Progress Notes (Signed)
[name] Best presents for his 19th fraction of radiation to his Bilateral Neck, Base of Tongue. He is drinking 4 cabs of Jevity 1.5 daily, and 6 Liters of room temperature water a day also. He reports pain when swallowing, and during the night. He is using the carafate and lidocaine rinses frequently, and may try the pain medicine to help him sleep better at night. He is doing swallowing exercises 5-6 times a day. He is also attempting to walk daily. He is using the sonafine cream twice daily. He does have an area of dry skin to the right side of his neck, which he can use neosporin for if needed. The rest of the skin to his neck is hyperpigmented and slightly tender. He does have several white at the back of his mouth, which he reports are tender.    BP 142/94 mmHg  Pulse 116  Temp(Src) 98.5 F (36.9 C)  Ht 6\' 3"  (1.905 m)  Wt 250 lb (113.399 kg)  BMI 31.25 kg/m2  SpO2 100%   Orthostatics: BP sitting 131/95 pulse 104. BP standing 142/94 pulse 116  Wt Readings from Last 3 Encounters:  02/22/16 250 lb (113.399 kg)  02/16/16 252 lb 3.2 oz (114.397 kg)  02/08/16 258 lb (117.028 kg)

## 2016-02-23 ENCOUNTER — Ambulatory Visit
Admission: RE | Admit: 2016-02-23 | Discharge: 2016-02-23 | Disposition: A | Discharge status: Home / Self Care | Payer: Commercial Managed Care - HMO | Source: Ambulatory Visit | Attending: Radiation Oncology | Admitting: Radiation Oncology

## 2016-02-23 DIAGNOSIS — Z510 Encounter for antineoplastic radiation therapy: Secondary | ICD-10-CM | POA: Diagnosis not present

## 2016-02-24 ENCOUNTER — Ambulatory Visit
Admission: RE | Admit: 2016-02-24 | Discharge: 2016-02-24 | Disposition: A | Discharge status: Home / Self Care | Payer: Commercial Managed Care - HMO | Source: Ambulatory Visit | Attending: Radiation Oncology | Admitting: Radiation Oncology

## 2016-02-24 ENCOUNTER — Ambulatory Visit: Payer: Commercial Managed Care - HMO | Admitting: Nutrition

## 2016-02-24 DIAGNOSIS — Z510 Encounter for antineoplastic radiation therapy: Secondary | ICD-10-CM | POA: Diagnosis not present

## 2016-02-24 NOTE — Progress Notes (Signed)
Nutrition follow-up completed with patient after radiation therapy for tongue cancer. Patient reports he is unable to consume solid foods but is drinking Jevity 1.5 without difficulty.  He doesn't mind the taste; it has no taste. Reports he is having 1 bowel movement daily but denies diarrhea. Weight decreased and documented as 250 pounds on February 27, which is decreased 2.8 pounds since February 21. Patient reports flushing his feeding tube with water daily. He is drinking free water throughout the day.  Estimated nutrition needs: 2600-2800 calories, 135-150 grams protein, 2.8 L fluid  Nutrition diagnosis: Inadequate oral intake continues.  Intervention: Increase Jevity 1.5-5 times daily and add two cartons boost very high-calorie daily. Patient was educated to consume by mouth if able.  If unable to tolerate by mouth, patient understands he must use his feeding tube for enteral nutrition. Educated patient to drink one supplement every 2-3 hours as tolerated.   5 cans Jevity 1.5+ 2 cartons boost very high calorie will provide 2836 cal, 120 g protein, 1220 mL free water. Patient educated to consume a minimum of 7 cups of water/1600 cc daily by mouth or through tube. Teach back method used.  Samples provided.  Monitoring, evaluation, goals: Patient will tolerate oral nutrition supplements at goal rate to minimize further weight loss.  Next visit: Wednesday, March 8 after radiation treatment.  **Disclaimer: This note was dictated with voice recognition software. Similar sounding words can inadvertently be transcribed and this note may contain transcription errors which may not have been corrected upon publication of note.**

## 2016-02-25 ENCOUNTER — Ambulatory Visit
Admission: RE | Admit: 2016-02-25 | Discharge: 2016-02-25 | Disposition: A | Discharge status: Home / Self Care | Payer: Commercial Managed Care - HMO | Source: Ambulatory Visit | Attending: Radiation Oncology | Admitting: Radiation Oncology

## 2016-02-25 DIAGNOSIS — Z510 Encounter for antineoplastic radiation therapy: Secondary | ICD-10-CM | POA: Diagnosis not present

## 2016-02-26 ENCOUNTER — Ambulatory Visit
Admission: RE | Admit: 2016-02-26 | Discharge: 2016-02-26 | Disposition: A | Discharge status: Home / Self Care | Payer: Commercial Managed Care - HMO | Source: Ambulatory Visit | Attending: Radiation Oncology | Admitting: Radiation Oncology

## 2016-02-26 DIAGNOSIS — Z510 Encounter for antineoplastic radiation therapy: Secondary | ICD-10-CM | POA: Diagnosis not present

## 2016-02-27 ENCOUNTER — Encounter: Payer: Self-pay | Admitting: *Deleted

## 2016-02-27 NOTE — Progress Notes (Signed)
  Oncology Nurse Navigator Documentation  Navigator Location: [facility] (02/22/16 0850) Navigator Encounter Type: Treatment;Clinic/MDC (02/22/16 0850)           Patient Visit Type: RadOnc (02/22/16 0850)   Barriers/Navigation Needs: Education (02/22/16 0850) Education: Pain/ Symptom Management (02/22/16 0850)       Education Method: Verbal (02/22/16 4039)        Met with [name] during Weekly Under Treat appointment with Dr. [name].  He was accompanied by his mother.  He reported:  Applying Sonafine BID per my guidance.  I educated him on recommended method for washing neck.  Drinking 4 cans of Jevity 1.5 daily, 6-8 bottles of water daily.  Flushing PEG daily.  Conducting swallowing exercises 5-6 times daily.  Bilateral ringing in ears since single dose of cisplatin.  He reports gradual resolution.  Intensifying throat soreness, hesitancy to use prescribed Rx for pain control bco concern "how will make me feel".  RN Anderson Malta and this navigator encouraged him to take medications as prescribed, educated him on rationale for pain control, encouraged him to take after meal to avoid stomach distress.   He understands I can be contacted with needs/concerns.  [name], RN, BSN, [place] at [place] [phone]

## 2016-02-27 NOTE — Progress Notes (Signed)
A user error has taken place: encounter opened in error, closed for administrative reasons.

## 2016-02-29 ENCOUNTER — Ambulatory Visit
Admission: RE | Admit: 2016-02-29 | Discharge: 2016-02-29 | Disposition: A | Discharge status: Home / Self Care | Payer: Commercial Managed Care - HMO | Source: Ambulatory Visit | Attending: Radiation Oncology | Admitting: Radiation Oncology

## 2016-02-29 ENCOUNTER — Encounter: Payer: Self-pay | Admitting: Radiation Oncology

## 2016-02-29 VITALS — BP 123/94 | HR 87 | Temp 98.0°F | Resp 98 | Ht 75.0 in | Wt 249.9 lb

## 2016-02-29 DIAGNOSIS — C01 Malignant neoplasm of base of tongue: Secondary | ICD-10-CM

## 2016-02-29 DIAGNOSIS — Z510 Encounter for antineoplastic radiation therapy: Secondary | ICD-10-CM | POA: Diagnosis not present

## 2016-02-29 MED ORDER — HYDROCODONE-ACETAMINOPHEN 5-325 MG PO TABS: 1.0000 | ORAL_TABLET | ORAL | Status: DC | PRN

## 2016-02-29 MED ORDER — FLUCONAZOLE 100 MG PO TABS: ORAL_TABLET | ORAL | Status: DC

## 2016-02-29 NOTE — Progress Notes (Signed)
[name] Best is here for his 24th fraction of radiation to his Bilateral Neck. He denies pain at this time. He reports he only has pain when swallowing which he rates a 8/10. He is using the carafate and lidocaine rinses before eating. He tried the liquid hydrocodone but reports it burned his throat while going down. He prefers the hydrocodone pills which are easier going down. He is drinking 5 cans of Jevity and 2 cans of boost daily orally. He reports drinking six 17 ounce bottles of water daily, orally. He is not using his PEG tube except to flush twice daily. He is not eating anything else by mouth. He reports having 3 episodes of black and dark red sputum yesterday morning. He has not had any episodes since that time. He does complain of nausea which he relates thickened saliva in his throat. His mouth is sore, with some white spots visible. He reports some spots have gone away. He is using baking soda rinses, minus the salt because of burning. His neck is red, tender with some dry areas noted. He does complain of itching at his neck area. He is using the sonafine cream twice daily.   BP 123/94 mmHg  Pulse 87  Temp(Src) 98 F (36.7 C)  Resp 98  Ht 6\' 3"  (1.905 m)  Wt 249 lb 14.4 oz (113.354 kg)  BMI 31.24 kg/m2  SpO2 100%   Wt Readings from Last 3 Encounters:  02/29/16 249 lb 14.4 oz (113.354 kg)  02/22/16 250 lb (113.399 kg)  02/16/16 252 lb 3.2 oz (114.397 kg)

## 2016-02-29 NOTE — Progress Notes (Signed)
Weekly Management Note:  Outpatient    ICD-9-CM ICD-10-CM   1. Cancer of base of tongue (HCC) 141.0 C01 HYDROcodone-acetaminophen (NORCO/VICODIN) 5-325 MG tablet     fluconazole (DIFLUCAN) 100 MG tablet    Current Dose:  48 Gy  Projected Dose: 70 Gy   Narrative:  The patient presents for routine under treatment assessment.  CBCT/MVCT images/Port film x-rays were reviewed.  The chart was checked.   [name] Best is here for his 24th fraction of radiation to his Bilateral Neck. He denies pain at this time. He reports he only has pain when swallowing which he rates a 8/10. He is using the carafate and lidocaine rinses before eating. He tried the liquid hydrocodone but reports it burned his throat while going down. He prefers the hydrocodone pills which are easier going down. He is drinking 5 cans of Jevity and 2 cans of boost daily orally. He reports drinking six 17 ounce bottles of water daily, orally. He is not using his PEG tube except to flush twice daily. He is not eating anything else by mouth. He reports having 3 episodes of black and dark red sputum yesterday morning. He has not had any episodes since that time. He does complain of nausea which he relates thickened saliva in his throat. His mouth is sore, with some white spots visible. He reports some spots have gone away. He is using baking soda rinses, minus the salt because of burning.  He is using the sonafine cream twice daily.     Physical Findings:  Wt Readings from Last 3 Encounters:  02/29/16 249 lb 14.4 oz (113.354 kg)  02/22/16 250 lb (113.399 kg)  02/16/16 252 lb 3.2 oz (114.397 kg)   Filed Vitals:   02/29/16 0908  BP: 123/94  Pulse: 87  Temp: 98 F (36.7 C)  Resp: 98   Patchy white mucositis over palate/oropharynx vs thrush.  Erythema of mucosa over palate.  Trismus. Dry hyperpigmented skin over neck. CBC    Component Value Date/Time   WBC 5.9 02/04/2016 0841   WBC 9.9 01/22/2016 0800   RBC 4.95 02/04/2016  0841   RBC 5.20 01/22/2016 0800   HGB 14.1 02/04/2016 0841   HGB 15.2 01/22/2016 0800   HCT 42.7 02/04/2016 0841   HCT 46.5 01/22/2016 0800   PLT 136* 02/04/2016 0841   PLT 226 01/22/2016 0800   MCV 86.3 02/04/2016 0841   MCV 89.4 01/22/2016 0800   MCH 28.5 02/04/2016 0841   MCH 29.2 01/22/2016 0800   MCHC 33.0 02/04/2016 0841   MCHC 32.7 01/22/2016 0800   RDW 12.8 02/04/2016 0841   RDW 13.6 01/22/2016 0800   LYMPHSABS 0.5* 02/04/2016 0841   LYMPHSABS 1.2 01/22/2016 0800   MONOABS 0.4 02/04/2016 0841   MONOABS 0.8 01/22/2016 0800   EOSABS 0.0 02/04/2016 0841   EOSABS 0.2 01/22/2016 0800   BASOSABS 0.0 02/04/2016 0841   BASOSABS 0.1 01/22/2016 0800     CMP     Component Value Date/Time   NA 138 02/10/2016 0933   NA 138 01/22/2016 0800   K 4.2 02/10/2016 0933   K 4.0 01/22/2016 0800   CL 104 01/22/2016 0800   CO2 27 02/10/2016 0933   CO2 25 01/22/2016 0800   GLUCOSE 101 02/10/2016 0933   GLUCOSE 114* 01/22/2016 0800   BUN 16.1 02/10/2016 0933   BUN 15 01/22/2016 0800   CREATININE 1.3 02/10/2016 0933   CREATININE 1.01 01/22/2016 0800   CALCIUM 9.5 02/10/2016 0933  CALCIUM 9.1 01/22/2016 0800   PROT 7.2 02/10/2016 0933   PROT 7.4 01/22/2016 0800   ALBUMIN 3.5 02/10/2016 0933   ALBUMIN 3.9 01/22/2016 0800   AST 12 02/10/2016 0933   AST 18 01/22/2016 0800   ALT 18 02/10/2016 0933   ALT 19 01/22/2016 0800   ALKPHOS 84 02/10/2016 0933   ALKPHOS 89 01/22/2016 0800   BILITOT 0.49 02/10/2016 0933   BILITOT 1.0 01/22/2016 0800   GFRNONAA >60 01/22/2016 0800   GFRAA >60 01/22/2016 0800     Impression:  The patient is tolerating radiotherapy.   Plan:  Continue radiotherapy as planned.  Changed Hyect to Vicodin q3hrs prn.  Apply Sonafine TID.  Fluconazole for thrush.  Hold Pravastatin while on Fluconazole.  Limited episodes of bloody sputum not a clinical concern at this time. Pt to let us know if they become more frequent/copious. Encouraged to continue trismus  exercises. -----------------------------------  Eppie Gibson, MD

## 2016-03-01 ENCOUNTER — Ambulatory Visit
Admission: RE | Admit: 2016-03-01 | Discharge: 2016-03-01 | Disposition: A | Discharge status: Home / Self Care | Payer: Commercial Managed Care - HMO | Source: Ambulatory Visit | Attending: Radiation Oncology | Admitting: Radiation Oncology

## 2016-03-01 DIAGNOSIS — Z510 Encounter for antineoplastic radiation therapy: Secondary | ICD-10-CM | POA: Diagnosis not present

## 2016-03-02 ENCOUNTER — Ambulatory Visit: Payer: Commercial Managed Care - HMO | Admitting: Nutrition

## 2016-03-02 ENCOUNTER — Encounter: Payer: Self-pay | Admitting: Nutrition

## 2016-03-02 ENCOUNTER — Ambulatory Visit
Admission: RE | Admit: 2016-03-02 | Discharge: 2016-03-02 | Disposition: A | Discharge status: Home / Self Care | Payer: Commercial Managed Care - HMO | Source: Ambulatory Visit | Attending: Radiation Oncology | Admitting: Radiation Oncology

## 2016-03-02 DIAGNOSIS — Z510 Encounter for antineoplastic radiation therapy: Secondary | ICD-10-CM | POA: Diagnosis not present

## 2016-03-02 NOTE — Progress Notes (Signed)
Nutrition follow-up completed with patient after radiation therapy for tongue cancer. Patient is not eating any solid food at this time. He is drinking Jevity 1.5, 5 cans daily +2 bottles of boost very high-calorie to provide 2836 cal, 120 g protein, 1220 mL free water. Patient is consuming greater than 1600 cc of free water daily by mouth. Patient is flushing his feeding tube daily with water. Patient reports some nausea with increased sputum. Patient denies constipation. Weight was documented as 249.9 pounds March 6 decreased from 250 pounds February 27.  Estimated nutrition needs: 2600-2800 calories, 135-150 grams protein, 2.8 L fluid.  Nutrition diagnosis: Inadequate oral intake has improved.  Intervention:  Educated patient to continue to drink oral nutrition supplements as tolerated to provide 100% of estimated calorie needs. Encouraged patient to begin using feeding tube if he was unable to swallow oral nutrition supplements. Teach back method used.  Monitoring, evaluation, goals: Patient will tolerate adequate calories and protein to promote weight maintenance.  Next visit: Wednesday, March 15 after radiation therapy.  **Disclaimer: This note was dictated with voice recognition software. Similar sounding words can inadvertently be transcribed and this note may contain transcription errors which may not have been corrected upon publication of note.**

## 2016-03-03 ENCOUNTER — Ambulatory Visit
Admission: RE | Admit: 2016-03-03 | Discharge: 2016-03-03 | Disposition: A | Discharge status: Home / Self Care | Payer: Commercial Managed Care - HMO | Source: Ambulatory Visit | Attending: Radiation Oncology | Admitting: Radiation Oncology

## 2016-03-03 DIAGNOSIS — Z510 Encounter for antineoplastic radiation therapy: Secondary | ICD-10-CM | POA: Diagnosis not present

## 2016-03-04 ENCOUNTER — Ambulatory Visit
Admission: RE | Admit: 2016-03-04 | Discharge: 2016-03-04 | Disposition: A | Discharge status: Home / Self Care | Payer: Commercial Managed Care - HMO | Source: Ambulatory Visit | Attending: Radiation Oncology | Admitting: Radiation Oncology

## 2016-03-04 DIAGNOSIS — Z510 Encounter for antineoplastic radiation therapy: Secondary | ICD-10-CM | POA: Diagnosis not present

## 2016-03-07 ENCOUNTER — Ambulatory Visit
Admission: RE | Admit: 2016-03-07 | Discharge: 2016-03-07 | Disposition: A | Discharge status: Home / Self Care | Payer: Commercial Managed Care - HMO | Source: Ambulatory Visit | Attending: Radiation Oncology | Admitting: Radiation Oncology

## 2016-03-07 ENCOUNTER — Ambulatory Visit: Payer: Self-pay

## 2016-03-07 ENCOUNTER — Encounter: Payer: Self-pay | Admitting: Radiation Oncology

## 2016-03-07 VITALS — BP 130/100 | HR 111 | Temp 98.0°F | Ht 75.0 in | Wt 244.2 lb

## 2016-03-07 DIAGNOSIS — C01 Malignant neoplasm of base of tongue: Secondary | ICD-10-CM

## 2016-03-07 DIAGNOSIS — Z510 Encounter for antineoplastic radiation therapy: Secondary | ICD-10-CM | POA: Diagnosis not present

## 2016-03-07 MED ORDER — HYDROCODONE-ACETAMINOPHEN 7.5-325 MG PO TABS: 1.0000 | ORAL_TABLET | ORAL | Status: DC | PRN

## 2016-03-07 NOTE — Progress Notes (Signed)
Weekly Management Note:  Outpatient    ICD-9-CM ICD-10-CM   1. Cancer of base of tongue (HCC) 141.0 C01 HYDROcodone-acetaminophen (NORCO) 7.5-325 MG tablet    Current Dose:  58 Gy  Projected Dose: 70 Gy   Narrative:  The patient presents for routine under treatment assessment.  CBCT/MVCT images/Port film x-rays were reviewed.  The chart was checked.   [name] Best presents for his 29th fraction of radiation to his Base of Tongue, Bilateral Neck. He report pain when swallowing which he rates 8/10. He reports it improves after he has swallowed a few times. He reports his worse pain is when he wakes up in the morning and improves as the day goes on. He reports taking norco 5 times last night between 11:30 pm and 7 am. It has just become worse over the last few days. He is also taking norco before meals. He is using the lidocaine solution before meals, but has not been using the carafate.  He reports coughing up occasional blood. This morning he reports a brighter color red blood clot coming up mixed with mucus. He reports drinking Jevity and boost daily. 3 cans of boost and 3 cans of Jevity most days. This is his only intake. He is drinking 5-6 17 ounce bottles of water daily. He is not using his PEG tube except to flush it. He reports thick saliva, and is not using the baking soda rinses very often.     He is using the sonafine cream as directed.    Orthostatics: BP sitting 127/98 pulse 104. BP standing 130/100 pulse 111.  Wt Readings from Last 3 Encounters:  03/07/16 244 lb 3.2 oz (110.768 kg)  02/29/16 249 lb 14.4 oz (113.354 kg)  02/22/16 250 lb (113.399 kg)    Physical Findings:  Wt Readings from Last 3 Encounters:  03/07/16 244 lb 3.2 oz (110.768 kg)  02/29/16 249 lb 14.4 oz (113.354 kg)  02/22/16 250 lb (113.399 kg)   Filed Vitals:   03/07/16 0915 03/07/16 0917  BP: 127/98 130/100  Pulse: 104 111  Temp: 98 F (36.7 C)    Patchy white mucositis over palate/oropharynx with  Erythema of mucosa. Trismus. Dry peeling hyperpigmented skin over neck.  CBC    Component Value Date/Time   WBC 5.9 02/04/2016 0841   WBC 9.9 01/22/2016 0800   RBC 4.95 02/04/2016 0841   RBC 5.20 01/22/2016 0800   HGB 14.1 02/04/2016 0841   HGB 15.2 01/22/2016 0800   HCT 42.7 02/04/2016 0841   HCT 46.5 01/22/2016 0800   PLT 136* 02/04/2016 0841   PLT 226 01/22/2016 0800   MCV 86.3 02/04/2016 0841   MCV 89.4 01/22/2016 0800   MCH 28.5 02/04/2016 0841   MCH 29.2 01/22/2016 0800   MCHC 33.0 02/04/2016 0841   MCHC 32.7 01/22/2016 0800   RDW 12.8 02/04/2016 0841   RDW 13.6 01/22/2016 0800   LYMPHSABS 0.5* 02/04/2016 0841   LYMPHSABS 1.2 01/22/2016 0800   MONOABS 0.4 02/04/2016 0841   MONOABS 0.8 01/22/2016 0800   EOSABS 0.0 02/04/2016 0841   EOSABS 0.2 01/22/2016 0800   BASOSABS 0.0 02/04/2016 0841   BASOSABS 0.1 01/22/2016 0800     CMP     Component Value Date/Time   NA 138 02/10/2016 0933   NA 138 01/22/2016 0800   K 4.2 02/10/2016 0933   K 4.0 01/22/2016 0800   CL 104 01/22/2016 0800   CO2 27 02/10/2016 0933   CO2 25 01/22/2016 0800  GLUCOSE 101 02/10/2016 0933   GLUCOSE 114* 01/22/2016 0800   BUN 16.1 02/10/2016 0933   BUN 15 01/22/2016 0800   CREATININE 1.3 02/10/2016 0933   CREATININE 1.01 01/22/2016 0800   CALCIUM 9.5 02/10/2016 0933   CALCIUM 9.1 01/22/2016 0800   PROT 7.2 02/10/2016 0933   PROT 7.4 01/22/2016 0800   ALBUMIN 3.5 02/10/2016 0933   ALBUMIN 3.9 01/22/2016 0800   AST 12 02/10/2016 0933   AST 18 01/22/2016 0800   ALT 18 02/10/2016 0933   ALT 19 01/22/2016 0800   ALKPHOS 84 02/10/2016 0933   ALKPHOS 89 01/22/2016 0800   BILITOT 0.49 02/10/2016 0933   BILITOT 1.0 01/22/2016 0800   GFRNONAA >60 01/22/2016 0800   GFRAA >60 01/22/2016 0800     Impression:  The patient is tolerating radiotherapy.   Plan:  Continue radiotherapy as planned.  Increased pain med doses to tablets as above in diagnosis   Limited episodes of bloody sputum  not a clinical concern at this time. Pt to let us know if they become more frequent/copious.  Take in one more Jevity per day to address weight loss. -----------------------------------  Eppie Gibson, MD

## 2016-03-07 NOTE — Progress Notes (Signed)
[name] Best presents for his 29th fraction of radiation to his Base of Tongue, Bilateral Neck. He report pain when swallowing which he rates 8/10. He reports it improves after he has swallowed a few times. He reports his worse pain is when he wakes up in the morning and improves as the day goes on. He reports taking norco 5 times last night between 11:30 pm and 7 am. It has just become worse over the last few days. He is also taking norco before meals. He is using the lidocaine solution before meals, but has not been using the carafate. He reports fatigue, and naps frequently during the day. He also has some nausea at times. He reports coughing up occasional blood. This morning he reports a brighter color red blood clot coming up. He reports drinking Jevity and boost daily. 3 cans of boost and 3 cans of Jevity most days. This is his only intake. He is drinking 5-6 17 ounce bottles of water daily. He is not using his PEG tube except to flush it. He reports thick saliva, and is not using the baking soda rinses very often. His mouth is red, with several white patches, he is taking diflucan by mouth. The skin to his neck is red, and peeling. There are several areas that are redder and tender. He is using the sonafine cream as directed. I have recommended neosporin to the red areas.   BP 130/100 mmHg  Pulse 111  Temp(Src) 98 F (36.7 C)  Ht 6\' 3"  (1.905 m)  Wt 244 lb 3.2 oz (110.768 kg)  BMI 30.52 kg/m2  SpO2 100%   Orthostatics: BP sitting 127/98 pulse 104. BP standing 130/100 pulse 111.  Wt Readings from Last 3 Encounters:  03/07/16 244 lb 3.2 oz (110.768 kg)  02/29/16 249 lb 14.4 oz (113.354 kg)  02/22/16 250 lb (113.399 kg)

## 2016-03-08 ENCOUNTER — Ambulatory Visit
Admission: RE | Admit: 2016-03-08 | Discharge: 2016-03-08 | Disposition: A | Discharge status: Home / Self Care | Payer: Commercial Managed Care - HMO | Source: Ambulatory Visit | Attending: Radiation Oncology | Admitting: Radiation Oncology

## 2016-03-08 DIAGNOSIS — Z510 Encounter for antineoplastic radiation therapy: Secondary | ICD-10-CM | POA: Diagnosis not present

## 2016-03-09 ENCOUNTER — Ambulatory Visit: Payer: Commercial Managed Care - HMO | Admitting: Nutrition

## 2016-03-09 ENCOUNTER — Ambulatory Visit
Admission: RE | Admit: 2016-03-09 | Discharge: 2016-03-09 | Disposition: A | Discharge status: Home / Self Care | Payer: Commercial Managed Care - HMO | Source: Ambulatory Visit | Attending: Radiation Oncology | Admitting: Radiation Oncology

## 2016-03-09 DIAGNOSIS — Z510 Encounter for antineoplastic radiation therapy: Secondary | ICD-10-CM | POA: Diagnosis not present

## 2016-03-09 NOTE — Progress Notes (Signed)
Nutrition follow-up completed with patient after radiation therapy for tongue cancer. Patient is exclusively using his feeding tube for nutrition support. He has been consuming approximately 4 cans of Jevity 1.5 daily but reports he is planning on increasing tube feedings. Patient reports he has plenty of Jevity 1.5 and boost very high- calorie to use in his feeding tube.   He continues to drink water and flush his feeding tube with water. Patient has nausea with increased sputum. Weight has decreased and documented as 244.2 pounds March 13, down from 249.9 pounds March 6.  Estimated nutrition needs: 2600-2800 calories, 135-150 grams protein, 2.8 L fluid.  Nutrition diagnosis: Inadequate oral intake continues.  Intervention:  Patient was educated to increase Jevity 1.5-5 cans daily plus use 2 bottles boost very high-calorie to provide 2896 cal, 120 g protein, 1220 mL free water. Patient will continue to consume 1600 cc free water daily.  By mouth or via feeding tube. Teach back method was used.  Monitoring, evaluation, goals:  Patient will work to tolerate adequate tube feeding to minimize weight loss and promote healing. He will increase oral intake of foods as tolerated.  Next visit: We'll schedule follow-up appointment one month after completion of treatment.   **Disclaimer: This note was dictated with voice recognition software. Similar sounding words can inadvertently be transcribed and this note may contain transcription errors which may not have been corrected upon publication of note.**

## 2016-03-10 ENCOUNTER — Ambulatory Visit
Admission: RE | Admit: 2016-03-10 | Discharge: 2016-03-10 | Disposition: A | Discharge status: Home / Self Care | Payer: Commercial Managed Care - HMO | Source: Ambulatory Visit | Attending: Radiation Oncology | Admitting: Radiation Oncology

## 2016-03-10 DIAGNOSIS — Z510 Encounter for antineoplastic radiation therapy: Secondary | ICD-10-CM | POA: Diagnosis not present

## 2016-03-11 ENCOUNTER — Ambulatory Visit
Admission: RE | Admit: 2016-03-11 | Discharge: 2016-03-11 | Disposition: A | Discharge status: Home / Self Care | Payer: Commercial Managed Care - HMO | Source: Ambulatory Visit | Attending: Radiation Oncology | Admitting: Radiation Oncology

## 2016-03-11 ENCOUNTER — Ambulatory Visit: Payer: Commercial Managed Care - HMO

## 2016-03-11 DIAGNOSIS — Z510 Encounter for antineoplastic radiation therapy: Secondary | ICD-10-CM | POA: Diagnosis not present

## 2016-03-14 ENCOUNTER — Ambulatory Visit: Payer: Commercial Managed Care - HMO

## 2016-03-14 ENCOUNTER — Encounter: Payer: Self-pay | Admitting: *Deleted

## 2016-03-14 ENCOUNTER — Telehealth: Payer: Self-pay | Admitting: *Deleted

## 2016-03-14 ENCOUNTER — Other Ambulatory Visit: Payer: Self-pay | Admitting: Adult Health

## 2016-03-14 ENCOUNTER — Ambulatory Visit
Admission: RE | Admit: 2016-03-14 | Discharge: 2016-03-14 | Disposition: A | Discharge status: Home / Self Care | Payer: Commercial Managed Care - HMO | Source: Ambulatory Visit | Attending: Radiation Oncology | Admitting: Radiation Oncology

## 2016-03-14 ENCOUNTER — Ambulatory Visit (HOSPITAL_COMMUNITY)
Admission: RE | Admit: 2016-03-14 | Discharge: 2016-03-14 | Disposition: A | Discharge status: Home / Self Care | Payer: Commercial Managed Care - HMO | Source: Ambulatory Visit | Attending: Radiation Oncology | Admitting: Radiation Oncology

## 2016-03-14 ENCOUNTER — Other Ambulatory Visit: Payer: Self-pay | Admitting: *Deleted

## 2016-03-14 ENCOUNTER — Encounter: Payer: Self-pay | Admitting: Radiation Oncology

## 2016-03-14 VITALS — BP 140/99 | HR 116 | Temp 99.0°F | Ht 75.0 in | Wt 240.1 lb

## 2016-03-14 DIAGNOSIS — C01 Malignant neoplasm of base of tongue: Secondary | ICD-10-CM

## 2016-03-14 DIAGNOSIS — R1110 Vomiting, unspecified: Secondary | ICD-10-CM | POA: Diagnosis not present

## 2016-03-14 DIAGNOSIS — E860 Dehydration: Secondary | ICD-10-CM

## 2016-03-14 DIAGNOSIS — R112 Nausea with vomiting, unspecified: Secondary | ICD-10-CM

## 2016-03-14 LAB — BUN AND CREATININE (CC13)
BUN: 20.9 mg/dL (ref 7.0–26.0)
CREATININE: 1.6 mg/dL — AB (ref 0.7–1.3)
EGFR: 49 mL/min/{1.73_m2} — ABNORMAL LOW (ref >90)

## 2016-03-14 MED ORDER — SODIUM CHLORIDE 0.9 % IV SOLN
INTRAVENOUS | Status: AC
  Filled 2016-03-14: qty 1000

## 2016-03-14 MED ORDER — SODIUM CHLORIDE 0.9 % IV SOLN
Freq: Once | INTRAVENOUS | Status: AC
  Administered 2016-03-14: 12:00:00 via INTRAVENOUS

## 2016-03-14 MED ORDER — HYDROCODONE-ACETAMINOPHEN 7.5-325 MG PO TABS: 1.0000 | ORAL_TABLET | ORAL | Status: DC | PRN

## 2016-03-14 NOTE — Progress Notes (Addendum)
Weekly Management Note:  Outpatient    ICD-9-CM ICD-10-CM   1. Cancer of base of tongue (HCC) 141.0 C01 BUN     Basic metabolic panel     0.9 %  sodium chloride infusion     HYDROcodone-acetaminophen (NORCO) 7.5-325 MG tablet    Current Dose:  68 Gy  Projected Dose: 70 Gy   Narrative:  The patient presents for routine under treatment assessment.  CBCT/MVCT images/Port film x-rays were reviewed.  The chart was checked.   Has a cold. Vomited repeatedly last night. No diarrhea. Applying Sonafine BID    Wt Readings from Last 3 Encounters:  03/14/16 240 lb 1.6 oz (108.909 kg)  03/07/16 244 lb 3.2 oz (110.768 kg)  02/29/16 249 lb 14.4 oz (113.354 kg)    Physical Findings:  Wt Readings from Last 3 Encounters:  03/14/16 240 lb 1.6 oz (108.909 kg)  03/07/16 244 lb 3.2 oz (110.768 kg)  02/29/16 249 lb 14.4 oz (113.354 kg)   Filed Vitals:   03/14/16 0922 03/14/16 0928  BP: 138/88 140/99  Pulse: 107 116  Temp: 99 F (37.2 C)    ++ gag reflex.  Dry peeling w/ hyperpigmented skin over neck. No obvious thrush.  No bleeding over mucosa of mouth.  CBC    Component Value Date/Time   WBC 5.9 02/04/2016 0841   WBC 9.9 01/22/2016 0800   RBC 4.95 02/04/2016 0841   RBC 5.20 01/22/2016 0800   HGB 14.1 02/04/2016 0841   HGB 15.2 01/22/2016 0800   HCT 42.7 02/04/2016 0841   HCT 46.5 01/22/2016 0800   PLT 136* 02/04/2016 0841   PLT 226 01/22/2016 0800   MCV 86.3 02/04/2016 0841   MCV 89.4 01/22/2016 0800   MCH 28.5 02/04/2016 0841   MCH 29.2 01/22/2016 0800   MCHC 33.0 02/04/2016 0841   MCHC 32.7 01/22/2016 0800   RDW 12.8 02/04/2016 0841   RDW 13.6 01/22/2016 0800   LYMPHSABS 0.5* 02/04/2016 0841   LYMPHSABS 1.2 01/22/2016 0800   MONOABS 0.4 02/04/2016 0841   MONOABS 0.8 01/22/2016 0800   EOSABS 0.0 02/04/2016 0841   EOSABS 0.2 01/22/2016 0800   BASOSABS 0.0 02/04/2016 0841   BASOSABS 0.1 01/22/2016 0800     CMP     Component Value Date/Time   NA 138 02/10/2016 0933    NA 138 01/22/2016 0800   K 4.2 02/10/2016 0933   K 4.0 01/22/2016 0800   CL 104 01/22/2016 0800   CO2 27 02/10/2016 0933   CO2 25 01/22/2016 0800   GLUCOSE 101 02/10/2016 0933   GLUCOSE 114* 01/22/2016 0800   BUN 20.9 03/14/2016 1019   BUN 15 01/22/2016 0800   CREATININE 1.6* 03/14/2016 1019   CREATININE 1.01 01/22/2016 0800   CALCIUM 9.5 02/10/2016 0933   CALCIUM 9.1 01/22/2016 0800   PROT 7.2 02/10/2016 0933   PROT 7.4 01/22/2016 0800   ALBUMIN 3.5 02/10/2016 0933   ALBUMIN 3.9 01/22/2016 0800   AST 12 02/10/2016 0933   AST 18 01/22/2016 0800   ALT 18 02/10/2016 0933   ALT 19 01/22/2016 0800   ALKPHOS 84 02/10/2016 0933   ALKPHOS 89 01/22/2016 0800   BILITOT 0.49 02/10/2016 0933   BILITOT 1.0 01/22/2016 0800   GFRNONAA >60 01/22/2016 0800   GFRAA >60 01/22/2016 0800     Impression:  The patient is tolerating radiotherapy. Vomiting due to ? virus.   Labs show Cr elevation  Plan:  Continue radiotherapy as planned.  Pt drinking well this AM but will give IV fluids today.  Ordering STAT BMP for look at electrolytes.   -----------------------------------  Eppie Gibson, MD

## 2016-03-14 NOTE — Progress Notes (Signed)
Diagnosis Association: Cancer of base of tongue (HCC) (141.0  MD: S. Squire  Procedure: Pt received 1000ccs normal saline  Pt tolerated procedure.  Post procedure: Pt alert, oriented and ambulatory at discharge.

## 2016-03-14 NOTE — Progress Notes (Addendum)
Mr. Gostomski has received 34 fractions to his base of tongue nad bilateral neck.  Note mucositis in the throat region with redness of the oral mucosa.  Dry desquamation of the neck with erythema and skin remains intact.  Reports he has numerous episodes of vomiting last pm and note elevation of pulse.  He has a cold and notes green mucus when blowing his nose and when expectorating.  Temp of 99.0  Peg tube site clean and dry.  Jevity and Boost   BP 138/88 mmHg  Pulse 107  Temp(Src) 99 F (37.2 C)  Ht 6\' 3"  (1.905 m)  Wt 240 lb 1.6 oz (108.909 kg)  BMI 30.01 kg/m2  SpO2 100%- sitting  Blood pressure 140/99, pulse 116, temperature 99 F (37.2 C), height 6\' 3"  (1.905 m), weight 240 lb 1.6 oz (108.909 kg), SpO2 100 %.- standing

## 2016-03-14 NOTE — Telephone Encounter (Signed)
  Oncology Nurse Navigator Documentation  Navigator Location: [facility] (03/14/16 1552) Navigator Encounter Type: Telephone (03/14/16 1552) Telephone: Appt Confirmation/Clarification;Outgoing Call (03/14/16 1552)             Barriers/Navigation Needs: Coordination of Care (03/14/16 1552)     Per [place] guidance, called patient to inform of labs being scheduled after his final RT tomorrow morning and his appt with Elzie Rings in [place] following labs.  He voiced understanding, understands to arrive to Radiation Waiting following labs where Elzie Rings will meet him.  [name], RN, BSN, [place] [facility] [place] [phone]                        Time Spent with Patient: 15 (03/14/16 1552)

## 2016-03-15 ENCOUNTER — Ambulatory Visit (HOSPITAL_BASED_OUTPATIENT_CLINIC_OR_DEPARTMENT_OTHER): Payer: Commercial Managed Care - HMO | Admitting: Adult Health

## 2016-03-15 ENCOUNTER — Encounter: Payer: Self-pay | Admitting: Radiation Oncology

## 2016-03-15 ENCOUNTER — Other Ambulatory Visit (HOSPITAL_BASED_OUTPATIENT_CLINIC_OR_DEPARTMENT_OTHER): Payer: Commercial Managed Care - HMO

## 2016-03-15 ENCOUNTER — Ambulatory Visit
Admission: RE | Admit: 2016-03-15 | Discharge: 2016-03-15 | Disposition: A | Discharge status: Home / Self Care | Payer: Commercial Managed Care - HMO | Source: Ambulatory Visit | Attending: Radiation Oncology | Admitting: Radiation Oncology

## 2016-03-15 ENCOUNTER — Encounter: Payer: Self-pay | Admitting: Adult Health

## 2016-03-15 VITALS — BP 116/95 | HR 109 | Temp 98.8°F | Resp 18 | Wt 237.9 lb

## 2016-03-15 DIAGNOSIS — R112 Nausea with vomiting, unspecified: Secondary | ICD-10-CM

## 2016-03-15 DIAGNOSIS — E860 Dehydration: Secondary | ICD-10-CM

## 2016-03-15 DIAGNOSIS — C01 Malignant neoplasm of base of tongue: Secondary | ICD-10-CM

## 2016-03-15 DIAGNOSIS — Z510 Encounter for antineoplastic radiation therapy: Secondary | ICD-10-CM | POA: Diagnosis not present

## 2016-03-15 LAB — COMPREHENSIVE METABOLIC PANEL
ALT: 22 U/L (ref 0–55)
AST: 19 U/L (ref 5–34)
Albumin: 3.7 g/dL (ref 3.5–5.0)
Alkaline Phosphatase: 94 U/L (ref 40–150)
Anion Gap: 11 mEq/L (ref 3–11)
BUN: 24.1 mg/dL (ref 7.0–26.0)
CHLORIDE: 97 meq/L — AB (ref 98–109)
CO2: 30 meq/L — AB (ref 22–29)
CREATININE: 1.4 mg/dL — AB (ref 0.7–1.3)
Calcium: 10 mg/dL (ref 8.4–10.4)
EGFR: 54 mL/min/{1.73_m2} — ABNORMAL LOW (ref >90)
Glucose: 106 mg/dl (ref 70–140)
Potassium: 4.5 mEq/L (ref 3.5–5.1)
SODIUM: 138 meq/L (ref 136–145)
Total Bilirubin: 0.85 mg/dL (ref 0.20–1.20)
Total Protein: 7.8 g/dL (ref 6.4–8.3)

## 2016-03-15 LAB — MAGNESIUM: Magnesium: 2 mg/dl (ref 1.5–2.5)

## 2016-03-15 MED ORDER — SODIUM CHLORIDE 0.9 % IV SOLN
Freq: Once | INTRAVENOUS | Status: AC
  Administered 2016-03-15: 10:00:00 via INTRAVENOUS

## 2016-03-15 MED ORDER — SODIUM CHLORIDE 0.9 % IV SOLN
Freq: Once | INTRAVENOUS | Status: AC
  Administered 2016-03-15: 11:00:00 via INTRAVENOUS

## 2016-03-15 MED ORDER — SODIUM CHLORIDE 0.9 % IV SOLN
Freq: Once | INTRAVENOUS | Status: AC
  Administered 2016-03-15: 10:00:00 via INTRAVENOUS
  Filled 2016-03-15: qty 2

## 2016-03-15 MED ORDER — PROCHLORPERAZINE MALEATE 10 MG PO TABS: 10.0000 mg | ORAL_TABLET | Freq: Four times a day (QID) | ORAL | Status: DC | PRN

## 2016-03-15 NOTE — Progress Notes (Signed)
CLINIC:  Survivorship  REASON FOR VISIT:  Follow-up to address acute survivorship concerns.   BRIEF ONCOLOGIC HISTORY:    Cancer of base of tongue ([place])   12/02/2015 Imaging Ct neck showed right base of tongue cancer and large right neck cervical lymphadenopathy   12/10/2015 Pathology Results Accession: [id number] right tongue base and right neck mass biopsy were positive for invasive squamous cell cancer, HPV positive   12/10/2015 Procedure He underwent laryngoscopy and biopsy of base of tongue and excision of right neck mass   01/11/2016 Imaging PET scan: Hypermetabolic right tongue lesion without evidence of metastatic disease. A necrotic appearing right level-II lymph node, seen on 12/02/2015, is no longer visualized. 2. 1.8 cm low-attenuation lesion in the left lobe of the thyroid, nonspecific   01/22/2016 Surgery Pigtail gastrostomy tube and port-a-cath placed.   01/25/2016 - 03/15/2016 Radiation Therapy IMRT completed [name]).    01/27/2016 - 01/27/2016 Chemotherapy He received only 1 dose of cisplatin, complicated by uncontrolled nausea, vomiting, diarrhea and tinnitus. After much discussion, decision was made to discontinue chemotherapy permanently     INTERVAL HISTORY:  Mr. Schreier completed all scheduled fractions of his radiation therapy today.  He has been experiencing N&V for the past 2 days or so.  He reports 1 episode of vomiting in the past 24 hours.  He did not do any tube feedings yesterday because "anything I put in there came back up."  He has no pain at rest, but pain is 7-8/10 with swallowing; Hydrocodone controls his pain well; he only takes 1 pain pill per day (at bedtime).  He thinks the pain medication may also be contributing to his nausea.  He feels weak and tired. Endorses some dizziness at times. Denies headaches.   Pain: 0/10 at rest; 7-8/10 with swallowing. Only takes hydrocodone 1x/day (at night)   Nutritional Status:  -Intake/Diet: Jevity & Boost -Using a  feeding tube? Yes -Weight change: LOSS ~3 lbs in 1 day (from 03/14/16)  Swallowing:  Painful     ADDITIONAL REVIEW OF SYSTEMS:  Review of Systems  Constitutional: Positive for weight loss.       (+) "sweats"   HENT: Positive for sore throat.   Respiratory: Positive for sputum production.        "green mucus coming up"   Gastrointestinal: Positive for nausea and vomiting. Negative for diarrhea, constipation and blood in stool.  Genitourinary: Negative for dysuria and hematuria.  Neurological: Positive for dizziness and weakness. Negative for headaches.     CURRENT MEDICATIONS:  Current Outpatient Prescriptions on File Prior to Visit  Medication Sig Dispense Refill  . acetaminophen (TYLENOL) 500 MG tablet Take 500 mg by mouth every 6 (six) hours as needed.    . Biotin 5 MG/ML LIQD Take 5 mLs by mouth 2 (two) times daily.    . diazepam (VALIUM) 10 MG tablet Take 1 tablet (10 mg total) by mouth every 6 (six) hours as needed for anxiety. Reported on 02/16/2016 (Patient not taking: Reported on 03/07/2016) 30 tablet 0  . fluconazole (DIFLUCAN) 100 MG tablet Take 2 tablets today, then 1 tablet daily for 13 days thereafter. Hold Pravastatin while on this medication. 15 tablet 0  . HYDROcodone-acetaminophen (NORCO) 7.5-325 MG tablet Take 1 tablet by mouth every 3 (three) hours as needed for moderate pain. 80 tablet 0  . lidocaine (XYLOCAINE) 2 % solution Mix 1 part 2%viscous lidocaine,1part H2O.Swish and/or swallow 41m of this mixture,320m before meals and at bedtime, up to QID (Patient not  taking: Reported on 03/14/2016) 100 mL 5  . naproxen sodium (ANAPROX) 220 MG tablet Take 220 mg by mouth 2 (two) times daily with a meal. Reported on 03/07/2016    . pravastatin (PRAVACHOL) 20 MG tablet Take 20 mg by mouth daily. Reported on 03/14/2016    . sodium fluoride (FLUORISHIELD) 1.1 % GEL dental gel Instill one drop of gel per tooth space of tray. Place over teeth for 5 minutes. Remove. Spit out excess.  Repeat nightly. 120 mL prn  . sucralfate (CARAFATE) 1 g tablet Dissolve 1 tablet in 10m H2O and swallow up to QID,PRN sore throat. (Patient not taking: Reported on 03/07/2016) 60 tablet 5   No current facility-administered medications on file prior to visit.    ALLERGIES:  Allergies  Allergen Reactions  . Tetracyclines & Related Hives     PHYSICAL EXAM:  Filed Vitals:   03/15/16 0920 03/15/16 0925  BP: 132/81 116/95  Pulse: 100 109  Temp: 98.8 F (37.1 C)   Resp: 18   *Orthostatic VS as above  Weight Date  237 lb 14.4 oz (107.911 kg) 03/15/16  240 lb 1.6 oz (108.909 kg) 03/14/16  244 lb 3.2 oz (110.768 kg) 03/07/16  249 lb 14.4 oz (113.354 kg) 02/29/16  250 lb (113.399 kg) 02/22/16  252 lb 3.2 oz (114.397 kg) 02/16/16  258 lb (117.028 kg) 02/08/16  258 lb 3.2 oz (117.119 kg) 02/01/16  264 lb 9.6 oz (120.022 kg) 01/25/16  275 lb 6.4 oz (124.921 kg) 01/13/16  270 lb 9.6 oz (122.743 kg) Pre-treatment: 12/17/15   General: Pale male in mild distress d/t nausea & fatigue.  Accompanied by his mother & son today.  HEENT: Head is atraumatic and normocephalic.  Conjunctivae clear without exudate.  Sclerae anicteric.  Neck: Skin on neck with dry desquamation and hyperpigmentation. No open lesions.   Cardiovascular: Tachycardic, but regular rhythm.  Respiratory: Clear to auscultation bilaterally. Non-labored breathing.  GI: Abdomen soft and round. Mild tenderness to palpation in epigastric region. Bowel sounds normoactive. G-tube in place with dressing dry and intact. No frank drainage noted.  GU: Deferred.   Neuro: No focal deficits. Mild dizziness, but steady gait.   Psych: Normal mood and affect for situation. Extremities: No edema, cyanosis, or clubbing.   Skin: Warm and dry.    LABORATORY DATA:  CMP Latest Ref Rng 03/15/2016 03/14/2016 02/10/2016  Glucose 70 - 140 mg/dl 106 - 101  BUN 7.0 - 26.0 mg/dL 24.1 20.9 16.1  Creatinine 0.7 - 1.3 mg/dL 1.4(H) 1.6(H) 1.3  Sodium 136 - 145 mEq/L  138 - 138  Potassium 3.5 - 5.1 mEq/L 4.5 - 4.2  CO2 22 - 29 mEq/L 30(H) - 27  Calcium 8.4 - 10.4 mg/dL 10.0 - 9.5  Total Protein 6.4 - 8.3 g/dL 7.8 - 7.2  Total Bilirubin 0.20 - 1.20 mg/dL 0.85 - 0.49  Alkaline Phos 40 - 150 U/L 94 - 84  AST 5 - 34 U/L 19 - 12  ALT 0 - 55 U/L 22 - 18     Ref. Range 03/14/2016 10:19 03/15/2016 08:59  EGFR Latest Ref Range: >90 ml/min/1.73 m2 49 (L) 54 (L)     Ref. Range 03/15/2016 08:59  Magnesium Latest Ref Range: 1.5-2.5 mg/dl 2.0     DIAGNOSTIC IMAGING:  None at this visit.    ASSESSMENT & PLAN:  Mr. CLangloisis a pleasant 55y.o. male with history of Stage IVA (T1N2bM0) squamous cell carcinoma of the base of tongue, treated with 1  cycle Cisplatin (stopped due to side effects) and radiation therapy; completed treatment on 03/15/16.  Patient presents to survivorship clinic today for follow-up after finishing treatment and to address acute survivorship needs.   1. Cancer of the base of tongue:  Mr. Mazon completed definitive radiation therapy today.  He is continuing to recover from the effects of treatment.    2. Fluid volume deficit secondary to N&V & decreased intake: Mr. Sample reports that he was unable to consume any tube feedings yesterday because he was nauseated and fearful of vomiting. His BUN and CRE are elevated again today at 24.1/1.4, but there was slight improvement in his overall EGFR from the 1L NS IV bolus he received yesterday (EGFR 54 today, was 49 yesterday).  He has no history of heart failure and had orthostatic hypotension and tachycardia today in clinic.  He has also lost over 2.5 lbs in 1 day, which I suspect is likely fluid losses, so I gave him 2 L NS IVF over 3 hours today in clinic.  #22 gauge PIV started by myself and tolerated without complaints. (patient has a port-a-cath but preferred not to use it).   He tolerated the infusion without complaints and left clinic ambulatory.   3. Nausea & vomiting: He has vomited once in  the past 24 hours, which he reports is an improvement from the day before yesterday.  Unclear etiology of his N&V, although virus is certainly possible given his low-grade fever yesterday and night sweats.  The patient also feels that the Hydrocodone/APAP may be contributing to his N&V, which is certainly possible as well.  His electrolytes, including magnesium, were reviewed and are normal. No repletion of electrolytes necessary at this time.  Phosphorus lab result is pending, but I do not anticipate it to be abnormal. He reported nausea today during his visit.  I gave him Zofran 4 mg IV once during his IV fluid infusion today, which helped alleviate his nausea.  Although he is currently having bowel movements, I was concerned about the potential for constipation with his dehydration and decreased intake, so I prescribed Compazine 10 mg po Q6Hprn, #90, 1 refill.  Apparently, there are no antiemetics covered on the patient's insurance plan, so I let his mother know, who was present for the visit today, to have the pharmacy call me if there was an issue or if the prescription was too expensive and we could help with an appropriate alternative.  She voiced understanding and appreciation.   4. Pain: Continue hydrocodone/APAP as previously prescribed.   5. Weight loss: Encouraged him to restart his tube feedings, as he was able to tolerate.  I will notify Dory Peru, RD of his weight loss and recent N&V.    Dispo:  -I will review his case and provide updates to Dr. [name].   -I will consider bringing him back in to see me on Friday, 03/18/16 to reassess his symptoms and labs.  I think close follow-up until he is able to resume his tube feeds and water flushes with adequate volumes is warranted.  I will defer to Dr. Pearlie Oyster assessment and provide support for the patient's acute survivorship needs, as appropriate.  -Patient seen today by Dr. Thea Silversmith and agrees with the above plan of care.   A total of  20 minutes was spent in the care of this patient with greater than 50% of that time spent in counseling and care-coordination.  An additional 3 hours spent in observation as patient received  IV fluids in clinic today.    [name], NP [place] [phone]

## 2016-03-16 ENCOUNTER — Other Ambulatory Visit: Payer: Self-pay | Admitting: Adult Health

## 2016-03-16 ENCOUNTER — Telehealth: Payer: Self-pay | Admitting: Adult Health

## 2016-03-16 DIAGNOSIS — E860 Dehydration: Secondary | ICD-10-CM

## 2016-03-16 DIAGNOSIS — C01 Malignant neoplasm of base of tongue: Secondary | ICD-10-CM

## 2016-03-16 LAB — PHOSPHORUS: PHOSPHORUS: 3.6 mg/dL (ref 2.5–4.5)

## 2016-03-16 NOTE — Telephone Encounter (Signed)
I called and spoke with [name] Best to check on him since being seen in clinic yesterday.  He received 2L NS IVF and IV Zofran.  He tells me that he feels "pretty good" today, he just is having a lot of thick saliva.  He has not vomited in the past 24 hours. He has not had any nausea and has not needed to take any nausea medicine either.  When asked if he has been able to tolerate any tube feedings today, he tells me "so far so good" and he is planning to do smaller portions of his formula today "to take it easier."    I will see him on Friday with labs and then f/u visit with me to ensure he is keeping fluids down and doesn't need additional hydration.   [name] Craze, NP [place] [phone]

## 2016-03-17 ENCOUNTER — Ambulatory Visit: Payer: Commercial Managed Care - HMO

## 2016-03-18 ENCOUNTER — Other Ambulatory Visit (HOSPITAL_BASED_OUTPATIENT_CLINIC_OR_DEPARTMENT_OTHER): Payer: Commercial Managed Care - HMO

## 2016-03-18 ENCOUNTER — Ambulatory Visit (HOSPITAL_BASED_OUTPATIENT_CLINIC_OR_DEPARTMENT_OTHER): Payer: Self-pay | Admitting: Adult Health

## 2016-03-18 ENCOUNTER — Encounter: Payer: Self-pay | Admitting: Radiation Oncology

## 2016-03-18 ENCOUNTER — Encounter: Payer: Self-pay | Admitting: Adult Health

## 2016-03-18 VITALS — BP 137/100 | HR 117 | Temp 99.1°F | Resp 16 | Wt 236.5 lb

## 2016-03-18 DIAGNOSIS — E860 Dehydration: Secondary | ICD-10-CM

## 2016-03-18 DIAGNOSIS — C01 Malignant neoplasm of base of tongue: Secondary | ICD-10-CM

## 2016-03-18 LAB — CBC WITH DIFFERENTIAL/PLATELET
BASO%: 0.7 % (ref 0.0–2.0)
BASOS ABS: 0 10*3/uL (ref 0.0–0.1)
EOS ABS: 0.1 10*3/uL (ref 0.0–0.5)
EOS%: 1.9 % (ref 0.0–7.0)
HEMATOCRIT: 43.6 % (ref 38.4–49.9)
HEMOGLOBIN: 14.3 g/dL (ref 13.0–17.1)
LYMPH#: 0.2 10*3/uL — AB (ref 0.9–3.3)
LYMPH%: 2.6 % — ABNORMAL LOW (ref 14.0–49.0)
MCH: 28.1 pg (ref 27.2–33.4)
MCHC: 32.7 g/dL (ref 32.0–36.0)
MCV: 85.9 fL (ref 79.3–98.0)
MONO#: 0.8 10*3/uL (ref 0.1–0.9)
MONO%: 10.8 % (ref 0.0–14.0)
NEUT#: 5.9 10*3/uL (ref 1.5–6.5)
NEUT%: 84 % — AB (ref 39.0–75.0)
Platelets: 244 10*3/uL (ref 140–400)
RBC: 5.08 10*6/uL (ref 4.20–5.82)
RDW: 14.3 % (ref 11.0–14.6)
WBC: 7 10*3/uL (ref 4.0–10.3)

## 2016-03-18 LAB — COMPREHENSIVE METABOLIC PANEL
ALBUMIN: 3.6 g/dL (ref 3.5–5.0)
ALK PHOS: 91 U/L (ref 40–150)
ALT: 21 U/L (ref 0–55)
ANION GAP: 13 meq/L — AB (ref 3–11)
AST: 18 U/L (ref 5–34)
BUN: 14.5 mg/dL (ref 7.0–26.0)
CALCIUM: 10.1 mg/dL (ref 8.4–10.4)
CHLORIDE: 97 meq/L — AB (ref 98–109)
CO2: 29 mEq/L (ref 22–29)
Creatinine: 1.2 mg/dL (ref 0.7–1.3)
EGFR: 65 mL/min/{1.73_m2} — AB (ref >90)
Glucose: 95 mg/dl (ref 70–140)
POTASSIUM: 3.9 meq/L (ref 3.5–5.1)
Sodium: 140 mEq/L (ref 136–145)
Total Bilirubin: 0.74 mg/dL (ref 0.20–1.20)
Total Protein: 7.7 g/dL (ref 6.4–8.3)

## 2016-03-18 LAB — MAGNESIUM: MAGNESIUM: 2.1 mg/dL (ref 1.5–2.5)

## 2016-03-18 NOTE — Progress Notes (Signed)
CLINIC:  Survivorship  REASON FOR VISIT:  Follow-up to address acute survivorship concerns.   BRIEF ONCOLOGIC HISTORY:    Cancer of base of tongue ([place])   12/02/2015 Imaging Ct neck showed right base of tongue cancer and large right neck cervical lymphadenopathy   12/10/2015 Pathology Results Accession: [id number] right tongue base and right neck mass biopsy were positive for invasive squamous cell cancer, HPV positive   12/10/2015 Procedure He underwent laryngoscopy and biopsy of base of tongue and excision of right neck mass   01/11/2016 Imaging PET scan: Hypermetabolic right tongue lesion without evidence of metastatic disease. A necrotic appearing right level-II lymph node, seen on 12/02/2015, is no longer visualized. 2. 1.8 cm low-attenuation lesion in the left lobe of the thyroid, nonspecific   01/22/2016 Surgery Pigtail gastrostomy tube and port-a-cath placed.   01/25/2016 - 03/15/2016 Radiation Therapy IMRT completed [name]).    01/27/2016 - 01/27/2016 Chemotherapy He received only 1 dose of cisplatin, complicated by uncontrolled nausea, vomiting, diarrhea and tinnitus. After much discussion, decision was made to discontinue chemotherapy permanently     INTERVAL HISTORY:  Mr. Janoski returns to clinic today to follow-up from our previous visit earlier in the week when he received 2L NS IVF and IV Zofran for dehydration and N&V.  He tells me that he feels so much better and slept really well last night.  He has had no vomiting since his visit on Tuesday.  He has used the Compazine 2x over the past several days for nausea, which gives him complete relief.  He is able to drink water by mouth again; "I had 4 bottles of water yesterday."  He has resumed his tube feedings and his increasing the volume a little by day to get back to his goal of 5 cans/day.  He tells me that he instilled a total of 2 cans yesterday.   Pain: 0/10 at rest; Pain "getting better" with swallowing. Pain well-controlled  with Norco.    Nutritional Status:  -Intake/Diet: Jevity & Boost -Using a feeding tube? Yes -Weight change: LOSS ~1 lb since 03/15/16  Swallowing:  Painful, but improving.     ADDITIONAL REVIEW OF SYSTEMS:  Review of Systems  Constitutional: Positive for weight loss. Negative for fever and chills.       No night sweats  HENT: Positive for sore throat.   Respiratory: Positive for sputum production.        " I have a lot of thick phlegm" -Some blood-tinged sputum/phlegm  Gastrointestinal: Negative for nausea, vomiting, abdominal pain, diarrhea, constipation and blood in stool.  Genitourinary: Negative for dysuria and hematuria.  Neurological: Negative for dizziness.     CURRENT MEDICATIONS:  Current Outpatient Prescriptions on File Prior to Visit  Medication Sig Dispense Refill  . acetaminophen (TYLENOL) 500 MG tablet Take 500 mg by mouth every 6 (six) hours as needed.    . Biotin 5 MG/ML LIQD Take 5 mLs by mouth 2 (two) times daily.    . diazepam (VALIUM) 10 MG tablet Take 1 tablet (10 mg total) by mouth every 6 (six) hours as needed for anxiety. Reported on 02/16/2016 (Patient not taking: Reported on 03/07/2016) 30 tablet 0  . fluconazole (DIFLUCAN) 100 MG tablet Take 2 tablets today, then 1 tablet daily for 13 days thereafter. Hold Pravastatin while on this medication. 15 tablet 0  . HYDROcodone-acetaminophen (NORCO) 7.5-325 MG tablet Take 1 tablet by mouth every 3 (three) hours as needed for moderate pain. 80 tablet 0  .  lidocaine (XYLOCAINE) 2 % solution Mix 1 part 2%viscous lidocaine,1part H2O.Swish and/or swallow 59m of this mixture,376m before meals and at bedtime, up to QID (Patient not taking: Reported on 03/14/2016) 100 mL 5  . naproxen sodium (ANAPROX) 220 MG tablet Take 220 mg by mouth 2 (two) times daily with a meal. Reported on 03/07/2016    . pravastatin (PRAVACHOL) 20 MG tablet Take 20 mg by mouth daily. Reported on 03/14/2016    . prochlorperazine (COMPAZINE) 10 MG tablet  Take 1 tablet (10 mg total) by mouth every 6 (six) hours as needed for nausea or vomiting. 90 tablet 1  . sodium fluoride (FLUORISHIELD) 1.1 % GEL dental gel Instill one drop of gel per tooth space of tray. Place over teeth for 5 minutes. Remove. Spit out excess. Repeat nightly. 120 mL prn  . sucralfate (CARAFATE) 1 g tablet Dissolve 1 tablet in 1097m2O and swallow up to QID,PRN sore throat. (Patient not taking: Reported on 03/07/2016) 60 tablet 5   No current facility-administered medications on file prior to visit.    ALLERGIES:  Allergies  Allergen Reactions  . Tetracyclines & Related Hives     PHYSICAL EXAM:  Filed Vitals:   03/18/16 1040 03/18/16 1045  BP: 141/93 137/100  Pulse: 107 117  Temp: 99.1 F (37.3 C)   Resp: 16   *Orthostatic VS as above  Weight Date  236 lb 8 oz (107.276 kg) 03/18/16  237 lb 14.4 oz (107.911 kg) 03/15/16  240 lb 1.6 oz (108.909 kg) 03/14/16  244 lb 3.2 oz (110.768 kg) 03/07/16  249 lb 14.4 oz (113.354 kg) 02/29/16  250 lb (113.399 kg) 02/22/16  252 lb 3.2 oz (114.397 kg) 02/16/16  258 lb (117.028 kg) 02/08/16  258 lb 3.2 oz (117.119 kg) 02/01/16  264 lb 9.6 oz (120.022 kg) 01/25/16  275 lb 6.4 oz (124.921 kg) 01/13/16  270 lb 9.6 oz (122.743 kg) Pre-treatment: 12/17/15   General: Male in no acute distress. Accompanied by his mother today.  HEENT: Head is atraumatic and normocephalic.  Conjunctivae clear without exudate.  Sclerae anicteric. Mucus membranes are dry. Saliva thick/ropey.  Mucositis to soft palate and posterior oropharynx. Tongue moist without evidence of thrush.   Neck: Skin on neck with dry desquamation and hyperpigmentation. No open lesions.   Cardiovascular: Tachycardic, but regular rhythm.  Respiratory: Clear to auscultation bilaterally. Non-labored breathing.  GI: Abdomen soft and round. Bowel sounds normoactive. G-tube in place with dressing dry.  GU: Deferred.   Neuro: No focal deficits. Steady gait.  Psych: Normal mood and affect  for situation. Extremities: No edema, cyanosis, or clubbing.   Skin: Warm and dry.    LABORATORY DATA:  CMP Latest Ref Rng 03/18/2016 03/15/2016 03/14/2016  Glucose 70 - 140 mg/dl 95 106 -  BUN 7.0 - 26.0 mg/dL 14.5 24.1 20.9  Creatinine 0.7 - 1.3 mg/dL 1.2 1.4(H) 1.6(H)  Sodium 136 - 145 mEq/L 140 138 -  Potassium 3.5 - 5.1 mEq/L 3.9 4.5 -  CO2 22 - 29 mEq/L 29 30(H) -  Calcium 8.4 - 10.4 mg/dL 10.1 10.0 -  Total Protein 6.4 - 8.3 g/dL 7.7 7.8 -  Total Bilirubin 0.20 - 1.20 mg/dL 0.74 0.85 -  Alkaline Phos 40 - 150 U/L 91 94 -  AST 5 - 34 U/L 18 19 -  ALT 0 - 55 U/L 21 22 -    Ref. Range 03/14/2016 10:19 03/15/2016 08:59 03/18/2016 10:28  EGFR Latest Ref Range: >90 ml/min/1.73 m2 49 (L)  54 (L) 65 (L)     Ref. Range 03/15/2016 08:59 03/18/2016 10:28  Magnesium Latest Ref Range: 1.5-2.5 mg/dl 2.0 2.1     DIAGNOSTIC IMAGING:  None at this visit.    ASSESSMENT & PLAN:  Mr. Nutting is a pleasant 55 y.o. male with history of Stage IVA (T1N2bM0) squamous cell carcinoma of the base of tongue, treated with 1 cycle Cisplatin (stopped due to side effects) and radiation therapy; completed treatment on 03/15/16.  Patient presents to survivorship clinic today for follow-up after finishing treatment and to address acute survivorship needs.   1. Cancer of the base of tongue:  Mr. Bloom is continuing to recover from the effects of treatment.  He will see Dr. [name] for routine follow-up on 03/30/16.   2. Tachycardia likely secondary to continued mild fluid volume deficit: He is asymptomatic from the tachycardia.  His creatinine is much improved, but remains on the high-end of normal at 1.2 today. His EGFR is improved from earlier in the week as well, which is encouraging.  I instructed him to continue to push his water/fluid intake and this will likely improve in the coming weeks as he continues to heal from radiation therapy.    3. Nausea & vomiting, resolved: He has had no vomiting since his last  visit 3 days ago. He has used 2 doses of the Compazine, which has been very effective for him. He appears to have largely recovered from the ? viral GI infection. He states that he thinks that his nausea now is likely due to swallowed thick saliva, which is entirely possible.   I encouraged him to continue to use the Compazine as needed.    4. Pain: Continue hydrocodone/APAP as previously prescribed.   5. Weight loss: He has restarted his tube feeds and is titrating them back to his goal of 5 cans/day, as tolerated. He is very motivated to get the necessary nutrition he needs as he heals.  I reinforced the importance of adequate protein and calorie consumption during his recovery, as his energy expenditure to heal is at its greatest.   6. Speech therapy/Swallowing exercises: I encouraged Mr. Kynard to continue performing his swallowing exercises, now that his pain is improving.  I reinforced how critical his follow-ups with Garald Balding, SLP and performing his swallowing exercises are in his long-term recovery.  Mr. Isenberg is very motivated to get his feeding tube removed as soon as he is able.  I shared with him that he has to demonstrate a stable weight and adequate nutrition with by mouth nutrition and he has to complete his evaluations with Glendell Docker and be deemed safe to swallow/have PEG removed.  Mr. Altadonna voiced verbal understanding and plans to keep his appt with Glendell Docker scheduled for 03/30/16.   7. At risk for depression:  It is not unusual for head & neck cancer patients to experience depression in the course of their illness and subsequent recovery.  Mr. Vital is able to identify things he enjoys doing and has a very positive outlook with effective coping mechanisms.  He enjoys spending time with his 3 granddaughters, ages 12, 38, & 2.  I will certainly continue to monitor his emotional response to recovery and help ensure he has adequate resources, when appropriate.    Dispo:  -He will return to  clinic to see Dr. [name] on 03/30/16, as previously scheduled.  -He will see Garald Balding, SLP on 03/30/16 for evaluation as well.  -I shared with the patient and  his mother that I will begin seeing patients at [facility] in [place] soon and they were excited about being able to see me closer to their home at that facility for follow-ups in the future, as needed/as deemed by Dr. [name].    A total of 20 minutes was spent in the care of this patient with greater than 50% of that time spent in counseling and care-coordination.     [name], NP Survivorship Program [place] [phone]   (Coding/Billing notation: This patient is a no charge per Dr. Pablo Ledger, Medical Director of Survivorship; pt still in radiation oncology global billing period).

## 2016-03-18 NOTE — Progress Notes (Signed)
[facility] End of Treatment Note  Name:[name] Best  Date: 03/18/2016 [id number] DOB:1961-12-26   DIAGNOSIS: T1N2bM0 STAGE IVA Base of tongue squamous cell carcinoma  INDICATION FOR TREATMENT: Curative   TREATMENT DATES: 01/25/2016 to 03/15/2016                          SITE/DOSE: Base of tongue, b/l neck                          1. PTV 70 / 70 Gy in 35 fractions                          2. PTV 63 / 63 Gy in 35 fractions                          3. PTV 56 / 56 Gy in 35 fractions   BEAMS/ENERGY: 1. IMRT-Helical / 6X                                2. IMRT-Helical / 6X                                3. IMRT-Helical / 6X               NARRATIVE: The patient tolerated radiation treatment relatively well. He experienced mucositis in the throat region, dry desquamation and hyperpigmentation of skin within the treatment field, and several episodes of vomitting.   His chemotherapy was started but eventually held due to side effects.  PLAN: Routine followup in one half month. He'll receive IV fluids as needed through survivorship. Patient instructed to call if questions or worsening complaints in interim.   -----------------------------------  Eppie Gibson, MD    This document serves as a record of services personally performed by Eppie Gibson, MD. It was created on her behalf by Arlyce Harman, a trained medical scribe. The creation of this record is based on the scribe's personal observations and the provider's statements to them. This document has been checked and approved by the attending provider.

## 2016-03-18 NOTE — Progress Notes (Deleted)
[facility] End of Treatment Note  Name:[name] Best  Date: 03/18/2016 [id number] DOB:September 24, 1961   DIAGNOSIS: T1N2bM0 STAGE IVA Base of tongue squamous cell carcinoma  INDICATION FOR TREATMENT: Curative   TREATMENT DATES: 01/25/2016 to 03/15/2016                          SITE/DOSE: Base of tongue, b/l neck                          1. PTV 70 / 70 Gy in 35 fractions                          2. PTV 63 / 63 Gy in 35 fractions                          3. PTV 56 / 56 Gy in 35 fractions   BEAMS/ENERGY: 1. IMRT-Helical / 6X                                2. IMRT-Helical / 6X                                3. IMRT-Helical / 6X               NARRATIVE: The patient tolerated radiation treatment relatively well. He experienced mucositis in the throat region, dry desquamation and hyperpigmentation of skin within the treatment field, and several episodes of vomitting.   His chemotherapy was started but eventually held due to side effects.  PLAN: Routine followup in one half month. He'll receive IV fluids as needed through survivorship. Patient instructed to call if questions or worsening complaints in interim.   -----------------------------------  Eppie Gibson, MD    This document serves as a record of services personally performed by Eppie Gibson, MD. It was created on her behalf by Arlyce Harman, a trained medical scribe. The creation of this record is based on the scribe's personal observations and the provider's statements to them. This document has been checked and approved by the attending provider.

## 2016-03-18 NOTE — Patient Instructions (Signed)
-  Keep up the great work!  Continue to push your fluids and gradually increasing your tube feedings as you can tolerate.   Call me with any questions or concerns! [name], NP [place] [phone]

## 2016-03-19 NOTE — Progress Notes (Signed)
  Oncology Nurse Navigator Documentation  Navigator Location: [facility] (03/14/16 0920) Navigator Encounter Type: Treatment (03/14/16 0920)   Abnormal Finding Date: 12/02/15 (03/14/16 0920) Confirmed Diagnosis Date: 12/10/15 (03/14/16 0920)   Treatment Initiated Date: 01/25/16 (03/14/16 0920) Patient Visit Type: RadOnc (03/14/16 0920)     To provide support and encouragement, care continuity and to assess for needs, met with patient during [place] and afterwards during [place] with Dr. [name].  He reports repeated N&V beginning yesterday Sunday but no diarrhea. He acknowledged final XRT tomorrow, "I'm going to make it". Dr. [name] encouraged continued use of Trismus device.  I later learned he received IVF today, to be evaluated for additional IVF throughout the week.   [name], RN, BSN, [place] at [place] [phone]                             Time Spent with Patient: 30 (03/14/16 0920)

## 2016-03-22 NOTE — Progress Notes (Signed)
[name] Best is here for follow up of radiation completed 03/15/16 to his Base of Tongue and Bilateral Neck. [name] Best here fro reassessment s/p XRT to his base of tongue. Note only redness in the oral cavity.  He reports level 6 pain on the left when swallowing. Flakey skin and dryness noted on neck. Using Sonafine.  Feeding tube, with 3-4 cans daily.  Drinking 5 16oz bottles of water daily.   Pain issues, if any: Level 6-7 when swallowing Using a feeding tube?: Yes. Boost 3-4 cans daily Weight changes, if any:  Wt Readings from Last 3 Encounters:  03/30/16 231 lb 3.2 oz (104.872 kg)  03/18/16 236 lb 8 oz (107.276 kg)  03/15/16 237 lb 14.4 oz (107.911 kg)   Swallowing issues, if any: "getting better" Smoking or chewing tobacco? No Using fluoride trays daily? Yes Last ENT visit was on: to make an appt. Other notable issues, if any: ringing in ears has resolved

## 2016-03-25 ENCOUNTER — Telehealth: Payer: Self-pay | Admitting: *Deleted

## 2016-03-25 NOTE — Telephone Encounter (Signed)
  Oncology Nurse Navigator Documentation  Navigator Location: [facility] (03/25/16 1347) Navigator Encounter Type: Telephone (03/25/16 1347) Telephone: [name] Call (03/25/16 1347)           Treatment Phase: Post-Tx Follow-up (03/25/16 1347)       Called [name] Best to check on his well-being since completing XRT last week.  He reports:  Sore throat that is managed with PRN Norco,  He states pain has resolved significantly over the past week.  Using PEG for nutrition, instilling Boost High Protein 4 times daily per [facility] Neff's guidance.  I encouraged him to start trying to eat soft, high-protein foods such as eggs, [place] yogurt, cottage cheese.  He understands that 100% oral intake is one of the goals to be met before the PEG can be removed.  Drinking minimally 5 8 oz bottles of water daily.  Energy level has not improved.  I encouraged him to build stamina by walking outside with increasing distance, reminded him to protect treatment area from direct sun with sunscreen and wide-brimmed hat.  Denies further issues with N&V r/t thickened saliva.   He indicated he and a guest will be attending the 4/17 H&N [facility].  I encouraged him to consider attending the upcoming H&N [facility] series, discussed the benefit of this program in support of his ongoing post-treatment improvement.  He indicated availability to attend the 4/11 H&N [place] for follow-up visits with Nutrition and SLP.  He confirmed understanding of his 03/30/16 11:20 appt with Dr. [name]   He did not express any needs or concerns at this time, I encouraged him to contact me if that changes.  [name], RN, BSN, [place] at [place] [phone]                           Time Spent with Patient: 15 (03/25/16 1347)

## 2016-03-25 NOTE — Telephone Encounter (Signed)
A user error has taken place: encounter opened in error, closed for administrative reasons.

## 2016-03-30 ENCOUNTER — Encounter: Payer: Self-pay | Admitting: Radiation Oncology

## 2016-03-30 ENCOUNTER — Telehealth: Payer: Self-pay | Admitting: *Deleted

## 2016-03-30 ENCOUNTER — Ambulatory Visit
Admission: RE | Admit: 2016-03-30 | Discharge: 2016-03-30 | Disposition: A | Discharge status: Home / Self Care | Payer: Commercial Managed Care - HMO | Source: Ambulatory Visit | Attending: Radiation Oncology | Admitting: Radiation Oncology

## 2016-03-30 VITALS — BP 126/97 | HR 103 | Temp 98.7°F | Ht 75.0 in | Wt 231.2 lb

## 2016-03-30 DIAGNOSIS — C01 Malignant neoplasm of base of tongue: Secondary | ICD-10-CM

## 2016-03-30 DIAGNOSIS — R634 Abnormal weight loss: Secondary | ICD-10-CM

## 2016-03-30 MED ORDER — HYDROCODONE-ACETAMINOPHEN 7.5-325 MG PO TABS: ORAL_TABLET | ORAL | Status: DC

## 2016-03-30 NOTE — Telephone Encounter (Signed)
CALLED PATIENT TO INFORM OF PET, LAB AND FU, LVM FOR A RETURN CALL

## 2016-03-30 NOTE — Telephone Encounter (Signed)
XXXX 

## 2016-03-30 NOTE — Progress Notes (Signed)
Radiation Oncology         [phone] ________________________________  Name: [name] Best MRN: [id number]  Date: 03/30/2016  DOB: 1961/10/17  Follow-Up Visit Note  CC: [name] Kilts, MD  [name] Lark, MD  Diagnosis and Prior Radiotherapy:       ICD-9-CM ICD-10-CM   1. Cancer of base of tongue (HCC) 141.0 C01 HYDROcodone-acetaminophen (NORCO) 7.5-325 MG tablet     TSH     NM PET Image Restag (PS) Skull Base To Thigh  2. Loss of weight 783.21 R63.4 HYDROcodone-acetaminophen (NORCO) 7.5-325 MG tablet     TSH     NM PET Image Restag (PS) Skull Base To Thigh   [id number] STAGE IVA Base of tongue squamous cell carcinoma  TREATMENT DATES: 01/25/2016 to 03/15/2016                         SITE/DOSE: Base of tongue, b/l neck                          1. PTV 70 / 70 Gy in 35 fractions                          2. PTV 63 / 63 Gy in 35 fractions                          3. PTV 56 / 56 Gy in 35 fractions  Narrative:  The patient returns today for routine follow-up. [name] Best is here for follow up of radiation completed 03/15/16 to his Base of Tongue and Bilateral Neck. [name] Best here for reassessment s/p XRT to his base of tongue.  He reports level 6 pain on the left when swallowing.  Using Sonafine.  Feeding tube, with 3-4 cans daily.  Drinking 5 16oz bottles of water daily. Does not feel dizzy upon standing.   Pain issues, if any: Level 6-7 when swallowing Using a feeding tube?: Yes. Boost 3-4 cans daily Weight changes, if any:  Wt Readings from Last 3 Encounters:  03/30/16 231 lb 3.2 oz (104.872 kg)  03/18/16 236 lb 8 oz (107.276 kg)  03/15/16 237 lb 14.4 oz (107.911 kg)   Swallowing issues, if any: "getting better" Smoking or chewing tobacco? No Using fluoride trays daily? Yes Last ENT visit was on: to make an appt. Other notable issues, if any: ringing in ears has resolved                  ALLERGIES:  is allergic to tetracyclines & related.  Meds: Current Outpatient  Prescriptions  Medication Sig Dispense Refill  . acetaminophen (TYLENOL) 500 MG tablet Take 500 mg by mouth every 6 (six) hours as needed.    . Biotin 5 MG/ML LIQD Take 5 mLs by mouth 2 (two) times daily.    Marland Kitchen HYDROcodone-acetaminophen (NORCO) 7.5-325 MG tablet Take 1 tab up to TID as needed for pain. 30 tablet 0  . pravastatin (PRAVACHOL) 20 MG tablet Take 20 mg by mouth daily. Reported on 03/14/2016    . prochlorperazine (COMPAZINE) 10 MG tablet Take 1 tablet (10 mg total) by mouth every 6 (six) hours as needed for nausea or vomiting. 90 tablet 1  . sodium fluoride (FLUORISHIELD) 1.1 % GEL dental gel Instill one drop of gel per tooth space of tray. Place over teeth for 5 minutes. Remove. Spit  out excess. Repeat nightly. 120 mL prn  . diazepam (VALIUM) 10 MG tablet Take 1 tablet (10 mg total) by mouth every 6 (six) hours as needed for anxiety. Reported on 02/16/2016 (Patient not taking: Reported on 03/07/2016) 30 tablet 0  . lidocaine (XYLOCAINE) 2 % solution Mix 1 part 2%viscous lidocaine,1part H2O.Swish and/or swallow 47mL of this mixture,68min before meals and at bedtime, up to QID (Patient not taking: Reported on 03/14/2016) 100 mL 5  . sucralfate (CARAFATE) 1 g tablet Dissolve 1 tablet in 62mL H2O and swallow up to QID,PRN sore throat. (Patient not taking: Reported on 03/07/2016) 60 tablet 5   No current facility-administered medications for this encounter.    Physical Findings: The patient is in no acute distress. Patient is alert and oriented. Wt Readings from Last 3 Encounters:  03/30/16 231 lb 3.2 oz (104.872 kg)  03/18/16 236 lb 8 oz (107.276 kg)  03/15/16 237 lb 14.4 oz (107.911 kg)    height is 6\' 3"  (1.905 m) and weight is 231 lb 3.2 oz (104.872 kg). His temperature is 98.7 F (37.1 C). His blood pressure is 126/97 and his pulse is 103. His oxygen saturation is 98%. .  General: Alert and oriented, in no acute distress HEENT: Head is normocephalic. Extraocular movements are intact.  Oropharynx is notable for erythema. No thrush or sign of tumor. Mucositis resolving. Neck: Neck is notable for resolving dry desquamation; no masses  Skin: Skin in treatment area still hyperpigmented and dry Psychiatric: Judgment and insight are intact. Affect is appropriate.   Lab Findings: Lab Results  Component Value Date   WBC 7.0 03/18/2016   HGB 14.3 03/18/2016   HCT 43.6 03/18/2016   MCV 85.9 03/18/2016   PLT 244 03/18/2016    No results found for: TSH  Radiographic Findings: No results found.  Impression/Plan:    1) Head and Neck Cancer Status: healing from RT  2) Nutritional Status: lost some weight during a recent ?GI virus -- try to stabilize with eating 3-4 eggs daily, plus using PEG PEG tube:  using  3) Risk Factors: The patient has been educated about risk factors including alcohol and tobacco abuse; they understand that avoidance of alcohol and tobacco is important to prevent recurrences as well as other cancers  4) Swallowing: meeting with SLP, functional  5) Dental: Encouraged to continue regular followup with dentistry, and dental hygiene including fluoride rinses.   6) Thyroid function: No results found for: TSH  check in 3 months  7) Other: refer back to med/onc for followup in 3 weeks Refills on pain meds today as above  8) Follow-up in 3 months with PET and TSH. The patient was encouraged to call with any issues or questions before then.   _____________________________________   Eppie Gibson, MD

## 2016-03-31 ENCOUNTER — Telehealth: Payer: Self-pay | Admitting: *Deleted

## 2016-03-31 ENCOUNTER — Telehealth: Payer: Self-pay | Admitting: Adult Health

## 2016-03-31 NOTE — Telephone Encounter (Signed)
  Oncology Nurse Navigator Documentation  Navigator Location: [facility] (03/31/16 1109) Navigator Encounter Type: Telephone (03/31/16 1109) Telephone: [name] Call;Appt Confirmation/Clarification (03/31/16 1109)       Called Mr. Betten to provide him an adjusted 9:00 arrival for next Tuesday morning's  H&N MDC.  He voiced understanding.  [name], RN, BSN, [place] [facility] [place] [phone]                                     Time Spent with Patient: 15 (03/31/16 1109)

## 2016-03-31 NOTE — Telephone Encounter (Signed)
I called [name] to get his survivorship visit scheduled at the request of Dr. [name].  He will see me on 04/18/16 after he sees Dr. Enrique Sack for his post-XRT dental evaluation to try to coordinate his care/travels from [place].    I have also mailed this appt to the patient's home per his request.  He knows to call me with any questions or concerns. I look forward to seeing him soon.  [name], NP [place] [phone]

## 2016-04-01 ENCOUNTER — Telehealth: Payer: Self-pay | Admitting: *Deleted

## 2016-04-01 NOTE — Telephone Encounter (Signed)
  Oncology Nurse Navigator Documentation  Navigator Location: [facility] (04/01/16 1749) Navigator Encounter Type: Telephone (04/01/16 1749) Telephone: Jerilee Hoh Confirmation/Clarification (04/01/16 1749)       Called Mr. Wolfgramm to confirm his 0900 arrival for next Tuesday morning's H&N MDC, spoke with his mother.  She indicated she would relay reminder.  [name], RN, BSN, [place] at [place] [phone]                                     Time Spent with Patient: 15 (04/01/16 1749)

## 2016-04-05 ENCOUNTER — Ambulatory Visit: Payer: Commercial Managed Care - HMO | Admitting: Physical Therapy

## 2016-04-05 ENCOUNTER — Ambulatory Visit: Payer: Commercial Managed Care - HMO | Admitting: Nutrition

## 2016-04-05 ENCOUNTER — Encounter: Payer: Self-pay | Admitting: *Deleted

## 2016-04-05 ENCOUNTER — Ambulatory Visit: Payer: Commercial Managed Care - HMO | Attending: Radiation Oncology

## 2016-04-05 ENCOUNTER — Ambulatory Visit
Admission: RE | Admit: 2016-04-05 | Discharge: 2016-04-05 | Disposition: A | Discharge status: Home / Self Care | Payer: Commercial Managed Care - HMO | Source: Ambulatory Visit | Attending: Radiation Oncology | Admitting: Radiation Oncology

## 2016-04-05 VITALS — BP 139/99 | HR 98 | Temp 97.9°F | Ht 75.0 in | Wt 229.5 lb

## 2016-04-05 DIAGNOSIS — R1310 Dysphagia, unspecified: Secondary | ICD-10-CM | POA: Diagnosis not present

## 2016-04-05 DIAGNOSIS — C01 Malignant neoplasm of base of tongue: Secondary | ICD-10-CM

## 2016-04-05 NOTE — Patient Instructions (Signed)
Signs of Aspiration Pneumonia   . Chest pain/tightness . Fever (can be low grade) . Cough  o With foul-smelling phlegm (sputum) o With sputum containing pus or blood o With greenish sputum . Fatigue  . Shortness of breath  . Wheezing   **IF YOU HAVE THESE SIGNS, CONTACT YOUR DOCTOR OR GO TO THE EMERGENCY DEPARTMENT OR URGENT CARE AS SOON AS POSSIBLE**      

## 2016-04-05 NOTE — Progress Notes (Signed)
Nutrition follow up completed in [facility]. Patient is using 4-5 cans of Carnation VHC via tube with a minimum of 8 oz water with each feeding. Patient is beginning to eat some soft foods such as applesauce and scrambled eggs. Pepper does not agree with him. Weight documented as 229.5 pounds decreased from 244.2 pounds on March 13. Denies N,V,C, or D.  Estimated Nutrition Needs: 2600-2800 kcal, 135-150 grams protein, 2.8L fluid.  5 cans of Carnation VHC providing 2650 kcal, 110 grams protein, 2000 mL free water. Patient is also drinking additional water by mouth.  Nutrition Diagnosis: Inadequate oral intake continues.  Intervention: Educated to continue [employer], 5 cans daily, via feeding tube. Educated patient to increase oral intake by mouth and reviewed soft, moist food choices. Encouraged weight maintenance. Questions answered and teach back method used.  Monitoring, Evaluation, Goals:  Patient will tolerate TF plus oral intake to meet greater than 90% estimated nutrition needs to promote weight stabilization.  Next Visit:  Thursday, May 11, at 3:15 pm.

## 2016-04-05 NOTE — Therapy (Signed)
[facility] [address] [place], [place], [address] Phone: [phone]   Fax:  [phone]  Speech Language Pathology Treatment  Patient Details  Name: [name] Best MRN: [id number] Date of Birth: Oct 26, 1961 No Data Recorded  Encounter Date: 04/05/2016      End of Session - 04/05/16 [id number]    Visit Number 3   Number of Visits 7   Date for SLP Re-Evaluation 07/06/16   SLP Start Time 0910   SLP Stop Time  0934   SLP Time Calculation (min) 24 min   Activity Tolerance Patient tolerated treatment well      Past Medical History  Diagnosis Date  . Diverticulitis     hospitalized in 2003  . Chronic back pain   . Hyperlipidemia   . Anxiety   . PONV (postoperative nausea and vomiting)   . C. difficile diarrhea 2014    treated with Flagyl  . Cancer [facility])     Past Surgical History  Procedure Laterality Date  . Gallbladder surgery    . Left elbow    . Colonoscopy N/A 07/02/2013    [facility]:Sessile polyp (tubular adenoma) ranging between 3-73mm in size in the proximal sigmoid colon/mild diverticulosis   . Esophagogastroduodenoscopy N/A 07/02/2013    Jen[facility]:4 cm hiatal hernia/esophagus appeared normal/chronic gastritis. Clotest negative.  . Colonoscopy      Dr. Laural Golden: prior to 2003 per patient  . Knee arthroscopy with medial menisectomy Left 04/14/2015    Procedure: KNEE ARTHROSCOPY WITH MEDIAL MENISECTOMY;  Surgeon: Sanjuana Kava, MD;  Location: [facility];  Service: Orthopedics;  Laterality: Left;  . Cholecystectomy    . Direct laryngoscopy N/A 12/10/2015    Procedure: DIRECT LARYNGOSCOPY;  Surgeon: Leta Baptist, MD;  Location: [place];  Service: ENT;  Laterality: N/A;  . Mass biopsy Right 12/10/2015    Procedure: RIGHT EXCISIONAL NECK MASS BIOPSY;  Surgeon: Leta Baptist, MD;  Location: [place];  Service: ENT;  Laterality: Right;  . Tongue biopsy Right 12/10/2015    Procedure: TONGUE BIOPSY;   Surgeon: Leta Baptist, MD;  Location: [place];  Service: ENT;  Laterality: Right;    There were no vitals filed for this visit.      Subjective Assessment - 04/05/16 0914    Subjective Tried chicken broth last Saturday, no coughing.   Patient is accompained by: --  mom               ADULT SLP TREATMENT - 04/05/16 0916    General Information   Behavior/Cognition Alert;Cooperative;Pleasant mood   Treatment Provided   Treatment provided Dysphagia   Dysphagia Treatment   Temperature Spikes Noted No   Respiratory Status Room air   Treatment Methods Skilled observation;Therapeutic exercise   Patient observed directly with PO's Yes   Type of PO's observed Dysphagia 1 (puree);Thin liquids   Oral Phase Signs & Symptoms --  none noted   Pharyngeal Phase Signs & Symptoms --  none noted   Other treatment/comments SLP educated pt on benefits of a food journal when getting back to PO diet. Pt told SLP some of those benefits 15 minutes later. Pt performed HEP with occasional min A (breath hold on vocal adduction, open mouth on chin/fist (modified Shaker). With cues, pt return demonstrated WNL. He told SLP 3 s/s aspiration PNA and told SLP "keep my muscles from getting hard." for reason for completing HEP.   Pain Assessment   Pain Assessment 0-10  Pain Score 2    Pain Location lt hip   Pain Descriptors / Indicators Sore   Pain Intervention(s) Monitored during session   Assessment / Recommendations / Plan   Plan Continue with current plan of care   Dysphagia Recommendations   Diet recommendations Thin liquid  diet as tolerated   Progression Toward Goals   Progression toward goals Progressing toward goals          SLP Education - 04/05/16 0938    Education Details HEP, aspiration PNA s/s   Person(s) Educated Patient;Parent(s)   Methods Explanation;Demonstration   Comprehension Verbalized understanding;Returned demonstration          SLP Short Term Goals -  04/05/16 0941    SLP SHORT TERM GOAL #1   Title pt will perform HEP with rare min A   Status Achieved   SLP SHORT TERM GOAL #2   Title --   Time --   Period --   Status Achieved   SLP SHORT TERM GOAL #3   Title pt will tell SLP 3 s/s aspiration PNA   Time --   Period --   Status Achieved          SLP Long Term Goals - 04/05/16 0944    SLP LONG TERM GOAL #1   Title pt will tell SLP why a food journal can be beneficial for return to full, least restrictive PO diet   Status Achieved   SLP LONG TERM GOAL #2   Title pt will complete HEP independently over 3 visits   Time 5   Period --  visits   Status On-going          Plan - 04/05/16 0939    Clinical Impression Statement WNL swallowing today, indicated with dys I and thin. Pt req'd min A occasionally from SLP for procedure with HEP (HEP procedure was WNL last visit). SLP stressed why HEP as directed was necessary. Skilled ST for assessment of cont'd correct procedure of HEP as well as assess safety with PO intake.   Speech Therapy Frequency --  approx every four weeks   Duration --  4 visits   Treatment/Interventions Aspiration precaution training;Pharyngeal strengthening exercises;Oral motor exercises;Diet toleration management by SLP;Trials of upgraded texture/liquids;Compensatory techniques;Cueing hierarchy   Potential to Achieve Goals Good   SLP Home Exercise Plan provided today 01-07-16   Consulted and Agree with Plan of Care Patient      Patient will benefit from skilled therapeutic intervention in order to improve the following deficits and impairments:   Dysphagia    Problem List Patient Active Problem List   Diagnosis Date Noted  . Prerenal acute renal failure ([place]) 02/01/2016  . Tinnitus of both ears 02/01/2016  . Dehydration 01/29/2016  . Cancer of base of tongue ([place]) 12/16/2015  . Diarrhea 09/19/2013  . GERD (gastroesophageal reflux disease) 09/19/2013  . Diverticulosis 08/09/2013  . LLQ pain  08/09/2013  . Chondromalacia of left patella 08/16/2011    [facility] ,MS, CCC-SLP  04/05/2016, 9:50 AM  [facility] [address] [place], [place], [address] Phone: [phone]   Fax:  [phone]   Name: LAMONTE ROSENCRANTZ MRN: [id number] Date of Birth: [birth year]

## 2016-04-08 NOTE — Progress Notes (Signed)
  Oncology Nurse Navigator Documentation  Navigator Location: [facility] (04/05/16 5732) Navigator Encounter Type: Clinic/MDC (04/05/16 2025)       Met with Mr. Machnik during H&N [place].  He was accompanied by his mother.   Arrived him Nursing, provided verbal overview of [place] and the clinicians who he will be seeing, encouraged him to ask questions during their time with him.  He was seen by SLP and Nutrition.  Spoke with him/her at end of [facility], he denied questions/concerns.  He understands he can call me with needs/concerns.  [name], RN, BSN, [place] at [place] [phone]                                        Time Spent with Patient: 30 (04/05/16 0855)

## 2016-04-12 ENCOUNTER — Ambulatory Visit (HOSPITAL_COMMUNITY): Payer: Self-pay | Admitting: Dentistry

## 2016-04-14 ENCOUNTER — Ambulatory Visit: Payer: Commercial Managed Care - HMO

## 2016-04-14 DIAGNOSIS — R1310 Dysphagia, unspecified: Secondary | ICD-10-CM

## 2016-04-14 NOTE — Therapy (Signed)
[facility] [address] [place], [place], [address] Phone: [phone]   Fax:  [phone]  Patient Details  Name: [name] Best MRN: [id number] Date of Birth: [birth year] Referring Provider:  Sharilyn Sites, MD  Encounter Date: 04/14/2016  Pt was seen in [facility] last week. SLP touched base with pt - since eating applesauce last week at [facility] he has tried a greater variety of puree/soft foods without difficulty. Yesterday, he was drinking tube feeds and protein drinks.  SLP confirmed next appointment 05-05-16. He stated it would be idea to have PEG out near that time. SLP encouraged him to cont to eat POs, and to complete HEP as directed.   [facility] 04/14/2016, 10:23 AM  [facility] [address] [place], [place], [address] Phone: [phone]   Fax:  [phone]

## 2016-04-18 ENCOUNTER — Encounter: Payer: Self-pay | Admitting: Adult Health

## 2016-04-18 ENCOUNTER — Ambulatory Visit (HOSPITAL_BASED_OUTPATIENT_CLINIC_OR_DEPARTMENT_OTHER): Payer: Self-pay | Admitting: Adult Health

## 2016-04-18 ENCOUNTER — Ambulatory Visit (HOSPITAL_COMMUNITY): Payer: Self-pay | Admitting: Dentistry

## 2016-04-18 VITALS — BP 118/96 | HR 98 | Temp 98.4°F | Resp 14 | Wt 231.4 lb

## 2016-04-18 DIAGNOSIS — C01 Malignant neoplasm of base of tongue: Secondary | ICD-10-CM

## 2016-04-18 DIAGNOSIS — G4700 Insomnia, unspecified: Secondary | ICD-10-CM

## 2016-04-18 DIAGNOSIS — R634 Abnormal weight loss: Secondary | ICD-10-CM

## 2016-04-18 MED ORDER — DIAZEPAM 10 MG PO TABS: 10.0000 mg | ORAL_TABLET | Freq: Every evening | ORAL | Status: AC | PRN

## 2016-04-18 MED ORDER — FLUCONAZOLE 100 MG PO TABS: 100.0000 mg | ORAL_TABLET | Freq: Every day | ORAL | Status: DC

## 2016-04-18 MED ORDER — HYDROCODONE-ACETAMINOPHEN 7.5-325 MG PO TABS: ORAL_TABLET | ORAL | Status: DC

## 2016-04-18 NOTE — Progress Notes (Signed)
CLINIC:  Survivorship  REASON FOR VISIT:  Follow-up to address acute survivorship concerns.   BRIEF ONCOLOGIC HISTORY:  Oncology History   Stage IVA (T1, N2b, M0) squamous cell carcinoma of base of tongue HPV (+)     Cancer of base of tongue ([place])   12/02/2015 Imaging Ct neck showed right base of tongue cancer and large right neck cervical lymphadenopathy   12/10/2015 Pathology Results Accession: [id number] right tongue base and right neck mass biopsy were positive for invasive squamous cell cancer, HPV positive   12/10/2015 Procedure He underwent laryngoscopy and biopsy of base of tongue and excision of right neck mass   01/11/2016 Imaging PET scan: Hypermetabolic right tongue lesion without evidence of metastatic disease. A necrotic appearing right level-II lymph node, seen on 12/02/2015, is no longer visualized. 2. 1.8 cm low-attenuation lesion in the left lobe of the thyroid, nonspecific   01/22/2016 Surgery Pigtail gastrostomy tube and port-a-cath placed.   01/25/2016 - 03/15/2016 Radiation Therapy IMRT to BOT/Bilat neck completed [name] Best). Base of tongue 70 Gy in 35 fractions. High-risk neck nodal echeleons 63 Gy in 35 fractions. Intermediate-risk neck nodal echelons 56 Gy in 35 fractions   01/27/2016 - 01/27/2016 Chemotherapy He received only 1 dose of cisplatin, complicated by uncontrolled nausea, vomiting, diarrhea and tinnitus. After much discussion, decision was made to discontinue chemotherapy permanently     INTERVAL HISTORY:  [name] Best returns to clinic for routine follow-up since seeing Dr. Isidore Best about 2 weeks ago.  He tells me that he is overall doing pretty well.  He does endorse some dizziness with position changes (going from sitting to standing) and also when he has been standing for awhile.  He is drinking about 96 oz of water per day, plus some juices and caffeine-free diet coke.  He also drinks 4 high-protein Boosts per day as well.  His tongue is sore and it still hurt  when he swallows, but this is improving.  He takes about 4 Norco/day. He is having trouble sleeping; he has a history of insomnia and Valium helped in the past, but he hasn't been taking any because he doesn't think he has any left/can't find the prescription bottle.  His taste is improving; he was able to eat some vegetables with vinegar this weekend and enjoyed that.  Thursday (04/14/16) was the first time he was able to try solid foods and he tolerated it well; he is very proud of that.  He was also very excited that he was able to play some horseshoes with his friends; he is happy that his energy levels are getting better.    Pain: Pain 2-3/10; taking Norco about 4 times per day.   Nutritional Status:  -Intake/Diet: Jevity & Boost; beginning to eat some solid foods since 04/14/16.   -Using a feeding tube? Yes -Weight change: STABLE since 03/30/16  Swallowing:  Pain with swallowing is getting better; seeing [name] Best, SLP as directed and doing his swallowing exercises.     ADDITIONAL REVIEW OF SYSTEMS:  Review of Systems  Constitutional: Negative for fever and chills.       No night sweats  HENT: Positive for sore throat. Negative for ear pain.        -Sore throat improving  Cardiovascular: Negative for leg swelling.  Gastrointestinal: Negative for nausea, vomiting, abdominal pain, diarrhea, constipation and blood in stool.  Genitourinary: Negative for dysuria and hematuria.  Neurological: Positive for dizziness.       Endorses occasional dizziness with position  changes or when standing for too long; reports "drinking plenty of fluids"   Psychiatric/Behavioral: Negative for depression.     CURRENT MEDICATIONS:  Current Outpatient Prescriptions on File Prior to Visit  Medication Sig Dispense Refill  . acetaminophen (TYLENOL) 500 MG tablet Take 500 mg by mouth every 6 (six) hours as needed.    . lidocaine (XYLOCAINE) 2 % solution Mix 1 part 2%viscous lidocaine,1part H2O.Swish and/or  swallow 80mL of this mixture,20min before meals and at bedtime, up to QID (Patient not taking: Reported on 03/14/2016) 100 mL 5  . pravastatin (PRAVACHOL) 20 MG tablet Take 20 mg by mouth daily. Reported on 03/14/2016    . prochlorperazine (COMPAZINE) 10 MG tablet Take 1 tablet (10 mg total) by mouth every 6 (six) hours as needed for nausea or vomiting. 90 tablet 1  . sodium fluoride (FLUORISHIELD) 1.1 % GEL dental gel Instill one drop of gel per tooth space of tray. Place over teeth for 5 minutes. Remove. Spit out excess. Repeat nightly. 120 mL prn  . sucralfate (CARAFATE) 1 g tablet Dissolve 1 tablet in 16mL H2O and swallow up to QID,PRN sore throat. (Patient not taking: Reported on 03/07/2016) 60 tablet 5   No current facility-administered medications on file prior to visit.    ALLERGIES:  Allergies  Allergen Reactions  . Tetracyclines & Related Hives     PHYSICAL EXAM:  Filed Vitals:   04/18/16 1320 04/18/16 1325  BP: 142/82 118/96  Pulse: 80 98  Temp: 98.4 F (36.9 C)   Resp: 14   *Orthostatic VS as above  Weight Date  231 lb 6.4 oz (104.962 kg) 04/18/16  231 lb 3.2 oz (104.872 kg) 03/30/16  236 lb 8 oz (107.276 kg) 03/18/16  237 lb 14.4 oz (107.911 kg) 03/15/16  240 lb 1.6 oz (108.909 kg) 03/14/16  244 lb 3.2 oz (110.768 kg) 03/07/16  249 lb 14.4 oz (113.354 kg) 02/29/16  250 lb (113.399 kg) 02/22/16  252 lb 3.2 oz (114.397 kg) 02/16/16  258 lb (117.028 kg) 02/08/16  258 lb 3.2 oz (117.119 kg) 02/01/16  264 lb 9.6 oz (120.022 kg) 01/25/16  275 lb 6.4 oz (124.921 kg) 01/13/16  270 lb 9.6 oz (122.743 kg) Pre-treatment: 12/17/15   General: Male in no acute distress. Unaccompanied today.  HEENT: Head is atraumatic and normocephalic.  Conjunctivae clear without exudate.  Sclerae anicteric. Mucus membranes are dry. Mild thrush to posterior oropharynx. Tongue with white coating/mild thrush.  No palpable tongue or floor of mouth lesions.  Areas of healing mucositis to buccal mucosa/gums.   Bilateral TMs visualized on otoscopic exam and are grossly normal.  Lymph: No cervical, supraclavicular, or infraclavicular lymphadenopathy.  Neck: Skin on neck is dry & hyperpigmented. No open lesions or palpable masses. Mild submental lymphedema.    Cardiovascular: Regular rate and rhythm  Respiratory: Clear to auscultation bilaterally. Non-labored breathing.  GI: Abdomen soft and round. Bowel sounds normoactive. G-tube in place.  GU: Deferred.   Neuro: No focal deficits. Steady gait.  Psych: Normal mood and affect for situation. Extremities: No edema, cyanosis, or clubbing.   Skin: Warm and dry.    LABORATORY DATA:  None at this visit.   DIAGNOSTIC IMAGING:  None at this visit.    ASSESSMENT & PLAN:  [name] Best is a pleasant 55 y.o. male with history of Stage IVA (T1N2bM0) squamous cell carcinoma of the base of tongue, treated with 1 cycle Cisplatin (stopped due to side effects) and radiation therapy; completed treatment on 03/15/16.  Patient presents to survivorship clinic today for follow-up after finishing treatment and to address acute survivorship needs.   1. Cancer of the base of tongue:  [name] Best is continuing to recover from the effects of treatment.  He will see Dr. Isidore Best after he completes his restaging PET scan in 06/2016.   2. Orthostatic hypotension likely secondary to mild fluid volume deficit: He is orthostatic on exam today with SBP dropping from 140s to 110s.  He is mildly and intermittently symptomatic with dizziness.  However, he seems to be drinking quite a bit of fluid during the day and urinating adequately.  He does not feel like he is dehydrated.  His ear exam is normal, without evidence of inner ear effusion or otitis media that can often accompany dizziness.  I instructed him to continue to push his water/non-caffeinated fluid intake over the next few weeks to see if this will resolve. If it does not, then I will consider ordering blood work for further  work-up for potential autonomic dysfunction, although this seems less likely.   3. Oral candidiasis: There is evidence on physical exam of mild oral candidiasis. This may also explain part of his tongue pain symptoms as well.  I have prescribed a 2-week course of Diflucan, with a day 1 loading dose of 200 mg, for the patient. He was instructed not to take his statin medication while taking the Diflucan and voiced understanding.    4. Oral hygiene: I commended him for using his fluoride trays daily and encouraged him to continue doing those.  He understands why they are important in the setting of xerostomia and increased risk for dental caries.  Encouraged continued use of salt water/backing soda rinses, as well as brushing his tongue with a soft bristle toothbrush after meals. This will hopefully prevent any recurrent oral fungal infections.    5. Pain: He seems to be using his pain medications appropriately. He reports taking the Norco about 4 times per day (it was previously prescribed by Dr. Isidore Best to be taken TID). He does not always take the medication every day.  He states that he still has about 4-5 pills left on the prescription.  I discussed with him the importance of taking the medication as prescribed and renewed the prescription with instructions to take it QIDprn.  (Norco 7.5/325 1 tab po QIDprn; #40, no refills).  Given that he was taking the Norco different than was prescribed, it is my practice to review the [place] Controlled Substance Reporting System ([place]) database for every patient when this occurs.  I verified that his most recent prescription from Dr. Isidore Best was written on 03/30/16, filled on 04/12/16, and he was given #30, which would have been 10 days worth.  Therefore, if he has 4-5 pills left, he should be able to fill the prescription given today by 04/22/16.   6. Sleep disturbance: This has been a chronic issue for [name] Best. He states that he previously would take 2 tablets (20 mg)  of the Valium to help him sleep.  He was prescribed Diazepam most recently by Dr. Sondra Come on 02/16/16 and was given #30 (8 days supply)-this prescription was verified in the [facility] as well.  I renewed this prescription today; Diazepam 10 mg po QHSprn (#30) and gave him strict instructions not to take 2 tablets at a time.  If he felt the 10 mg was inadequate, I encouraged him to call me and let me know that I would be happy to make  changes to his medications, as clinically indicated.    7. Nutrition: His weight is stable, which is encouraging.  He should continue to try to eat more foods by mouth that he is able to tolerate. He understands that his weight has to remain stable on all oral intake before we can consider having his feeding tube removed; he is very motivated to do what needs to be done to have the tube removed.  I commended his efforts and encouraged him to keep up the good work.    8. Speech therapy/Swallowing exercises: I encouraged [name] Best to continue performing his swallowing exercises, now that his pain is improving.  I reinforced how critical his follow-ups with [name] Best, SLP and performing his swallowing exercises are in his long-term recovery.  [name] Best is very motivated to get his feeding tube removed as soon as he is able.  I shared with him that he has to demonstrate a stable weight and adequate nutrition with by mouth nutrition and he has to complete his evaluations with [name] Best and be deemed safe to swallow/have PEG removed.  We will revisit the appropriateness of getting his port-a-cath and G-tube removed in the coming weeks.     Dispo:  -He will return to clinic today see me on 05/05/16, to coordinate with his follow-up visits with both [name] Best, SLP and [name] Best, RD.  -Encouraged him to call me with any questions or concerns.    A total of 30 minutes was spent in the care of this patient with greater than 50% of that time spent in counseling and care-coordination.      [name] Craze, NP Survivorship Program [place] [phone]   (Coding/Billing notation: This patient is a no charge per Dr. Pablo Ledger, Medical Director of Survivorship; pt still in radiation oncology global billing period).

## 2016-04-18 NOTE — Patient Instructions (Addendum)
-  Start the fluconazole (Diflucan) for the thrush.  Do NOT take your cholesterol medicine (Pravastatin) while you are taking the fluconazole (Diflucan).  -Keep pushing non-caffeinated fluids -Keep trying different foods by mouth, as tolerated -Try taking just 1 Valium (10 mg) at bedtime to see if that will help you sleep better.  -I refilled your hydrocodone today as well.    -I will plan to see you back in 3.5 weeks.  -You have to be cleared by Glendell Docker and myself before we can place orders to have your feeding tube removed.   Call me with any questions or concerns! [name], NP [phone]

## 2016-05-05 ENCOUNTER — Ambulatory Visit: Payer: Commercial Managed Care - HMO | Admitting: Nutrition

## 2016-05-05 ENCOUNTER — Ambulatory Visit (HOSPITAL_BASED_OUTPATIENT_CLINIC_OR_DEPARTMENT_OTHER): Payer: Self-pay | Admitting: Adult Health

## 2016-05-05 ENCOUNTER — Encounter: Payer: Self-pay | Admitting: Adult Health

## 2016-05-05 ENCOUNTER — Ambulatory Visit: Payer: Commercial Managed Care - HMO | Attending: Radiation Oncology

## 2016-05-05 VITALS — BP 143/88 | HR 81 | Temp 98.2°F | Resp 14 | Wt 229.9 lb

## 2016-05-05 DIAGNOSIS — R1310 Dysphagia, unspecified: Secondary | ICD-10-CM | POA: Diagnosis present

## 2016-05-05 DIAGNOSIS — C01 Malignant neoplasm of base of tongue: Secondary | ICD-10-CM

## 2016-05-05 MED ORDER — HYDROCODONE-ACETAMINOPHEN 7.5-325 MG PO TABS: ORAL_TABLET | ORAL | Status: DC

## 2016-05-05 NOTE — Patient Instructions (Signed)
Make sure to open your mouth with the chin push with your fist exercise.

## 2016-05-05 NOTE — Progress Notes (Signed)
Nutrition follow-up completed with patient who has completed treatment for tongue cancer. Patient is approximately 2 months out from the end of treatment. Reports he has not used his feeding tube in over 2 weeks. He is eating a variety of foods and is tolerating a soft diet. Patient denies nausea, vomiting, constipation, and diarrhea. He reports poor appetite continues and he still has decreased taste but is eating despite this. Patient is not consuming any ensure shakes.  Weight is being maintained on food alone. Reports he is "depressed" and he still has some fatigue. Patient finds it curious he can now eat corn, popcorn, and other foods that he could not eat secondary to diverticulitis prior to treatment.  He reports these no longer bother him. There have been no recent labs.  Nutrition diagnosis: Inadequate oral intake has resolved.  Educated patient on importance of continued adequate oral intake to minimize weight loss and promote adequate protein and calories for healing. Recommended patient monitor weight at home and if his weight declines more than 5 pounds, patient should contact me. Recommend that patient can have feeding tube removed since oral intake adequate and weight is stable. No follow-up required.  Patient will contact me for questions or concerns.  **Disclaimer: This note was dictated with voice recognition software. Similar sounding words can inadvertently be transcribed and this note may contain transcription errors which may not have been corrected upon publication of note.**

## 2016-05-05 NOTE — Therapy (Signed)
[facility] [address] [place], [place], [address] Phone: [phone]   Fax:  [phone]  Speech Language Pathology Treatment  Patient Details  Name: [name] Best MRN: [id number] Date of Birth: 07-May-1961 No Data Recorded  Encounter Date: 05/05/2016      End of Session - 05/05/16 1433    Visit Number 4   Number of Visits 7   Date for SLP Re-Evaluation 07/06/16   SLP Start Time [id number]   SLP Stop Time  1430  checked goals, pt satisfied with progress   SLP Time Calculation (min) 27 min   Activity Tolerance Patient tolerated treatment well      Past Medical History  Diagnosis Date  . Diverticulitis     hospitalized in 2003  . Chronic back pain   . Hyperlipidemia   . Anxiety   . PONV (postoperative nausea and vomiting)   . C. difficile diarrhea 2014    treated with Flagyl  . Cancer [facility]     Past Surgical History  Procedure Laterality Date  . Gallbladder surgery    . Left elbow    . Colonoscopy N/A 07/02/2013    [facility]:Sessile polyp (tubular adenoma) ranging between 3-41mm in size in the proximal sigmoid colon/mild diverticulosis   . Esophagogastroduodenoscopy N/A 07/02/2013    Jenkins:4 cm hiatal hernia/esophagus appeared normal/chronic gastritis. Clotest negative.  . Colonoscopy      Dr. Laural Golden: prior to 2003 per patient  . Knee arthroscopy with medial menisectomy Left 04/14/2015    Procedure: KNEE ARTHROSCOPY WITH MEDIAL MENISECTOMY;  Surgeon: Sanjuana Kava, MD;  Location: [facility];  Service: Orthopedics;  Laterality: Left;  . Cholecystectomy    . Direct laryngoscopy N/A 12/10/2015    Procedure: DIRECT LARYNGOSCOPY;  Surgeon: Leta Baptist, MD;  Location: [facility];  Service: ENT;  Laterality: N/A;  . Mass biopsy Right 12/10/2015    Procedure: RIGHT EXCISIONAL NECK MASS BIOPSY;  Surgeon: Leta Baptist, MD;  Location: [facility];  Service: ENT;  Laterality: Right;  . Tongue biopsy Right  12/10/2015    Procedure: TONGUE BIOPSY;  Surgeon: Leta Baptist, MD;  Location: [facility];  Service: ENT;  Laterality: Right;    There were no vitals filed for this visit.      Subjective Assessment - 05/05/16 1409    Subjective "I haven't even used the tube in two weeks."   Currently in Pain? No/denies               ADULT SLP TREATMENT - 05/05/16 1410    General Information   Behavior/Cognition Alert;Cooperative;Pleasant mood   Treatment Provided   Treatment provided Dysphagia   Dysphagia Treatment   Temperature Spikes Noted No   Respiratory Status Room air   Treatment Methods Skilled observation;Therapeutic exercise   Patient observed directly with PO's Yes   Type of PO's observed Dysphagia 3 (soft);Thin liquids   Feeding Able to feed self   Liquids provided via Cup   Oral Phase Signs & Symptoms --  none noted   Pharyngeal Phase Signs & Symptoms --  none noted   Other treatment/comments Pt ate cereal bar and water today without overt s/s aspiration. Pt not doing exercises as prescribed.  He req'd min A rarely with HEP (fist push/chin resist).    Assessment / Recommendations / Plan   Plan Continue with current plan of care   Dysphagia Recommendations   Diet recommendations Regular;Thin liquid  as tolerated   Progression  Toward Goals   Progression toward goals Progressing toward goals          SLP Education - 05/05/16 1432    Education provided Yes   Education Details HEP   Person(s) Educated Patient   Methods Explanation   Comprehension Verbalized understanding;Returned demonstration          SLP Short Term Goals - 04/05/16 0941    SLP SHORT TERM GOAL #1   Title pt will perform HEP with rare min A   Status Achieved   SLP SHORT TERM GOAL #2   Title --   Time --   Period --   Status Achieved   SLP SHORT TERM GOAL #3   Title pt will tell SLP 3 s/s aspiration PNA   Time --   Period --   Status Achieved          SLP Long Term  Goals - 05/05/16 1430    SLP LONG TERM GOAL #1   Title pt will tell SLP why a food journal can be beneficial for return to full, least restrictive PO diet   Status Achieved   SLP LONG TERM GOAL #2   Title pt will complete HEP independently over 3 visits   Time 4   Period --  visits   Status On-going          Plan - 05/05/16 1433    Clinical Impression Statement WNL swallowing at this time, with dys III and thin. Pt req'd min A occasionally from SLP for procedure with HEP. SLP stressed why HEP frequency as directed was necessary. Skilled ST for assessment of cont'd correct procedure of HEP as well as assess safety with PO intake.   Speech Therapy Frequency --  approx every four weeks   Duration --  3 visits   Treatment/Interventions Aspiration precaution training;Pharyngeal strengthening exercises;Oral motor exercises;Diet toleration management by SLP;Trials of upgraded texture/liquids;Compensatory techniques;Cueing hierarchy   Potential to Achieve Goals Good   Consulted and Agree with Plan of Care Patient      Patient will benefit from skilled therapeutic intervention in order to improve the following deficits and impairments:   Dysphagia    Problem List Patient Active Problem List   Diagnosis Date Noted  . Prerenal acute renal failure ([place]) 02/01/2016  . Tinnitus of both ears 02/01/2016  . Dehydration 01/29/2016  . Cancer of base of tongue ([place]) 12/16/2015  . Diarrhea 09/19/2013  . GERD (gastroesophageal reflux disease) 09/19/2013  . Diverticulosis 08/09/2013  . LLQ pain 08/09/2013  . Chondromalacia of left patella 08/16/2011    [facility] ,MS, CCC-SLP  05/05/2016, 2:35 PM  [facility] [address] [place], [place], [address] Phone: [phone]   Fax:  [phone]   Name: [name] Best MRN: [id number] Date of Birth: 1961/01/14

## 2016-05-06 NOTE — Progress Notes (Signed)
CLINIC:  Survivorship  REASON FOR VISIT:  Follow-up to address acute survivorship concerns.   BRIEF ONCOLOGIC HISTORY:  Oncology History   Stage IVA (T1, N2b, M0) squamous cell carcinoma of base of tongue HPV (+)     Cancer of base of tongue ([place])   12/02/2015 Imaging Ct neck showed right base of tongue cancer and large right neck cervical lymphadenopathy   12/10/2015 Pathology Results Accession: [id number] right tongue base and right neck mass biopsy were positive for invasive squamous cell cancer, HPV positive   12/10/2015 Procedure He underwent laryngoscopy and biopsy of base of tongue and excision of right neck mass   01/11/2016 Imaging PET scan: Hypermetabolic right tongue lesion without evidence of metastatic disease. A necrotic appearing right level-II lymph node, seen on 12/02/2015, is no longer visualized. 2. 1.8 cm low-attenuation lesion in the left lobe of the thyroid, nonspecific   01/22/2016 Surgery Pigtail gastrostomy tube and port-a-cath placed.   01/25/2016 - 03/15/2016 Radiation Therapy IMRT to BOT/Bilat neck completed [name] Best). Base of tongue 70 Gy in 35 fractions. High-risk neck nodal echeleons 63 Gy in 35 fractions. Intermediate-risk neck nodal echelons 56 Gy in 35 fractions   01/27/2016 - 01/27/2016 Chemotherapy He received only 1 dose of cisplatin, complicated by uncontrolled nausea, vomiting, diarrhea and tinnitus. After much discussion, decision was made to discontinue chemotherapy permanently     INTERVAL HISTORY:  [name] Best returns to clinic for routine follow-up since completing treatment about 6-7 weeks ago.  He saw Raford Pitcher prior to his visit with me today and reports that he has not used his feeding tube in the past 2 weeks and has been able to tolerate a regular diet by mouth while maintaining his weight. He has no appetite, but "I just make sure I eat plenty anyway."  Has some dysgeusia, but it is improving.  He reports that he has been able to do a lot more yard  work lately and his energy levels are much improved.  He is doing his swallowing exercises regularly and has no concerns there. He is doing his fluoride trays daily as directed.  He really would like to have his feeding tube and port-a-cath removed as soon as he is able.  Endorses feeling "down in the dumps" lately and attributes much of that feeling to his current situation as a cancer survivor.    Pain: Pain 1/10 with swallowing; using Norco sparingly, but will run out soon and would like 1 last refill.   Nutritional Status:  -Intake/Diet: Regular diet; tolerating all nutrition by mouth.  -Using a feeding tube? Has a feeding tube, but has not used it in 2 weeks.   -Weight change: LOSS ~ 2 lbs since 04/18/16  Swallowing:  Pain with swallowing is getting better; seeing Garald Balding, SLP as directed and doing his swallowing exercises.     ADDITIONAL REVIEW OF SYSTEMS:  Review of Systems  Constitutional: Negative for fever, chills and malaise/fatigue.       Energy levels mostly back to normal; able to do a lot of yard work now without difficulty  HENT: Positive for sore throat. Negative for ear pain.        -Sore throat improving; using Norco "every couple of days when the pain may increase to a 5-6/10"  Cardiovascular: Negative for leg swelling.  Gastrointestinal: Negative for nausea, vomiting, abdominal pain, diarrhea, constipation and blood in stool.  Genitourinary: Negative for dysuria and hematuria.  Neurological: Negative for dizziness.  Psychiatric/Behavioral: Positive for depression.  Endorses feeling "down in the dumps" lately     CURRENT MEDICATIONS:  Current Outpatient Prescriptions on File Prior to Visit  Medication Sig Dispense Refill  . acetaminophen (TYLENOL) 500 MG tablet Take 500 mg by mouth every 6 (six) hours as needed.    . diazepam (VALIUM) 10 MG tablet Take 1 tablet (10 mg total) by mouth at bedtime as needed for anxiety or sleep. Reported on 02/16/2016 30 tablet 0    . fluconazole (DIFLUCAN) 100 MG tablet Take 1 tablet (100 mg total) by mouth daily. Take 2 tablets today (total 200 mg). Then take 1 tablet daily until all medication is gone. Do not take your Pravastatin while you are taking the Fluconazole. 15 tablet 0  . lidocaine (XYLOCAINE) 2 % solution Mix 1 part 2%viscous lidocaine,1part H2O.Swish and/or swallow 62mL of this mixture,69min before meals and at bedtime, up to QID (Patient not taking: Reported on 03/14/2016) 100 mL 5  . pravastatin (PRAVACHOL) 20 MG tablet Take 20 mg by mouth daily. Reported on 03/14/2016    . prochlorperazine (COMPAZINE) 10 MG tablet Take 1 tablet (10 mg total) by mouth every 6 (six) hours as needed for nausea or vomiting. 90 tablet 1  . sodium fluoride (FLUORISHIELD) 1.1 % GEL dental gel Instill one drop of gel per tooth space of tray. Place over teeth for 5 minutes. Remove. Spit out excess. Repeat nightly. 120 mL prn  . sucralfate (CARAFATE) 1 g tablet Dissolve 1 tablet in 38mL H2O and swallow up to QID,PRN sore throat. (Patient not taking: Reported on 03/07/2016) 60 tablet 5   No current facility-administered medications on file prior to visit.    ALLERGIES:  Allergies  Allergen Reactions  . Tetracyclines & Related Hives     PHYSICAL EXAM:  Filed Vitals:   05/05/16 1330  BP: 143/88  Pulse: 81  Temp: 98.2 F (36.8 C)  Resp: 14    Weight Date  229 lb 14.4 oz (104.282 kg) 05/05/16  231 lb 6.4 oz (104.962 kg) 04/18/16  231 lb 3.2 oz (104.872 kg) 03/30/16  236 lb 8 oz (107.276 kg) 03/18/16  237 lb 14.4 oz (107.911 kg) 03/15/16  240 lb 1.6 oz (108.909 kg) 03/14/16  244 lb 3.2 oz (110.768 kg) 03/07/16  249 lb 14.4 oz (113.354 kg) 02/29/16  250 lb (113.399 kg) 02/22/16  252 lb 3.2 oz (114.397 kg) 02/16/16  258 lb (117.028 kg) 02/08/16  258 lb 3.2 oz (117.119 kg) 02/01/16  264 lb 9.6 oz (120.022 kg) 01/25/16  275 lb 6.4 oz (124.921 kg) 01/13/16  270 lb 9.6 oz (122.743 kg) Pre-treatment: 12/17/15   General: Male in no acute  distress. Accompanied by his mother today.   HEENT: Head is atraumatic and normocephalic.  Conjunctivae clear without exudate.  Sclerae anicteric. Mucus membranes are dry. No palpable tongue or floor of mouth lesions.   Lymph: No cervical, supraclavicular, or infraclavicular lymphadenopathy.  Neck: Skin on neck is dry & hyperpigmented. No open lesions or palpable masses. Mild submental lymphedema.    Cardiovascular: Regular rate and rhythm  Respiratory: Clear to auscultation bilaterally. Non-labored breathing.  GI: Abdomen soft and round. Bowel sounds normoactive. G-tube in place.  GU: Deferred.   Neuro: No focal deficits. Steady gait.  Psych: Normal mood and affect for situation. Extremities: No edema.   Skin: Warm and dry.    LABORATORY DATA:  None at this visit.   DIAGNOSTIC IMAGING:  None at this visit.    ASSESSMENT & PLAN:  [name] Best  is a pleasant 55 y.o. male with history of Stage IVA (T1N2bM0) squamous cell carcinoma of the base of tongue, treated with 1 cycle Cisplatin (stopped due to side effects) and radiation therapy; completed treatment on 03/15/16.  Patient presents to survivorship clinic today for follow-up after finishing treatment and to address acute survivorship needs.   1. Cancer of the base of tongue:  [name] Best is continuing to recover from the effects of treatment.  He will see Dr. Isidore Best after he completes his restaging PET scan in 06/2016.   2. Nutrition/Venous access:  [name] Best weight has stabilized since completing treatment.  He has been able to tolerate all oral nutrition for the past 2 weeks and has only lost about 2 lbs.  His oral mucositis/thrush have healed.  He has been cleared by Dory Peru, RD and Garald Balding, SLP as having an adequate weight and safe/strong swallow to tolerate having the feeding tube removed.  I have also discussed with Dr. Isidore Best and she is in agreement with this plan as well. Therefore, I have placed orders for the patient to  have both his feeding tube and port-a-cath removed by Interventional Radiology.   3. Pain: He is continuing to recover and having intermittent throat pain with swallowing, requiring the use of Norco every few days.  Therefore, I have provided him with what will likely be his last prescription for pain medication; Norco 7.5/325, #30, #0 refills.    4. Oral hygiene: I commended him for using his fluoride trays daily and encouraged him to continue doing those.  He understands why they are important in the setting of xerostomia and increased risk for dental caries. He is doing well with regards to his oral hygiene.     5. Speech therapy/Swallowing exercises: I commended his efforts for his diligence in performing speech therapy and his swallowing exercises.  This likely contributed to his tremendous success in being able to resume a normal diet by mouth sooner than patients who do not participate in regular speech therapy.  I encouraged him to continue performing the exercises/therapy as directed by Garald Balding, SLP as he will be at risk for chronic dysphagia as a result of his radiation therapy.   6. Adjustment disorder with depressed mood: We spent some time today recognizing his stressors and potential contributing factors to his depressed mood.  Recovering from cancer treatments is very difficult and I provided support and validation for his concerns today.  He has wonderful family support and his daughter provides him great comfort as well.  I do not think he is clinically depressed at this time, but he is certainly at risk.  We discussed the opportunity to start antidepressant therapy and/or counseling, but he feels that this is likely situational and he will continue to improve.  I offered expressive supportive counseling for himself and his mother today.  We will certainly continue to monitor his symptoms/concerns.   Dispo:  -I will plan to see him in mid to late 05/2016 as his last visit before his  PET scan and follow-up visit with Dr. Isidore Best in 06/2016.  We may be able to coordinate his appt with me at [facility] to have port-a-cath and G-tube removed by interventional radiology. -Encouraged him to call me with any questions or concerns.    A total of 30 minutes was spent in the care of this patient with greater than 50% of that time spent in counseling and care-coordination.  [name], NP Survivorship Program [place] [phone]   (Coding/Billing notation: This patient is a no charge per Dr. Pablo Ledger, Medical Director of Survivorship; pt still in radiation oncology global billing period).

## 2016-05-12 ENCOUNTER — Other Ambulatory Visit: Payer: Self-pay | Admitting: Radiology

## 2016-05-13 ENCOUNTER — Other Ambulatory Visit: Payer: Self-pay | Admitting: Radiology

## 2016-05-16 ENCOUNTER — Ambulatory Visit (HOSPITAL_COMMUNITY)
Admission: RE | Admit: 2016-05-16 | Discharge: 2016-05-16 | Disposition: A | Discharge status: Home / Self Care | Payer: Commercial Managed Care - HMO | Source: Ambulatory Visit | Attending: Adult Health | Admitting: Adult Health

## 2016-05-16 ENCOUNTER — Encounter (HOSPITAL_COMMUNITY): Payer: Self-pay

## 2016-05-16 DIAGNOSIS — C01 Malignant neoplasm of base of tongue: Secondary | ICD-10-CM

## 2016-05-16 DIAGNOSIS — Z809 Family history of malignant neoplasm, unspecified: Secondary | ICD-10-CM | POA: Diagnosis not present

## 2016-05-16 DIAGNOSIS — F419 Anxiety disorder, unspecified: Secondary | ICD-10-CM | POA: Insufficient documentation

## 2016-05-16 DIAGNOSIS — M549 Dorsalgia, unspecified: Secondary | ICD-10-CM | POA: Insufficient documentation

## 2016-05-16 DIAGNOSIS — E785 Hyperlipidemia, unspecified: Secondary | ICD-10-CM | POA: Diagnosis not present

## 2016-05-16 DIAGNOSIS — Z79891 Long term (current) use of opiate analgesic: Secondary | ICD-10-CM | POA: Insufficient documentation

## 2016-05-16 DIAGNOSIS — Z9221 Personal history of antineoplastic chemotherapy: Secondary | ICD-10-CM | POA: Diagnosis not present

## 2016-05-16 DIAGNOSIS — Z79899 Other long term (current) drug therapy: Secondary | ICD-10-CM | POA: Insufficient documentation

## 2016-05-16 DIAGNOSIS — G8929 Other chronic pain: Secondary | ICD-10-CM | POA: Insufficient documentation

## 2016-05-16 DIAGNOSIS — Z452 Encounter for adjustment and management of vascular access device: Secondary | ICD-10-CM | POA: Diagnosis present

## 2016-05-16 DIAGNOSIS — Z431 Encounter for attention to gastrostomy: Secondary | ICD-10-CM | POA: Insufficient documentation

## 2016-05-16 DIAGNOSIS — Z923 Personal history of irradiation: Secondary | ICD-10-CM | POA: Diagnosis not present

## 2016-05-16 LAB — BASIC METABOLIC PANEL
Anion gap: 8 (ref 5–15)
BUN: 16 mg/dL (ref 6–20)
CHLORIDE: 103 mmol/L (ref 101–111)
CO2: 27 mmol/L (ref 22–32)
CREATININE: 1.16 mg/dL (ref 0.61–1.24)
Calcium: 9.4 mg/dL (ref 8.9–10.3)
GFR calc Af Amer: >60 mL/min (ref >60)
GFR calc non Af Amer: >60 mL/min (ref >60)
GLUCOSE: 101 mg/dL — AB (ref 65–99)
POTASSIUM: 3.7 mmol/L (ref 3.5–5.1)
Sodium: 138 mmol/L (ref 135–145)

## 2016-05-16 LAB — PROTIME-INR
INR: 1.07 (ref 0.00–1.49)
Prothrombin Time: 13.7 seconds (ref 11.6–15.2)

## 2016-05-16 LAB — CBC
HEMATOCRIT: 40.5 % (ref 39.0–52.0)
Hemoglobin: 13.7 g/dL (ref 13.0–17.0)
MCH: 29.5 pg (ref 26.0–34.0)
MCHC: 33.8 g/dL (ref 30.0–36.0)
MCV: 87.3 fL (ref 78.0–100.0)
Platelets: 194 10*3/uL (ref 150–400)
RBC: 4.64 MIL/uL (ref 4.22–5.81)
RDW: 13.4 % (ref 11.5–15.5)
WBC: 5.4 10*3/uL (ref 4.0–10.5)

## 2016-05-16 LAB — APTT: aPTT: 31 seconds (ref 24–37)

## 2016-05-16 MED ORDER — LIDOCAINE-EPINEPHRINE (PF) 2 %-1:200000 IJ SOLN
INTRAMUSCULAR | Status: AC
  Filled 2016-05-16: qty 20

## 2016-05-16 MED ORDER — MIDAZOLAM HCL 2 MG/2ML IJ SOLN
INTRAMUSCULAR | Status: AC | PRN
  Administered 2016-05-16 (×2): 1 mg via INTRAVENOUS

## 2016-05-16 MED ORDER — FENTANYL CITRATE (PF) 100 MCG/2ML IJ SOLN
INTRAMUSCULAR | Status: AC | PRN
  Administered 2016-05-16 (×2): 50 ug via INTRAVENOUS

## 2016-05-16 MED ORDER — LIDOCAINE HCL 1 % IJ SOLN
INTRAMUSCULAR | Status: DC
Start: 2016-05-16 — End: 2016-05-17
  Filled 2016-05-16: qty 20

## 2016-05-16 MED ORDER — FENTANYL CITRATE (PF) 100 MCG/2ML IJ SOLN
INTRAMUSCULAR | Status: AC
  Filled 2016-05-16: qty 4

## 2016-05-16 MED ORDER — MIDAZOLAM HCL 2 MG/2ML IJ SOLN
INTRAMUSCULAR | Status: AC
  Filled 2016-05-16: qty 4

## 2016-05-16 MED ORDER — LIDOCAINE VISCOUS 2 % MT SOLN
OROMUCOSAL | Status: AC
  Filled 2016-05-16: qty 15

## 2016-05-16 MED ORDER — SODIUM CHLORIDE 0.9 % IV SOLN
Freq: Once | INTRAVENOUS | Status: AC
  Administered 2016-05-16: 12:00:00 via INTRAVENOUS

## 2016-05-16 MED ORDER — LIDOCAINE-EPINEPHRINE (PF) 2 %-1:200000 IJ SOLN
INTRAMUSCULAR | Status: AC | PRN
  Administered 2016-05-16: 10 mL

## 2016-05-16 MED ORDER — CEFAZOLIN SODIUM-DEXTROSE 2-4 GM/100ML-% IV SOLN
2.0000 g | INTRAVENOUS | Status: DC
  Filled 2016-05-16: qty 100

## 2016-05-16 NOTE — H&P (Signed)
Chief Complaint: base of tongue cancer, remove PAC and PEG  Referring Physician: Dr. Eppie Gibson  Supervising Physician: Arne Cleveland  Patient Status: Out-pt  HPI: [name] Best is an 55 y.o. male who was diagnosed with base of the tongue cancer.  He had a PAC and PEG tube placed in late January.  He only used his PAC once and his PEG for about 2 weeks.  He has since done well and no longer needs either of these devices.  A request has been made for removal.  He has been feeling well otherwise today.  Past Medical History:  Past Medical History  Diagnosis Date  . Diverticulitis     hospitalized in 2003  . Chronic back pain   . Hyperlipidemia   . Anxiety   . PONV (postoperative nausea and vomiting)   . C. difficile diarrhea 2014    treated with Flagyl  . Cancer [facility])     Past Surgical History:  Past Surgical History  Procedure Laterality Date  . Gallbladder surgery    . Left elbow    . Colonoscopy N/A 07/02/2013    [facility]:Sessile polyp (tubular adenoma) ranging between 3-66m in size in the proximal sigmoid colon/mild diverticulosis   . Esophagogastroduodenoscopy N/A 07/02/2013    [facility]:4 cm hiatal hernia/esophagus appeared normal/chronic gastritis. Clotest negative.  . Colonoscopy      Dr. RLaural Golden prior to 2003 per patient  . Knee arthroscopy with medial menisectomy Left 04/14/2015    Procedure: KNEE ARTHROSCOPY WITH MEDIAL MENISECTOMY;  Surgeon: WSanjuana Kava MD;  Location: [facility];  Service: Orthopedics;  Laterality: Left;  . Cholecystectomy    . Direct laryngoscopy N/A 12/10/2015    Procedure: DIRECT LARYNGOSCOPY;  Surgeon: SLeta Baptist MD;  Location: [facility]  Service: ENT;  Laterality: N/A;  . Mass biopsy Right 12/10/2015    Procedure: RIGHT EXCISIONAL NECK MASS BIOPSY;  Surgeon: SLeta Baptist MD;  Location: [facility]  Service: ENT;  Laterality: Right;  . Tongue biopsy Right 12/10/2015    Procedure: TONGUE BIOPSY;  Surgeon: SLeta Baptist  MD;  Location: [facility]  Service: ENT;  Laterality: Right;    Family History:  Family History  Problem Relation Age of Onset  . Heart disease    . Colon cancer Neg Hx   . Colon polyps Neg Hx   . Heart failure Mother [phone] . Heart attack Father 538 . Cancer Paternal Uncle     died of cancer    Social History:  reports that he has never smoked. He has never used smokeless tobacco. He reports that he uses illicit drugs (Marijuana). He reports that he does not drink alcohol.  Allergies:  Allergies  Allergen Reactions  . Tetracyclines & Related Hives    Medications:   Medication List    ASK your doctor about these medications        acetaminophen 500 MG tablet  Commonly known as:  TYLENOL  Take 500 mg by mouth every 6 (six) hours as needed.     diazepam 10 MG tablet  Commonly known as:  VALIUM  Take 1 tablet (10 mg total) by mouth at bedtime as needed for anxiety or sleep. Reported on 02/16/2016     fluconazole 100 MG tablet  Commonly known as:  DIFLUCAN  Take 1 tablet (100 mg total) by mouth daily. Take 2 tablets today (total 200 mg). Then take 1 tablet daily until all medication is gone. Do  not take your Pravastatin while you are taking the Fluconazole.     HYDROcodone-acetaminophen 7.5-325 MG tablet  Commonly known as:  NORCO  Take 1 tab up to QID as needed for pain.     lidocaine 2 % solution  Commonly known as:  XYLOCAINE  Mix 1 part 2%viscous lidocaine,1part H2O.Swish and/or swallow 33m of this mixture,376m before meals and at bedtime, up to QID     pravastatin 20 MG tablet  Commonly known as:  PRAVACHOL  Take 20 mg by mouth daily. Reported on 03/14/2016     prochlorperazine 10 MG tablet  Commonly known as:  COMPAZINE  Take 1 tablet (10 mg total) by mouth every 6 (six) hours as needed for nausea or vomiting.     sodium fluoride 1.1 % Gel dental gel  Commonly known as:  FLUORISHIELD  Instill one drop of gel per tooth space of tray. Place over  teeth for 5 minutes. Remove. Spit out excess. Repeat nightly.     sucralfate 1 g tablet  Commonly known as:  CARAFATE  Dissolve 1 tablet in 1066m2O and swallow up to QID,PRN sore throat.        Please HPI for pertinent positives, otherwise complete 10 system ROS negative.  Mallampati Score: MD Evaluation Airway: WNL Heart: WNL Abdomen: Other (comments) Abdomen comments: PEG tube in place Chest/ Lungs: WNL ASA  Classification: 2 Mallampati/Airway Score: Two  Physical Exam: BP 129/93 mmHg  Pulse 86  Temp(Src) 98.5 F (36.9 C) (Oral)  Resp 18  SpO2 100% There is no weight on file to calculate BMI. General: pleasant, WD, WN white male who is laying in bed in NAD HEENT: head is normocephalic, atraumatic.  Sclera are noninjected.  PERRL.  Ears and nose without any masses or lesions.  Mouth is pink and moist Heart: regular, rate, and rhythm.  Normal s1,s2. No obvious murmurs, gallops, or rubs noted.  Palpable radial and pedal pulses bilaterally Lungs: CTAB, no wheezes, rhonchi, or rales noted.  Respiratory effort nonlabored.  Palpable PAC in right upper chest Abd: soft, NT, ND, +BS, no masses, hernias, or organomegaly, PEG tube in place in epigastrium Psych: A&Ox3 with an appropriate affect.   Labs: Results for orders placed or performed during the hospital encounter of 05/16/16 (from the past 48 hour(s))  APTT     Status: None   Collection Time: 05/16/16 12:00 PM  Result Value Ref Range   aPTT 31 24 - 37 seconds  Basic metabolic panel     Status: Abnormal   Collection Time: 05/16/16 12:00 PM  Result Value Ref Range   Sodium 138 135 - 145 mmol/L   Potassium 3.7 3.5 - 5.1 mmol/L   Chloride 103 101 - 111 mmol/L   CO2 27 22 - 32 mmol/L   Glucose, Bld 101 (H) 65 - 99 mg/dL   BUN 16 6 - 20 mg/dL   Creatinine, Ser 1.16 0.61 - 1.24 mg/dL   Calcium 9.4 8.9 - 10.3 mg/dL   GFR calc non Af Amer >60 >60 mL/min   GFR calc Af Amer >60 >60 mL/min    Comment: (NOTE) The eGFR has  been calculated using the CKD EPI equation. This calculation has not been validated in all clinical situations. eGFR's persistently <60 mL/min signify possible Chronic Kidney Disease.    Anion gap 8 5 - 15  CBC     Status: None   Collection Time: 05/16/16 12:00 PM  Result Value Ref Range   WBC 5.4 4.0 -  10.5 K/uL   RBC 4.64 4.22 - 5.81 MIL/uL   Hemoglobin 13.7 13.0 - 17.0 g/dL   HCT 40.5 39.0 - 52.0 %   MCV 87.3 78.0 - 100.0 fL   MCH 29.5 26.0 - 34.0 pg   MCHC 33.8 30.0 - 36.0 g/dL   RDW 13.4 11.5 - 15.5 %   Platelets 194 150 - 400 K/uL  Protime-INR     Status: None   Collection Time: 05/16/16 12:00 PM  Result Value Ref Range   Prothrombin Time 13.7 11.6 - 15.2 seconds   INR 1.07 0.00 - 1.49    Imaging: No results found.  Assessment/Plan 1. Base of tongue cancer -we will plan to remove his PAC and his PEG tube today.  He will get a dose of abx therapy prior to these procedures -the procedures have been discussed with the patient along with possible risks and complications including, but not limited to bleeding, infection, gastric fistula with delayed closure of his PEG site.  He understands all of this and is agreeable to proceed.  Thank you for this interesting consult.  I greatly enjoyed meeting [name] Best and look forward to participating in their care.  A copy of this report was sent to the requesting provider on this date.  Electronically Signed: Henreitta Cea 05/16/2016, 1:30 PM   I spent a total of    25 Minutes in face to face in clinical consultation, greater than 50% of which was counseling/coordinating care for base of tongue cancer, remove PEG and PAC

## 2016-05-16 NOTE — Discharge Instructions (Addendum)
Incision Care ° An incision (cut) is when a surgeon cuts into your body. After surgery, the cut needs to be well cared for to keep it from getting infected.  °HOW TO CARE FOR YOUR CUT °· Take medicines only as told by your doctor. °· There are many different ways to close and cover a cut, including stitches, skin glue, and adhesive strips. Follow your doctor's instructions on: °¨ Care of the cut. °¨ Bandage (dressing) changes and removal. °¨ Cut closure removal. °· Do not take baths, swim, or use a hot tub until your doctor says it is okay. You may shower as told by your doctor. °· Return to your normal diet and activities as allowed by your doctor. °· Use medicine that helps lessen itching on your cut as told by your doctor. Do not pick or scratch at your cut. °· Drink enough fluids to keep your pee (urine) clear or pale yellow. °GET HELP IF: °· You have redness, puffiness (swelling), or pain at the site of your cut. °· You have fluid, blood, or pus coming from your cut. °· Your muscles ache. °· You have chills or you feel sick. °· You have a bad smell coming from the cut or bandage. °· Your cut opens up after stitches, staples, or adhesive strips have been removed. °· You keep feeling sick to your stomach (nauseous) or keep throwing up (vomiting). °· You have a fever. °· You are dizzy. °GET HELP RIGHT AWAY IF: °· You have a rash. °· You pass out (faint). °· You have trouble breathing. °MAKE SURE YOU:  °· Understand these instructions. °· Will watch your condition. °· Will get help right away if you are not doing well or get worse. °  °This information is not intended to replace advice given to you by your health care provider. Make sure you discuss any questions you have with your health care provider. °  °Document Released: 03/05/2012 Document Revised: 01/02/2015 Document Reviewed: 02/05/2014 °Elsevier Interactive Patient Education ©2016 [employer] °Moderate Conscious Sedation, Adult, Care After °Refer to this  sheet in the next few weeks. These instructions provide you with information on caring for yourself after your procedure. Your health care provider may also give you more specific instructions. Your treatment has been planned according to current medical practices, but problems sometimes occur. Call your health care provider if you have any problems or questions after your procedure. °WHAT TO EXPECT AFTER THE PROCEDURE  °After your procedure: °· You may feel sleepy, clumsy, and have poor balance for several hours. °· Vomiting may occur if you eat too soon after the procedure. °HOME CARE INSTRUCTIONS °· Do not participate in any activities where you could become injured for at least 24 hours. Do not: °¨ Drive. °¨ Swim. °¨ Ride a bicycle. °¨ Operate heavy machinery. °¨ Cook. °¨ Use power tools. °¨ Climb ladders. °¨ Work from a high place. °· Do not make important decisions or sign legal documents until you are improved. °· If you vomit, drink water, juice, or soup when you can drink without vomiting. Make sure you have little or no nausea before eating solid foods. °· Only take over-the-counter or prescription medicines for pain, discomfort, or fever as directed by your health care provider. °· Make sure you and your family fully understand everything about the medicines given to you, including what side effects may occur. °· You should not drink alcohol, take sleeping pills, or take medicines that cause drowsiness for at least 24   hours. °· If you smoke, do not smoke without supervision. °· If you are feeling better, you may resume normal activities 24 hours after you were sedated. °· Keep all appointments with your health care provider. °SEEK MEDICAL CARE IF: °· Your skin is pale or bluish in color. °· You continue to feel nauseous or vomit. °· Your pain is getting worse and is not helped by medicine. °· You have bleeding or swelling. °· You are still sleepy or feeling clumsy after 24 hours. °SEEK IMMEDIATE MEDICAL  CARE IF: °· You develop a rash. °· You have difficulty breathing. °· You develop any type of allergic problem. °· You have a fever. °MAKE SURE YOU: °· Understand these instructions. °· Will watch your condition. °· Will get help right away if you are not doing well or get worse. °  °This information is not intended to replace advice given to you by your health care provider. Make sure you discuss any questions you have with your health care provider. °  °Document Released: 10/02/2013 Document Revised: 01/02/2015 Document Reviewed: 10/02/2013 °Elsevier Interactive Patient Education ©2016 [employer] Inc. ° °

## 2016-05-16 NOTE — Procedures (Signed)
1. R IJ Port removal 2. Gastrostomy removal No complication No blood loss. See complete dictation in [facility].

## 2016-05-27 NOTE — Progress Notes (Incomplete)
°  Radiation Oncology         [phone] ________________________________  Name: [name] Best MRN: [id number]  Date: 03/15/2016  DOB: 09-06-1961  End of Treatment Note  Diagnosis:   Curative      Indication for treatment:  Malignant Neoplasm of the base of tongue        Radiation treatment dates:   01/25/16 - 03/15/16  Site/dose:   Base of tongue, bilateral neck treated to 70 Gy in 35 fractions   Beams/energy:   IMRT- Helical / 6X  Narrative: The patient tolerated radiation treatment relatively well.     Plan: The patient has completed radiation treatment. The patient will return to radiation oncology clinic for routine followup in one month. I advised them to call or return sooner if they have any questions or concerns related to their recovery or treatment.  -----------------------------------  Eppie Gibson, MD   This document serves as a record of services personally performed by Eppie Gibson, MD. It was created on her behalf by Derek Mound, a trained medical scribe. The creation of this record is based on the scribe's personal observations and the provider's statements to them. This document has been checked and approved by the attending provider.

## 2016-06-02 ENCOUNTER — Encounter: Payer: Self-pay | Admitting: Internal Medicine

## 2016-06-13 ENCOUNTER — Telehealth: Payer: Self-pay | Admitting: Adult Health

## 2016-06-13 NOTE — Telephone Encounter (Signed)
I received a return call from Mr. Bertolet regarding how he is feeling and if he feels like he needs to be seen/evaluated before his PET scan.    He tells me that he has been very active in the past 2 weeks; he has been re-building the deck on the back of his house.  He states that overall he feels good, but thinks he may have "pushed myself too hard these past couple of weeks."  When he bends over, he occasionally has a few seconds of dizziness which passes.  He has noticed that this happens more when he hasn't eaten/drank enough or when he has been very active.  I shared that these are signs of dehydration and he should continue to push fluids, even when he feels like he is "drinking plenty", particularly during the summer months.  He said that he went to the doctor last week and weighed 220 lbs at that time (down about 9 lbs since his last visit with me on 05/05/16).  I encouraged him to continue to monitor his weight and call me if he continues to lose weight.  He had stopped the high-calorie Boost shakes and attributes much of his weight loss to this; he is planning on restarting the shakes this week.  His biggest concern is "my taste buds aren't very good."  He has found that if he eats canteloupe with his meals, he is better able to taste his foods.  He states that he feels like sometimes the food gets caught in his throat because it "gums up" in his mouth due to dry mouth.  Encouraged him to rinse his mouth with the baking soda/salt water rinses prior to meals to act as a palate cleanser and help with the xerostomia as well.  He agreed to try this.   He does not wish to be evaluated in person for these concerns at this time.  I will plan on calling the patient in about 2 weeks to check on him again via phone. He knows to call me with any additional concerning symptoms and I can see him before his next scheduled visit at the cancer center.  He voiced understanding of this plan and appreciation for the call.    [name], NP [place] [phone]

## 2016-06-13 NOTE — Telephone Encounter (Signed)
I called to speak with [name] to follow-up on how he is feeling.  He was unavailable to speak to me, but his mother tells me that he has been doing very well.  He is eating well since having his feeding tube removed.  He has been active working in the yard.  She is pleased with his continued progress.   I offered support and encouragement for what sounds like great progress in [name]'s healing.  I asked that she have him call me so that I can ask about his weight and we may decide if he feels like he needs to be seen once more before his scheduled restaging PET scan in mid-July.  If when I speak to him he is feeling very well, I will forgo his final visit and allow him to continue to progress on his own.  I am also happy to see him if he wishes.  We will see what he would like to do and I remain available to him should the need arise.  His mother stated that she would have him call me when he returned home.  Awaiting his return call.   [name] Craze, NP [place] [phone]

## 2016-07-04 ENCOUNTER — Encounter: Payer: Self-pay | Admitting: Radiation Oncology

## 2016-07-04 NOTE — Progress Notes (Signed)
[name] Best presents for follow up of radiation completed 03/15/16 to his Base of Tongue, bilateral neck.  Pain issues, if any: No Using a feeding tube?: No, removed in May.  Weight changes, if any: He believes he is maintaining his weight.  Wt Readings from Last 3 Encounters:  07/08/16 213 lb 3.2 oz (96.707 kg)  05/05/16 229 lb 14.4 oz (104.282 kg)  05/05/16 229 lb 9.6 oz (104.146 kg)   Swallowing issues, if any: He reports he is modifying his diet by eating smaller more frequent meals during the day. He is able to eat steak, if it is cut up small. He needs to have tea with meals to help his food go down better.  Smoking or chewing tobacco? No Using fluoride trays daily? yes Last ENT visit was on: Not since diagnosis.  Other notable issues, if any:  He reports his taste is improving, he can taste the first few bites of his food. He has sensitive gums when receiving a recent dental cleaning.  He "passed out" 2 days ago after working in the yard. He has an abrasion to his Left elbow.    BP 122/84 mmHg  Pulse 85  Temp(Src) 98 F (36.7 C)  Ht 6\' 3"  (1.905 m)  Wt 213 lb 3.2 oz (96.707 kg)  BMI 26.65 kg/m2  SpO2 100%

## 2016-07-07 ENCOUNTER — Ambulatory Visit
Admission: RE | Admit: 2016-07-07 | Discharge: 2016-07-07 | Disposition: A | Discharge status: Home / Self Care | Payer: Commercial Managed Care - HMO | Source: Ambulatory Visit | Attending: Radiation Oncology | Admitting: Radiation Oncology

## 2016-07-07 ENCOUNTER — Ambulatory Visit (HOSPITAL_COMMUNITY)
Admission: RE | Admit: 2016-07-07 | Discharge: 2016-07-07 | Disposition: A | Discharge status: Home / Self Care | Payer: Commercial Managed Care - HMO | Source: Ambulatory Visit | Attending: Radiation Oncology | Admitting: Radiation Oncology

## 2016-07-07 DIAGNOSIS — C01 Malignant neoplasm of base of tongue: Secondary | ICD-10-CM | POA: Insufficient documentation

## 2016-07-07 DIAGNOSIS — R634 Abnormal weight loss: Secondary | ICD-10-CM | POA: Diagnosis not present

## 2016-07-07 LAB — BASIC METABOLIC PANEL
Anion Gap: 10 mEq/L (ref 3–11)
BUN: 10.2 mg/dL (ref 7.0–26.0)
CHLORIDE: 105 meq/L (ref 98–109)
CO2: 27 meq/L (ref 22–29)
CREATININE: 1.2 mg/dL (ref 0.7–1.3)
Calcium: 9.4 mg/dL (ref 8.4–10.4)
EGFR: 70 mL/min/{1.73_m2} — ABNORMAL LOW (ref >90)
Glucose: 95 mg/dl (ref 70–140)
POTASSIUM: 4 meq/L (ref 3.5–5.1)
SODIUM: 142 meq/L (ref 136–145)

## 2016-07-07 LAB — TSH: TSH: 2.03 m[IU]/L (ref 0.320–4.118)

## 2016-07-07 LAB — GLUCOSE, CAPILLARY: Glucose-Capillary: 111 mg/dL — ABNORMAL HIGH (ref 65–99)

## 2016-07-07 MED ORDER — FLUDEOXYGLUCOSE F - 18 (FDG) INJECTION
10.3000 | Freq: Once | INTRAVENOUS | Status: AC | PRN
  Administered 2016-07-07: 10.3 via INTRAVENOUS

## 2016-07-08 ENCOUNTER — Ambulatory Visit
Admission: RE | Admit: 2016-07-08 | Discharge: 2016-07-08 | Disposition: A | Discharge status: Home / Self Care | Payer: Commercial Managed Care - HMO | Source: Ambulatory Visit | Attending: Radiation Oncology | Admitting: Radiation Oncology

## 2016-07-08 ENCOUNTER — Encounter: Payer: Self-pay | Admitting: *Deleted

## 2016-07-08 ENCOUNTER — Encounter: Payer: Self-pay | Admitting: Radiation Oncology

## 2016-07-08 VITALS — BP 122/84 | HR 85 | Temp 98.0°F | Ht 75.0 in | Wt 213.2 lb

## 2016-07-08 DIAGNOSIS — C01 Malignant neoplasm of base of tongue: Secondary | ICD-10-CM

## 2016-07-08 HISTORY — DX: Personal history of irradiation: Z92.3

## 2016-07-08 NOTE — Progress Notes (Signed)
Radiation Oncology         [phone] ________________________________  Name: [name] Best MRN: [id number]  Date: 07/08/2016  DOB: 24-May-1961  Follow-Up Visit Note  CC: [name], MD  Heath Lark, MD  Diagnosis and Prior Radiotherapy:       ICD-9-CM ICD-10-CM   1. 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 PET scan on 07/07/16 showed resolution of hypermetabolic activity in the right tonsillar region, no hypermetabolic cervical lymph nodes, and no evidence of distant metastasis (it shows a complete response to therapy).  The patient had his gastrostomy tube and port-a-cath removed on 05/16/16. He has lost 16 lbs since 05/05/16 and he has lost 24 lbs since 03/15/16. Swallowing issues, if any: He reports he is modifying his diet by eating smaller and more frequent meals during the day. He is able to eat steak if it is cut up small. He needs to have tea with meals to help his food go down better. Smoking or chewing tobacco? No Using fluoride trays daily? Yes. He reports missing his last appointment with Dr. Enrique Sack. Last ENT visit was on: Not since diagnosis.  Other notable issues, if any: He reports his taste is improving, he can taste the first few bites of his food. He has sensitive gums when receiving a recent dental cleaning.    ALLERGIES:  is allergic to tetracyclines & related.  Meds: Current Outpatient Prescriptions  Medication Sig Dispense Refill  . acetaminophen (TYLENOL) 500 MG tablet Take 500 mg by mouth every 6 (six) hours as needed.    . diazepam (VALIUM) 10 MG tablet Take 1 tablet (10 mg total) by mouth at bedtime as needed for anxiety or sleep. Reported on 02/16/2016 30 tablet  0  . HYDROcodone-acetaminophen (NORCO) 7.5-325 MG tablet Take 1 tab up to QID as needed for pain. 30 tablet 0  . pravastatin (PRAVACHOL) 20 MG tablet Take 20 mg by mouth daily. Reported on 03/14/2016    . sodium fluoride (FLUORISHIELD) 1.1 % GEL dental gel Instill one drop of gel per tooth space of tray. Place over teeth for 5 minutes. Remove. Spit out excess. Repeat nightly. 120 mL prn  . fluconazole (DIFLUCAN) 100 MG tablet Take 1 tablet (100 mg total) by mouth daily. Take 2 tablets today (total 200 mg). Then take 1 tablet daily until all medication is gone. Do not take your Pravastatin while you are taking the Fluconazole. (Patient not taking: Reported on 07/08/2016) 15 tablet 0  . lidocaine (XYLOCAINE) 2 % solution Mix 1 part 2%viscous lidocaine,1part H2O.Swish and/or swallow 15mL of this mixture,26min before meals and at bedtime, up to QID (Patient not taking: Reported on 03/14/2016) 100 mL 5  . prochlorperazine (COMPAZINE) 10 MG tablet Take 1 tablet (10 mg total) by mouth every 6 (six) hours as needed for nausea or vomiting. (Patient not taking: Reported on 07/08/2016) 90 tablet 1  . sucralfate (CARAFATE) 1 g tablet Dissolve 1 tablet in 21mL H2O and swallow up to QID,PRN sore throat. (Patient not taking: Reported on 03/07/2016) 60 tablet 5   No current facility-administered medications for this encounter.    Physical Findings:  The patient is in no acute distress. Patient is alert and oriented. Wt Readings from Last 3 Encounters:  07/08/16 213 lb 3.2 oz (96.707 kg)  05/05/16 229 lb 14.4 oz (104.282 kg)  05/05/16 229 lb 9.6 oz (104.146 kg)    height is 6\' 3"  (1.905 m) and weight is 213 lb 3.2 oz (96.707 kg). His temperature is 98 F (36.7 C). His blood pressure is 122/84 and his pulse is 85. His oxygen saturation is 100%. .  General: Alert and oriented, in no acute distress HEENT: Head is normocephalic. Extraocular movements are intact. Oropharynx is clear. Mucous membrane slightly dry. Neck:  Neck is notable for no palpable cervical or supraclavicular adenopathy. Skin: Skin in treatment fields shows satisfactory healing. Some dryness and hyperpigmentation over his neck. Heart: Regular in rate and rhythm with no murmurs, rubs, or gallops. Chest: Clear to auscultation bilaterally, with no rhonchi, wheezes, or rales. Abdomen: Soft, nontender, nondistended, with no rigidity or guarding. Extremities: No cyanosis or edema. Lymphatics: see Neck Exam Psychiatric: Judgment and insight are intact. Affect is appropriate.  Lab Findings: Lab Results  Component Value Date   WBC 5.4 05/16/2016   HGB 13.7 05/16/2016   HCT 40.5 05/16/2016   MCV 87.3 05/16/2016   PLT 194 05/16/2016    Lab Results  Component Value Date   TSH 2.030 07/07/2016    Radiographic Findings: Nm Pet Image Restag (ps) Skull Base To Thigh  07/07/2016  CLINICAL DATA:  Subsequent treatment strategy for oral pharyngeal carcinoma. EXAM: NUCLEAR MEDICINE PET SKULL BASE TO THIGH TECHNIQUE: 10.3 mCi F-18 FDG was injected intravenously. Full-ring PET imaging was performed from the skull base to thigh after the radiotracer. CT data was obtained and used for attenuation correction and anatomic localization. FASTING BLOOD GLUCOSE:  Value: 111 mg/dl COMPARISON:  PET-CT 01/11/2016 FINDINGS: NECK Resolution of the hypermetabolic activity in the RIGHT tonsillar region. No hypermetabolic cervical lymph nodes. Physiologic muscle uptake noted in the arytenoid muscles and strap muscles. CHEST No hypermetabolic mediastinal lymph nodes. No suspicious pulmonary nodules. ABDOMEN/PELVIS No abnormal hypermetabolic activity within the liver, pancreas, adrenal glands, or spleen. No hypermetabolic lymph nodes in the abdomen or pelvis. Incidental note of sigmoid diverticulosis. SKELETON No focal hypermetabolic activity to suggest skeletal metastasis. IMPRESSION: 1. Resolution of hypermetabolic activity in the RIGHT hypopharynx. 2. No hypermetabolic  cervical lymph nodes. 3. No evidence of distant metastatic disease. Electronically Signed   By: Suzy Bouchard M.D.   On: 07/07/2016 09:55    Impression/Plan:    1) Head and Neck Cancer Status: NED  2) Nutritional Status: The patient has lost 24 lbs since 03/15/16. PEG tube: The PEG tube was removed on 05/16/16.  3) Risk Factors: The patient has been educated about risk factors including alcohol and tobacco abuse; they understand that avoidance of alcohol and tobacco is important to prevent recurrences as well as other cancers  4) Swallowing: He reports he is modifying his diet by eating smaller and more frequent meals during the day. He is able to eat steak if it is cut up small. He needs to have tea with meals to help his food go down better. He is scheduled to see Garald Balding on 07/14/16.  5) Dental: Encouraged to continue regular followup with dentistry, and dental hygiene including fluoride rinses. I advised him to go back and see Dr. Enrique Sack because he missed his last appointment with him.  6) Thyroid function:  Lab Results  Component Value Date   TSH 2.030 07/07/2016  7) Other: I advised the patient to apply Vitamin E lotion to his neck. We will have him be seen in [facility] in early August. We will refer him to follow up with his ENT, Dr. Benjamine Mola, in a couple of months. We will also have him follow up with [name], NP in either November or December. Will attend [place] clinic at the next available date with PT, SLP, Nutrition, and SW present.  8) Follow-up with me  next year per referral back by [name]. The patient was encouraged to call with any issues or questions before then.   _____________________________________   Eppie Gibson, MD  This document serves as a record of services personally performed by Eppie Gibson, MD. It was created on her behalf by Darcus Austin, a trained medical scribe. The creation of this record is based on the scribe's personal observations and  the provider's statements to them. This document has been checked and approved by the attending provider.

## 2016-07-08 NOTE — Progress Notes (Signed)
  Oncology Nurse Navigator Documentation  Navigator Location: [facility] (07/08/16 1120) Navigator Encounter Type: Follow-up Appt (07/08/16 1120)           Patient Visit Type: RadOnc;Post-XRT (07/08/16 1120) Treatment Phase: Post-Tx Follow-up (07/08/16 1120)       Met with [name] during follow-up appt with Dr. [name] s/p yesterday's restaging PET.  He is 56-month post RT completion.  He reported:  New onset of gum sensitivity within last couple of days.  He noted he is conducted fluoride tmts twice daily, he understood Dr. SPearlie Oysterguidance to reduce this to daily per Dr. KRitta Slotstandard guidance.  Eating well, including meats.    Sense of taste gradually returning.  Drinking water "constantly".  Applying Sonafine daily to treatment area.  He voiced understanding of Dr. SPearlie Oystersuggestion to apply lotion with vitamin E to further assist with improved skin condition.  Conducting swallowing exercises on occasion.  I reeducated on importance of / reason for conducting twice daily until guided otherwise by CGarald Balding SLP.  He voiced understanding. He was exceedingly happy that PET indicated no evidence of disease.  He expressed sincere appreciation for the [facility]. He understands I will arrange for his [place]. He understands to call me with needs/concerns.  RGayleen Orem RN, BSN, CConcordat [phone]               Acuity: Level 1 (07/08/16 1120)         Time Spent with Patient: 45 (07/08/16 1120)

## 2016-07-14 ENCOUNTER — Ambulatory Visit: Payer: Commercial Managed Care - HMO | Attending: Radiation Oncology

## 2016-07-14 DIAGNOSIS — R1310 Dysphagia, unspecified: Secondary | ICD-10-CM | POA: Diagnosis not present

## 2016-07-14 NOTE — Therapy (Signed)
[facility] [address] [place], [place], [address] Phone: [phone]   Fax:  [phone]  Speech Language Pathology Treatment  Patient Details  Name: [name] Best MRN: [id number] Date of Birth: 02-22-1961 No Data Recorded  Encounter Date: 07/14/2016      End of Session - 07/14/16 1445    Visit Number 5   Number of Visits 7   Date for SLP Re-Evaluation 09/16/16   SLP Start Time 1404   SLP Stop Time  [id number]  pt satisfied with session time today   SLP Time Calculation (min) 29 min   Activity Tolerance Patient tolerated treatment well      Past Medical History  Diagnosis Date  . Diverticulitis     hospitalized in 2003  . Chronic back pain   . Hyperlipidemia   . Anxiety   . PONV (postoperative nausea and vomiting)   . C. difficile diarrhea 2014    treated with Flagyl  . Cancer ([place])   . Hx of radiation therapy 01/25/16- 03/15/16    Base of Tongue, bilateral neck    Past Surgical History  Procedure Laterality Date  . Gallbladder surgery    . Left elbow    . Colonoscopy N/A 07/02/2013    Jenkins:Sessile polyp (tubular adenoma) ranging between 3-42mm in size in the proximal sigmoid colon/mild diverticulosis   . Esophagogastroduodenoscopy N/A 07/02/2013    Jenkins:4 cm hiatal hernia/esophagus appeared normal/chronic gastritis. Clotest negative.  . Colonoscopy      Dr. Laural Golden: prior to 2003 per patient  . Knee arthroscopy with medial menisectomy Left 04/14/2015    Procedure: KNEE ARTHROSCOPY WITH MEDIAL MENISECTOMY;  Surgeon: Sanjuana Kava, MD;  Location: [facility];  Service: Orthopedics;  Laterality: Left;  . Cholecystectomy    . Direct laryngoscopy N/A 12/10/2015    Procedure: DIRECT LARYNGOSCOPY;  Surgeon: Leta Baptist, MD;  Location: [facility];  Service: ENT;  Laterality: N/A;  . Mass biopsy Right 12/10/2015    Procedure: RIGHT EXCISIONAL NECK MASS BIOPSY;  Surgeon: Leta Baptist, MD;  Location: [facility];  Service: ENT;  Laterality: Right;  . Tongue biopsy Right 12/10/2015    Procedure: TONGUE BIOPSY;  Surgeon: Leta Baptist, MD;  Location: [facility];  Service: ENT;  Laterality: Right;    There were no vitals filed for this visit.      Subjective Assessment - 07/14/16 1411    Subjective I can take my finger and the cold from it hurts my gums now, as of about two-three weeks ago." Still dysgeusia and xerostomia to a lesser degree.   Currently in Pain? No/denies               ADULT SLP TREATMENT - 07/14/16 1412    General Information   Behavior/Cognition Alert;Cooperative;Pleasant mood   Treatment Provided   Treatment provided Dysphagia   Dysphagia Treatment   Temperature Spikes Noted No   Treatment Methods Skilled observation;Therapeutic exercise   Patient observed directly with PO's Yes   Type of PO's observed Dysphagia 3 (soft);Thin liquids   Liquids provided via --  bottle   Oral Phase Signs & Symptoms Other (comment)  none noted   Pharyngeal Phase Signs & Symptoms Other (comment)  none noted   Other treatment/comments Pt reported completing HEP more frequently and closer to prescribed frequency. Pt told SLP all exercises, but has misplaced HEP sheet. SLP printed off another HEP for him. Pt reports he must use liquid wash  during meals. Palpation of pt's neck did not reveal any excessive fibrosis - tissue was WNL. Pt performed HEP with independence, and ate POs WNL.   Assessment / Recommendations / Plan   Plan Continue with current plan of care  dishcarge next visis?   Dysphagia Recommendations   Diet recommendations Regular;Thin liquid  as tolerated   Progression Toward Goals   Progression toward goals Progressing toward goals            SLP Short Term Goals - 04/05/16 0941    SLP SHORT TERM GOAL #1   Title pt will perform HEP with rare min A   Status Achieved   SLP SHORT TERM GOAL #2   Title --   Time --   Period --   Status  Achieved   SLP SHORT TERM GOAL #3   Title pt will tell SLP 3 s/s aspiration PNA   Time --   Period --   Status Achieved          SLP Long Term Goals - 07/14/16 1448    SLP LONG TERM GOAL #1   Title pt will tell SLP why a food journal can be beneficial for return to full, least restrictive PO diet   Status Achieved   SLP LONG TERM GOAL #2   Title pt will complete HEP independently over 3 visits   Baseline one visit 07-14-16   Time 3   Period --  visits   Status On-going          Plan - 07/14/16 1446    Clinical Impression Statement WNL swallowing at this time, with regular and thin. HEP was completed with independence. Skilled ST needed  for approx 2 more visits for assessment of cont'd correct procedure of HEP as well to as verify cont'd safety with PO intake.   Speech Therapy Frequency --  approx every four weeks   Duration --  2 visits   Treatment/Interventions Aspiration precaution training;Pharyngeal strengthening exercises;Oral motor exercises;Diet toleration management by SLP;Trials of upgraded texture/liquids;Compensatory techniques;Cueing hierarchy   Potential to Achieve Goals Good   Consulted and Agree with Plan of Care Patient      Patient will benefit from skilled therapeutic intervention in order to improve the following deficits and impairments:   Dysphagia    Problem List Patient Active Problem List   Diagnosis Date Noted  . Prerenal acute renal failure ([place]) 02/01/2016  . Tinnitus of both ears 02/01/2016  . Dehydration 01/29/2016  . Cancer of base of tongue ([place]) 12/16/2015  . Diarrhea 09/19/2013  . GERD (gastroesophageal reflux disease) 09/19/2013  . Diverticulosis 08/09/2013  . LLQ pain 08/09/2013  . Chondromalacia of left patella 08/16/2011    [facility] ,MS, CCC-SLP  07/14/2016, 2:49 PM  [facility] [address] [place], [place], [address] Phone: [phone]   Fax:   [phone]   Name: [name] Best MRN: [id number] Date of Birth: 06/01/1961

## 2016-07-14 NOTE — Patient Instructions (Signed)
Continue to complete the exercises until 09-15-16, then do them twice to three times A WEEK.

## 2016-08-09 ENCOUNTER — Ambulatory Visit (HOSPITAL_COMMUNITY): Payer: Self-pay | Admitting: Dentistry

## 2016-08-09 ENCOUNTER — Encounter (HOSPITAL_COMMUNITY): Payer: Self-pay | Admitting: Dentistry

## 2016-08-09 VITALS — BP 122/82 | HR 84 | Temp 98.2°F

## 2016-08-09 DIAGNOSIS — K117 Disturbances of salivary secretion: Secondary | ICD-10-CM

## 2016-08-09 DIAGNOSIS — R432 Parageusia: Secondary | ICD-10-CM

## 2016-08-09 DIAGNOSIS — R1310 Dysphagia, unspecified: Secondary | ICD-10-CM

## 2016-08-09 DIAGNOSIS — Z0189 Encounter for other specified special examinations: Secondary | ICD-10-CM

## 2016-08-09 DIAGNOSIS — R252 Cramp and spasm: Secondary | ICD-10-CM

## 2016-08-09 DIAGNOSIS — Z9221 Personal history of antineoplastic chemotherapy: Secondary | ICD-10-CM

## 2016-08-09 DIAGNOSIS — Z5189 Encounter for other specified aftercare: Secondary | ICD-10-CM | POA: Diagnosis not present

## 2016-08-09 DIAGNOSIS — C01 Malignant neoplasm of base of tongue: Secondary | ICD-10-CM | POA: Diagnosis not present

## 2016-08-09 DIAGNOSIS — R682 Dry mouth, unspecified: Secondary | ICD-10-CM

## 2016-08-09 DIAGNOSIS — Z923 Personal history of irradiation: Secondary | ICD-10-CM

## 2016-08-09 DIAGNOSIS — K0530 Chronic periodontitis, unspecified: Secondary | ICD-10-CM

## 2016-08-09 NOTE — Progress Notes (Signed)
08/09/2016  Patient Name:   [name] Best Date of Birth:   1961/09/24 Medical Record Number: [id number]  BP 122/82   Pulse 84   Temp 98.2 F (36.8 C)   [name] Best presents for oral examination after chemoradiation therapy. Patient completed all radiation treatments from  01/25/16 thru 03/15/16. Patient also had one dose of radiosensitizing chemotherapy. Patient had a broken appointment for his follow-up evaluation in April of 2017. The patient was seen by his primary dentist, Dr. Clemens Catholic, approximately one month ago for periodontal therapy. Patient has significant discomfort during this procedure and was only able to be treated in the upper right quadrant at that time. The patient now presents for oral examination after chemoradiation therapy.  REVIEW OF CHIEF COMPLAINTS:  DRY MOUTH: Yes HARD TO SWALLOW: Yes, at times.  HURT TO SWALLOW: No TASTE CHANGES: Taste is at "20%" of his original taste. SORES IN MOUTH: No TRISMUS: No problems opening and closing but has decreased opening.  WEIGHT: 198 lbs  HOME OH REGIMEN:  BRUSHING: Three times daily and at bedtime FLOSSING: Using fluoride and trays at night RINSING: Warm Salt and regular tepid water. Biotene and other mouthwashes cause sensitivity. FLUORIDE: Yes, at bedtime in his trays. TRISMUS EXERCISES:  Maximum interincisal opening: 25 mm.  Doing exercises. Consider Physical therapy for decreased opening.   DENTAL EXAM:  Oral Hygiene:(PLAQUE): Good. LOCATION OF MUCOSITIS: None noted. DESCRIPTION OF SALIVA: Decreased and foamy saliva. Moderate xerostomia ANY EXPOSED BONE: None noted. OTHER WATCHED AREAS: Previous extraction site #31. Tooth #10 with 1+ mobility. Mandibular lingual gingival recession and risk for caries.  DX:  1. Xerostomia 2. Dysgeusia 3. Dysphagia 4. Trismus with decreased maximum interincisal opening now measured at 25 mm down from the original 46 mm. 5. Tooth mobility-especially tooth #10 with  a class I+ mobility noted today.  RECOMMENDATIONS: 1. Brush after meals and at bedtime.  Use fluoride at bedtime. 2. Use trismus exercises as directed. Patient currently does not wish to be referred to physical therapy at this time. Patient is to contact Dr. [name] for that referral if needed.  3. Use salt water/baking soda rinses. 4. Multiple sips of water as needed. 5. Return to Dr. Essie Hart for periodontal therapy.  Dr. Essie Hart to discuss overall prognosis of tooth #10. 6. Continue follow up with Dr. Seward Grater for evaluation for restoration of lingual surfaces of the mandibular anterior teeth as needed.   Lenn Cal, DDS

## 2016-08-09 NOTE — Patient Instructions (Addendum)
RECOMMENDATIONS: 1. Brush after meals and at bedtime.  Use fluoride at bedtime. 2. Use trismus exercises as directed. Patient currently does not wish to be referred to physical therapy at this time. Patient is to contact Dr. [name] for that referral if needed.  3. Use salt water/baking soda rinses. 4. Multiple sips of water as needed. 5. Return to Dr. Essie Hart for periodontal therapy.  Dr. Essie Hart to discuss overall prognosis of tooth #10. 6. Continue follow up with Dr. Seward Grater for evaluation for restoration of lingual surfaces of the mandibular anterior teeth as needed.   Lenn Cal, DDS    RADIATION THERAPY AND DECISIONS REGARDING YOUR TEETH  Xerostomia (dry mouth) Your salivary glands may be in the filed of radiation.  Radiation may include all or part of your saliva glands.  This will cause your saliva to dry up and you will have a dry mouth.  The dry mouth will be for the rest of your life unless your radiation oncologist tells you otherwise.  Your saliva has many functions:  Saliva wets your tongue for speaking.  It coats your teeth and the inside of your mouth for easier movement.  It helps with chewing and swallowing food.  It helps clean away harmful acid and toxic products made by the germs in your mouth, therefore it helps prevent cavities.  It kills some germs in your mouth and helps to prevent gum disease.  It helps to carry flavor to your taste buds.  Once you have lost your saliva you will be at higher risk for tooth decay and gum disease.  What can be done to help improve your mouth when there's not enough saliva:  1.  Your dentist may give a prescription for Salagen.  It will not bring back all of your saliva but may bring back some of it.  Also your saliva may be thick and ropy or white and foamy. It will not feel like it use to feel.  2.  You will need to swish with water every time your mouth feels dry.  YOU CANNOT suck on any cough drops, mints, lemon  drops, candy, vitamin C or any other products.  You cannot use anything other than water to make your mouth feel less dry.  If you want to drink anything else you have to drink it all at once and brush afterwards.  Be sure to discuss the details of your diet habits with your dentist or hygienist.  Radiation caries: This is decay that happens very quickly once your mouth is very dry due to radiation therapy.  Normally cavities take six months to two years to become a problem.  When you have dry mouth cavities may take as little as eight weeks to cause you a problem.  This is why dental check ups every two months are necessary as long as you have a dry mouth. Radiation caries typically, but not always, start at your gum line where it is hard to see the cavity.  It is therefore also hard to fill these cavities adequately.  This high rate of cavities happens because your mouth no longer has saliva and therefore the acid made by the germs starts the decay process.  Whenever you eat anything the germs in your mouth change the food into acid.  The acid then burns a small hole in your tooth.  This small hole is the beginning of a cavity.  If this is not treated then it will grow bigger and become  a cavity.  The way to avoid this hole getting bigger is to use fluoride every evening as prescribed by your dentist.  You have to make sure that your teeth are very clean before you use the fluoride.  This fluoride in turn will strengthen your teeth and prepare them for another day of fighting acid.  If you develop radiation caries many times the damage is so large that you will have to have all your teeth removed.  This could be a big problem if some of these teeth are in the field of radiation.  Further details of why this could be a big problem will follow.  (See Osteoradionecrosis).  Loss of taste (dysgeusia) This happens to varying degrees once you've had radiation therapy to your jaw region.  Many times taste is not  completely lost but becomes limited.  The loss of taste is mostly due to radiation affecting your taste buds.  However if you have no saliva in your mouth to carry the flavor to your taste buds it would be difficult for your taste buds to taste anything.  That is why using water or a prescription for Salagen prior to meals and during meals may help with some of the taste.  Keep in mind that taste generally returns very slowly over the course of several months or several years after radiation therapy.  Don't give up hope.  Trismus According to your Radiation Oncologist your TMJ or jaw joints are going to be partially or fully in the field of radiation.  This means that over time the muscles that help you open and close your mouth may get stiff.  This will potentially result in your not being able to open your mouth wide enough or as wide as you can open it now.  Le me give you an example of how slowly this happens and how unaware people are of it.  A gentlemen that had radiation therapy two years ago came back to me complaining that bananas are just too large for him to be able to fit them in between his teeth.  He was not able to open wide enough to bite into a banana.  This happens slowly and over a period of time.  What do we do to try and prevent this?  Your dentist will probably give you a stack of sticks called a trismus exercise device .  This stack will help your remind your muscles and your jaw joint to open up to the same distance every day.  Use these sticks every morning when you wake up according to the instructions given by the dentist.   You must use these sticks for at least one to two years after radiation therapy.  The reason for that is because it happens so slowly and keeps going on for about two years after radiation therapy.  Your hospital dentist will help you monitor your mouth opening and make sure that it's not getting smaller.  Osteoradionecrosis (ORN) This is a condition where your  jaw bone after having had radiation therapy becomes very dry.  It has very little blood supply to keep it alive.  If you develop a cavity that turns into an abscess or an infection then the jaw bone does not have enough blood supply to help fight the infection.  At this point it is very likely that the infection could cause the death of your jaw bone.  When you have dead bone it has to be removed.  Therefore  you might end up having to have surgery to remove part of your jaw bone, the part of the jaw bone that has been affected.   Healing is also a problem if you are to have surgery in the areas where the bone has had radiation therapy.  The same reasons apply.  If you have surgery you need more blood supply which is not available.  When blood supply and oxygen are not available again, there is a chance for the bone to die.  Occasionally ORN happens on its own with no obvious reason.  This is quite rare.  We believe that patients who continue to smoke and/or drink alcohol have a higher chance of having this bone problem.  Therefore once your jaw bone has had radiation therapy if there are any teeth in that area, you should never have them pulled.  You should also never have any surgery on your teeth or gums in that area unless the oral surgeon or Periodontist is aware of your history of radiation. There is some expensive management techniques that might be used to limit your risks.  The risks for ORN either from infection or spontaneous ( or on it's own) are life long.    FLUORIDE TRAYS PATIENT INSTRUCTIONS    Obtain prescription from the pharmacy.  Don't be surprised if it needs to be ordered.  Be sure to let the pharmacy know when you are close to needing a new refill for them to have it ready for you without interruption of Fluoride use.  The best time to use your Fluoride is before bed time.  You must brush your teeth very well and floss before using the Fluoride in order to get the best use out  of the Fluoride treatments.  Place 1 drop of Fluoride gel per tooth in the tray.  Place the tray on your lower teeth and your upper teeth.  Make sure the trays are seated all the way.  Remember, they only fit one way on your teeth.  Insert for 5 full minutes.  At the end of the 5 minutes, take the trays out.  SPIT OUT excess. .  Do NOT rinse your mouth!  Do NOT eat or drink after treatments for at least 30 minutes.  This is why the best time for your treatments is before bedtime.  Clean the inside of your Fluoride trays using COLD WATER and a toothbrush.  In order to keep your Trays from discoloring and free from odors, soak them overnight in denture cleaners such as Efferdent.  Do not use bleach or non denture products.  Store the trays in a safe dry place AWAY from any heat until your next treatment.  If anything happens to your Fluoride trays, or they don't fit as well after any dental work, please let us know as soon as possible.   TRISMUS  Trismus is a condition where the jaw does not allow the mouth to open as wide as it usually does.  This can happen almost suddenly, or in other cases the process is so slow, it is hard to notice it-until it is too far along.  When the jaw joints and/or muscles have been exposed to radiation treatments, the onset of Trismus is very slow.  This is because the muscles are losing their stretching ability over a long period of time, as long as 2 YEARS after the end of radiation.  It is therefore important to exercise these muscles and joints.  TRISMUS EXERCISES  Stack of tongue depressors measuring the same or a little less than the last documented MIO (Maximum Interincisal Opening).  Secure them with a rubber band on both ends.  Place the stack in the patient's mouth, supporting the other end.  Allow 30 seconds for muscle stretching.  Rest for a few seconds.  Repeat 3-5 times  For all radiation patients, this exercise is recommended in the  mornings and evenings unless otherwise instructed.  The exercise should be done for a period of 2 YEARS after the end of radiation.  MIO should be checked routinely on recall dental visits by the general dentist or the hospital dentist.  The patient is advised to report any changes, soreness, or difficulties encountered when doing the exercises.

## 2016-09-14 ENCOUNTER — Other Ambulatory Visit: Payer: Self-pay | Admitting: Adult Health

## 2016-09-14 ENCOUNTER — Ambulatory Visit: Payer: Commercial Managed Care - HMO | Attending: Radiation Oncology

## 2016-09-14 DIAGNOSIS — I890 Lymphedema, not elsewhere classified: Secondary | ICD-10-CM | POA: Diagnosis present

## 2016-09-14 DIAGNOSIS — L599 Disorder of the skin and subcutaneous tissue related to radiation, unspecified: Secondary | ICD-10-CM | POA: Diagnosis present

## 2016-09-14 DIAGNOSIS — R1310 Dysphagia, unspecified: Secondary | ICD-10-CM | POA: Diagnosis not present

## 2016-09-14 DIAGNOSIS — C01 Malignant neoplasm of base of tongue: Secondary | ICD-10-CM

## 2016-09-14 NOTE — Patient Instructions (Signed)
Continue to do the exercises 2-3 times per week.

## 2016-09-14 NOTE — Therapy (Signed)
[facility] [address] [place], [place], [address] Phone: [phone]   Fax:  [phone]  Speech Language Pathology Treatment  Patient Details  Name: [name] Best MRN: [id number] Date of Birth: 08-Jul-1961 No Data Recorded  Encounter Date: 09/14/2016      End of Session - 09/14/16 1439    Visit Number 6   Number of Visits 7   Date for SLP Re-Evaluation 09/16/16   SLP Start Time 0086   SLP Stop Time  1433  day of d/c   SLP Time Calculation (min) 30 min   Activity Tolerance Patient tolerated treatment well      Past Medical History:  Diagnosis Date  . Anxiety   . C. difficile diarrhea 2014   treated with Flagyl  . Cancer ([place])   . Chronic back pain   . Diverticulitis    hospitalized in 2003  . Hx of radiation therapy 01/25/16- 03/15/16   Base of Tongue, bilateral neck  . Hyperlipidemia   . PONV (postoperative nausea and vomiting)     Past Surgical History:  Procedure Laterality Date  . CHOLECYSTECTOMY    . COLONOSCOPY N/A 07/02/2013   Jenkins:Sessile polyp (tubular adenoma) ranging between 3-68m in size in the proximal sigmoid colon/mild diverticulosis   . COLONOSCOPY     Dr. RLaural Golden prior to 2003 per patient  . DIRECT LARYNGOSCOPY N/A 12/10/2015   Procedure: DIRECT LARYNGOSCOPY;  Surgeon: SLeta Baptist MD;  [facility] [facility]  Service: ENT;  Laterality: N/A;  . ESOPHAGOGASTRODUODENOSCOPY N/A 07/02/2013   Jenkins:4 cm hiatal hernia/esophagus appeared normal/chronic gastritis. Clotest negative.  .Marland KitchenGALLBLADDER SURGERY    . KNEE ARTHROSCOPY WITH MEDIAL MENISECTOMY Left 04/14/2015   Procedure: KNEE ARTHROSCOPY WITH MEDIAL MENISECTOMY;  Surgeon: WSanjuana Kava MD;  Location: [facility];  Service: Orthopedics;  Laterality: Left;  . left elbow    . MASS BIOPSY Right 12/10/2015   Procedure: RIGHT EXCISIONAL NECK MASS BIOPSY;  Surgeon: SLeta Baptist MD;  [facility]  Service: ENT;   Laterality: Right;  . TONGUE BIOPSY Right 12/10/2015   Procedure: TONGUE BIOPSY;  Surgeon: SLeta Baptist MD;  [facility]  Service: ENT;  Laterality: Right;    There were no vitals filed for this visit.      Subjective Assessment - 09/14/16 1408    Subjective "I been doing whatever I want, and my saliva is coming back!"   Currently in Pain? No/denies               ADULT SLP TREATMENT - 09/14/16 1410      General Information   Behavior/Cognition Alert;Cooperative;Pleasant mood     Treatment Provided   Treatment provided Dysphagia     Dysphagia Treatment   Temperature Spikes Noted No   Treatment Methods Skilled observation;Therapeutic exercise   Patient observed directly with PO's Yes   Type of PO's observed Regular;Thin liquids   Oral Phase Signs & Symptoms Other (comment)  none noted   Pharyngeal Phase Signs & Symptoms Other (comment)  none noted   Other treatment/comments "The only thing I need now is taste buds." Pt performed HEP with independence, and POs were as above. Pt with what appears to be mild (mild-mod?) lymphedema in submental/thyroid area. SLP to contact NP for possible referral to PT to address/eval.            SLP Short Term Goals - 09/14/16 1442      SLP SHORT TERM  GOAL #1   Title pt will perform HEP with rare min A   Status Achieved     SLP SHORT TERM GOAL #2   Title pt will tell SLP why he is completing HEP   Status Achieved     SLP SHORT TERM GOAL #3   Title pt will tell SLP 3 s/s aspiration PNA   Status Achieved          SLP Long Term Goals - 09/14/16 1427      SLP LONG TERM GOAL #1   Title pt will tell SLP why a food journal can be beneficial for return to full, least restrictive PO diet   Status Achieved     SLP LONG TERM GOAL #2   Title pt will complete HEP independently over 3 visits   Baseline one visit 07-14-16, second visit 09-14-16   Status Partially Met          Plan - 09/14/16 1440     Clinical Impression Statement WNL swallowing at this time, with regular and thin. HEP was again completed with independence. Skilled ST will d/c at this time as patient continues with correct procedure of HEP and is safe at this time with PO intake. Pt was told to maintain HEP at frequency x2-3/week.   Treatment/Interventions Aspiration precaution training;Pharyngeal strengthening exercises;Oral motor exercises;Diet toleration management by SLP;Trials of upgraded texture/liquids;Compensatory techniques;Cueing hierarchy   Potential to Achieve Goals Good      Patient will benefit from skilled therapeutic intervention in order to improve the following deficits and impairments:   Dysphagia   SPEECH THERAPY DISCHARGE SUMMARY  Visits from Start of Care: 6   Current functional level related to goals / functional outcomes: Pt met all STGs and partially met all LTGs. He was independent with HEP over two months' time, and remained safe with POs (wihtout overt s/s aspiration, or of aspiration PNA) over those two months.   Remaining deficits: Lymphedema suspected. Referral suggested for PT to [name], NP.   Education / Equipment: HEP, late effects head/neck radiation on swallowing.  Plan: Patient agrees to discharge.  Patient goals were partially met. Patient is being discharged due to meeting the stated rehab goals.  ?????            Problem List Patient Active Problem List   Diagnosis Date Noted  . Prerenal acute renal failure ([place]) 02/01/2016  . Tinnitus of both ears 02/01/2016  . Dehydration 01/29/2016  . Cancer of base of tongue ([place]) 12/16/2015  . Diarrhea 09/19/2013  . GERD (gastroesophageal reflux disease) 09/19/2013  . Diverticulosis 08/09/2013  . LLQ pain 08/09/2013  . Chondromalacia of left patella 08/16/2011    [facility] ,MS, CCC-SLP  09/14/2016, 2:43 PM  [facility] [address] [place], [place], [address] Phone: [phone]   Fax:  [phone]   Name: [name] Best MRN: [id number] Date of Birth: 1961-02-06

## 2016-09-15 ENCOUNTER — Ambulatory Visit: Payer: Commercial Managed Care - HMO | Admitting: Physical Therapy

## 2016-09-15 DIAGNOSIS — R1310 Dysphagia, unspecified: Secondary | ICD-10-CM | POA: Diagnosis not present

## 2016-09-15 DIAGNOSIS — L599 Disorder of the skin and subcutaneous tissue related to radiation, unspecified: Secondary | ICD-10-CM

## 2016-09-15 DIAGNOSIS — I890 Lymphedema, not elsewhere classified: Secondary | ICD-10-CM

## 2016-09-15 NOTE — Therapy (Signed)
[place], [place], [address] Phone: [phone]   Fax:  [phone]  Physical Therapy Evaluation  Patient Details  Name: [name] Best MRN: [id number] Date of Birth: 10-08-1961 Referring Provider: Dr. [name]   Encounter Date: 09/15/2016      PT End of Session - 09/15/16 1211    Visit Number 1   Number of Visits 5   Date for PT Re-Evaluation 10/06/16   PT Start Time 1100   PT Stop Time 1130   PT Time Calculation (min) 30 min   Activity Tolerance Patient tolerated treatment well   Behavior During Therapy [facility] for tasks assessed/performed      Past Medical History:  Diagnosis Date  . Anxiety   . C. difficile diarrhea 2014   treated with Flagyl  . Cancer ([place])   . Chronic back pain   . Diverticulitis    hospitalized in 2003  . Hx of radiation therapy 01/25/16- 03/15/16   Base of Tongue, bilateral neck  . Hyperlipidemia   . PONV (postoperative nausea and vomiting)     Past Surgical History:  Procedure Laterality Date  . CHOLECYSTECTOMY    . COLONOSCOPY N/A 07/02/2013   Jenkins:Sessile polyp (tubular adenoma) ranging between 3-33mm in size in the proximal sigmoid colon/mild diverticulosis   . COLONOSCOPY     Dr. Laural Golden: prior to 2003 per patient  . DIRECT LARYNGOSCOPY N/A 12/10/2015   Procedure: DIRECT LARYNGOSCOPY;  Surgeon: Leta Baptist, MD;  Location: [facility];  Service: ENT;  Laterality: N/A;  . ESOPHAGOGASTRODUODENOSCOPY N/A 07/02/2013   Jenkins:4 cm hiatal hernia/esophagus appeared normal/chronic gastritis. Clotest negative.  Marland Kitchen GALLBLADDER SURGERY    . KNEE ARTHROSCOPY WITH MEDIAL MENISECTOMY Left 04/14/2015   Procedure: KNEE ARTHROSCOPY WITH MEDIAL MENISECTOMY;  Surgeon: Sanjuana Kava, MD;  Location[facility];  Service: Orthopedics;  Laterality: Left;  . left elbow    . MASS BIOPSY Right 12/10/2015   Procedure: RIGHT EXCISIONAL NECK MASS BIOPSY;  Surgeon: Leta Baptist, MD;   Location: [facility];  Service: ENT;  Laterality: Right;  . TONGUE BIOPSY Right 12/10/2015   Procedure: TONGUE BIOPSY;  Surgeon: Leta Baptist, MD;  Location: [facility];  Service: ENT;  Laterality: Right;    There were no vitals filed for this visit.       Subjective Assessment - 09/15/16 1110    Subjective "I didn't know anything was wrong" pt speech pathologist noticed swelling and asked pt to have it checked out    Pertinent History cancer at the base of the tounge with enlarged lymph node removed in November followed by chemo ( one session) and radiation.  Pt states he has lost 80 pounds during treatment.  He feels that he is improving    Currently in Pain? No/denies            [facility] PT Assessment - 09/15/16 0001      Assessment   Medical Diagnosis tongue cancer    Referring Provider Dr. [name]    Onset Date/Surgical Date 10/27/15     Precautions   Precautions Other (comment)   Precaution Comments previous cancer and radiation      Balance Screen   Has the patient fallen in the past 6 months Yes  pt had not been eating well., hasn't happened since    How many times? --  1   Has the patient had a decrease in activity level because of a fear of falling?  No   Is the patient reluctant to leave their home because of a fear of falling?  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  Well healed scar at right side of neck with visible skin changes from radiation      Coordination   Gross  Motor Movements are Fluid and Coordinated Yes     Posture/Postural Control   Posture/Postural Control No significant limitations   Posture Comments pt tends to sit with round shouldered posture but is able to correct with to neutral posture easily when he tries to do it.      Palpation   Palpation comment Mild firmness in right lateral neck with decrease in skin extensibility.            LYMPHEDEMA/ONCOLOGY QUESTIONNAIRE - 09/15/16 1116      Head and Neck   4 cm superior to sternal notch around neck 43 cm   6 cm superior to sternal notch around neck 39.5 cm   8 cm superior to sternal notch around neck 40 cm                [facility] Adult PT Treatment/Exercise - 09/15/16 0001      Self-Care   Self-Care Other Self-Care Comments   Other Self-Care Comments  brief introduction to manual lymph drainage and fashioned a chip pack for pt to wear at neck especially toward right side over radiated skin and scar area                 PT Education - 09/15/16 1211    Education provided Yes   Education Details brief introduction to MLD and use of chip pack    Person(s) Educated Patient   Methods Explanation;Demonstration   Comprehension Verbalized understanding;Returned demonstration                [facility] Goals - 09/15/16 1217      CC Long Term Goal  #1   Title Pt will report independence in manual lymph drainage , use of compression and remedial exercise so that he can continue his symptom management at home    Time 2   Period Weeks   Status New     CC Long Term Goal  #2   Title Pt will have .5 decrease at 8 cm prox to the sternal notch    Baseline 43cm   Time 2   Period Weeks   Status New            Plan - 09/15/16 1212    Clinical Impression Statement Pt with mild lymphedema in neck with skin changes especially on right side that will benefit from performance of and instruction of manual lymph drainage and use of chip pack.  He will bring  his wife next session for instruction. Antcipate he will not need many session to learn techniques.   Rehab Potential Good   Clinical Impairments Affecting Rehab Potential previous radiation    PT Frequency 2x / week  PT Duration 2 weeks   PT Treatment/Interventions ADLs/Self Care Home Management;Therapeutic exercise;Manual techniques;Manual lymph drainage;Scar mobilization;Patient/family education;Taping   PT Next Visit Plan perform and instruct in lymphedema risk reduction, self manual lymph drainage ( issue handout) neck range of motion and chin protraction as remedial exercise, assess use of chip pack    Consulted and Agree with Plan of Care Patient      Patient will benefit from skilled therapeutic intervention in order to improve the following deficits and impairments:  Increased fascial restricitons, Decreased knowledge of precautions, Decreased knowledge of use of DME, Decreased scar mobility, Increased edema  Visit Diagnosis: Lymphedema, not elsewhere classified - Plan: PT plan of care cert/re-cert  Disorder of the skin and subcutaneous tissue related to radiation, unspecified - Plan: PT plan of care cert/re-cert     Problem List Patient Active Problem List   Diagnosis Date Noted  . Prerenal acute renal failure ([place]) 02/01/2016  . Tinnitus of both ears 02/01/2016  . Dehydration 01/29/2016  . Cancer of base of tongue ([place]) 12/16/2015  . Diarrhea 09/19/2013  . GERD (gastroesophageal reflux disease) 09/19/2013  . Diverticulosis 08/09/2013  . LLQ pain 08/09/2013  . Chondromalacia of left patella 08/16/2011   Donato Heinz. Owens Shark PT  [place] Levo 09/15/2016, 12:21 PM  [facility] [place], [place], [address] Phone: [phone]   Fax:  [phone]  Name: HATCH PITZER MRN: [id number] Date of Birth: September 10, 1961

## 2016-09-20 ENCOUNTER — Ambulatory Visit: Payer: Commercial Managed Care - HMO | Admitting: Physical Therapy

## 2016-09-20 DIAGNOSIS — L599 Disorder of the skin and subcutaneous tissue related to radiation, unspecified: Secondary | ICD-10-CM

## 2016-09-20 DIAGNOSIS — R1310 Dysphagia, unspecified: Secondary | ICD-10-CM | POA: Diagnosis not present

## 2016-09-20 DIAGNOSIS — I890 Lymphedema, not elsewhere classified: Secondary | ICD-10-CM

## 2016-09-20 NOTE — Patient Instructions (Signed)
Sit in front of a mirror. Do 5 slow deep breaths, breathing in through the nose and out through the mouth, letting your belly "inflate" as you breathe in.  1) Place hands on areas just behind collar bones and do 10 stationary circles with stretch in outward directions. 2) Do stationary circles at each armpit about 10 times. 3) Place one hand on the front of the opposite shoulder and do stationary circles with stretch downward toward underarm. 4) Repeat #1 above. 5) Imagine a river runnning in a line from the earlobe straight down the neck.  Place hands just behind this and do circles with stretch coming forward slightly and down, thinking about fluid flowing down that river.  Do 10 times. 6) Place one hand just in front of the river on one side and do circles with a slight back and then downward stretch, thinking again about putting that fluid in the river.  Do 10-20 times on each side. 7) Place one hand just slightly in front of the spot you just did and do the same thing.  DO THIS VERY GENTLY. 8) Use the side of the fingers of your dominant hand to pump downward starting just under the chin and "stair stepping" downward with a stretch, working down to the chest. 9) Repeat #1. 10) Do stationary circles on each side of the face just above the chin, out and down with the stretch 10 times. 11) Do stationary circles on each side of the face on the cheeks with pressure going back and down, 10 times. 12) Do stationary circles on each side of the face between the eyes and ears, again back and downward 10 times. 13) Repeat steps 9,8,7,1,3 and 2 in that order!  Do not slide on the skin, but STRETCH it with your motions. Only give enough pressure to stretch the skin. Make sure to wash your hands prior to doing this. DO THIS SLOWLY, PLEASE!  And do once a day. 

## 2016-09-20 NOTE — Therapy (Signed)
[place], [place], [address] Phone: [phone]   Fax:  [phone]  Physical Therapy Treatment  Patient Details  Name: [name] Best MRN: [id number] Date of Birth: 09/04/1961 Referring Provider: Dr. [name]   Encounter Date: 09/20/2016      PT End of Session - 09/20/16 1702    Visit Number 2   Number of Visits 5   Date for PT Re-Evaluation 10/06/16   PT Start Time 1100   PT Stop Time 1130  pt only neede 30 minutes for session    PT Time Calculation (min) 30 min   Activity Tolerance Patient tolerated treatment well   Behavior During Therapy [facility] for tasks assessed/performed      Past Medical History:  Diagnosis Date  . Anxiety   . C. difficile diarrhea 2014   treated with Flagyl  . Cancer ([place])   . Chronic back pain   . Diverticulitis    hospitalized in 2003  . Hx of radiation therapy 01/25/16- 03/15/16   Base of Tongue, bilateral neck  . Hyperlipidemia   . PONV (postoperative nausea and vomiting)     Past Surgical History:  Procedure Laterality Date  . CHOLECYSTECTOMY    . COLONOSCOPY N/A 07/02/2013   Jenkins:Sessile polyp (tubular adenoma) ranging between 3-68m in size in the proximal sigmoid colon/mild diverticulosis   . COLONOSCOPY     Dr. RLaural Golden prior to 2003 per patient  . DIRECT LARYNGOSCOPY N/A 12/10/2015   Procedure: DIRECT LARYNGOSCOPY;  Surgeon: SLeta Baptist MD;  [facility]  Service: ENT;  Laterality: N/A;  . ESOPHAGOGASTRODUODENOSCOPY N/A 07/02/2013   Jenkins:4 cm hiatal hernia/esophagus appeared normal/chronic gastritis. Clotest negative.  .Marland KitchenGALLBLADDER SURGERY    . KNEE ARTHROSCOPY WITH MEDIAL MENISECTOMY Left 04/14/2015   Procedure: KNEE ARTHROSCOPY WITH MEDIAL MENISECTOMY;  Surgeon: WSanjuana Kava MD;  [facility];  Service: Orthopedics;  Laterality: Left;  . left elbow    . MASS BIOPSY Right 12/10/2015   Procedure: RIGHT EXCISIONAL NECK MASS  BIOPSY;  Surgeon: SLeta Baptist MD;  [place] [facility]  Service: ENT;  Laterality: Right;  . TONGUE BIOPSY Right 12/10/2015   Procedure: TONGUE BIOPSY;  Surgeon: SLeta Baptist MD;  [place] [facility]  Service: ENT;  Laterality: Right;    There were no vitals filed for this visit.      Subjective Assessment - 09/20/16 1111    Subjective "I feel great"  pt states he has been doing the massage   Pertinent History cancer at the base of the tounge with enlarged lymph node removed in November followed by chemo ( one session) and radiation.  Pt states he has lost 80 pounds during treatment.  He feels that he is improving    Currently in Pain? No/denies               LYMPHEDEMA/ONCOLOGY QUESTIONNAIRE - 09/20/16 1131      Head and Neck   4 cm superior to sternal notch around neck 40.5 cm   6 cm superior to sternal notch around neck 39.4 cm   8 cm superior to sternal notch around neck 39 cm                  [facility] Adult PT Treatment/Exercise - 09/20/16 0001      Self-Care   Self-Care Other Self-Care Comments   Other Self-Care Comments  Instucted pt and wife in hand over hand technique for manual  lymph drainage for the short neck, axillary nodes, shoudler collectors anterior and lateral neck with cues to decrease speed and lighten touch. encouraged pt continue to wear chip pack as he can     Manual Therapy   Manual Therapy Manual Lymphatic Drainage (MLD)   Manual therapy comments pt has better mobility around scar at right neck with softer tissue around it.    Manual Lymphatic Drainage (MLD) short neck, shoulder collectors, axillary nodes, right and left neck toward posterior neck, and anterior neck and chest.                         Long Term Clinic Goals - 09/20/16 1151      CC Long Term Goal  #1   Title Pt will report independence in manual lymph drainage , use of compression and remedial exercise so that he can continue his  symptom management at home   Patient and wife able to perform MLD   Status Achieved     CC Long Term Goal  #2   Title Pt will have .5 decrease at 8 cm prox to the sternal notch    Baseline 43cm   Status Achieved            Plan - 09/20/16 1300    Clinical Impression Statement Pt has made significant improvement.  He and his wife were here today and able to demonstrate manual lymph drainage.  He is doing range of motion exercise and is using his chip pack occasionally.  Goals have been met and he is ready to discharge from PT/   Clinical Impairments Affecting Rehab Potential previous radiation    PT Next Visit Plan discharge this visit   Consulted and Agree with Plan of Care Patient      Patient will benefit from skilled therapeutic intervention in order to improve the following deficits and impairments:     Visit Diagnosis: Lymphedema, not elsewhere classified  Disorder of the skin and subcutaneous tissue related to radiation, unspecified     Problem List Patient Active Problem List   Diagnosis Date Noted  . Prerenal acute renal failure ([place]) 02/01/2016  . Tinnitus of both ears 02/01/2016  . Dehydration 01/29/2016  . Cancer of base of tongue ([place]) 12/16/2015  . Diarrhea 09/19/2013  . GERD (gastroesophageal reflux disease) 09/19/2013  . Diverticulosis 08/09/2013  . LLQ pain 08/09/2013  . Chondromalacia of left patella 08/16/2011   PHYSICAL THERAPY DISCHARGE SUMMARY  Visits from Start of Care:2  Current functional level related to goals / functional outcomes: Independent in self care    Remaining deficits: Mild lymphedema in neck    Education / Equipment: Manual lymph drainage, exercise and use of compression  Plan: Patient agrees to discharge.  Patient goals were met. Patient is being discharged due to meeting the stated rehab goals.  ?????    Donato Heinz. Owens Shark PT  Norwood Levo 09/20/2016, 5:03 PM  [place], [place], [address] Phone: [phone]   Fax:  [phone]  Name: [name] Best MRN: [id number] Date of Birth: 08/11/1961

## 2016-09-22 ENCOUNTER — Encounter: Payer: Self-pay | Admitting: Physical Therapy

## 2016-09-27 ENCOUNTER — Encounter: Payer: Self-pay | Admitting: Physical Therapy

## 2016-09-29 ENCOUNTER — Encounter: Payer: Self-pay | Admitting: Physical Therapy

## 2016-10-18 ENCOUNTER — Encounter: Payer: Self-pay | Admitting: Orthopaedic Surgery

## 2016-10-18 ENCOUNTER — Ambulatory Visit (INDEPENDENT_AMBULATORY_CARE_PROVIDER_SITE_OTHER): Payer: Commercial Managed Care - HMO | Admitting: Orthopaedic Surgery

## 2016-10-18 ENCOUNTER — Ambulatory Visit (INDEPENDENT_AMBULATORY_CARE_PROVIDER_SITE_OTHER): Payer: Commercial Managed Care - HMO

## 2016-10-18 VITALS — BP 114/76 | HR 76 | Ht 74.5 in | Wt 199.0 lb

## 2016-10-18 DIAGNOSIS — G8929 Other chronic pain: Secondary | ICD-10-CM

## 2016-10-18 DIAGNOSIS — M25562 Pain in left knee: Secondary | ICD-10-CM

## 2016-10-18 NOTE — Progress Notes (Signed)
Patient [name] Best, male DOB:06/06/1961, 55 y.o. [id number]  Chief Complaint  Patient presents with  . Follow-up    left knee pain    HPI  [name] Best is a 55 y.o. male who has left knee pain.  I did arthroscopy on his left knee in April of 2016.  He did well until just recently when he began to have some swelling at times and some pain at times.  He has no giving way, no locking.  He is not on any NSAID.  He has been on weight reduction over the last year and has lost 80 pounds.  He has chronic left hip pain treated by another physician. HPI  Body mass index is 25.21 kg/m.  ROS  Review of Systems  HENT: Negative for congestion.   Respiratory: Negative for cough and shortness of breath.   Cardiovascular: Negative for leg swelling.  Endocrine: Negative for cold intolerance.  Musculoskeletal: Positive for arthralgias, gait problem and joint swelling.  Allergic/Immunologic: Negative for environmental allergies.  Psychiatric/Behavioral: The patient is nervous/anxious.     Past Medical History:  Diagnosis Date  . Anxiety   . C. difficile diarrhea 2014   treated with Flagyl  . Cancer ([place])   . Chronic back pain   . Diverticulitis    hospitalized in 2003  . Hx of radiation therapy 01/25/16- 03/15/16   Base of Tongue, bilateral neck  . Hyperlipidemia   . PONV (postoperative nausea and vomiting)     Past Surgical History:  Procedure Laterality Date  . CHOLECYSTECTOMY    . COLONOSCOPY N/A 07/02/2013   Jenkins:Sessile polyp (tubular adenoma) ranging between 3-23mm in size in the proximal sigmoid colon/mild diverticulosis   . COLONOSCOPY     Dr. Laural Golden: prior to 2003 per patient  . DIRECT LARYNGOSCOPY N/A 12/10/2015   Procedure: DIRECT LARYNGOSCOPY;  Surgeon: Leta Baptist, MD;  Location: [place];  Service: ENT;  Laterality: N/A;  . ESOPHAGOGASTRODUODENOSCOPY N/A 07/02/2013   Jenkins:4 cm hiatal hernia/esophagus appeared normal/chronic gastritis. Clotest  negative.  Marland Kitchen GALLBLADDER SURGERY    . KNEE ARTHROSCOPY WITH MEDIAL MENISECTOMY Left 04/14/2015   Procedure: KNEE ARTHROSCOPY WITH MEDIAL MENISECTOMY;  Surgeon: Sanjuana Kava, MD;  [facility];  Service: Orthopedics;  Laterality: Left;  . left elbow    . MASS BIOPSY Right 12/10/2015   Procedure: RIGHT EXCISIONAL NECK MASS BIOPSY;  Surgeon: Leta Baptist, MD;  Location: [place];  Service: ENT;  Laterality: Right;  . TONGUE BIOPSY Right 12/10/2015   Procedure: TONGUE BIOPSY;  Surgeon: Leta Baptist, MD;  Location: [place];  Service: ENT;  Laterality: Right;    Family History  Problem Relation Age of Onset  . Heart disease    . Colon cancer Neg Hx   . Colon polyps Neg Hx   . Heart failure Mother 78  . Heart attack Father 62  . Cancer Paternal Uncle     died of cancer    Social History Social History  Substance Use Topics  . Smoking status: Never Smoker  . Smokeless tobacco: Never Used  . Alcohol use No    Allergies  Allergen Reactions  . Tetracyclines & Related Hives    Current Outpatient Prescriptions  Medication Sig Dispense Refill  . acetaminophen (TYLENOL) 500 MG tablet Take 500 mg by mouth every 6 (six) hours as needed.    . diazepam (VALIUM) 10 MG tablet Take 1 tablet (10 mg total) by mouth at bedtime as needed  for anxiety or sleep. Reported on 02/16/2016 30 tablet 0  . fluconazole (DIFLUCAN) 100 MG tablet Take 1 tablet (100 mg total) by mouth daily. Take 2 tablets today (total 200 mg). Then take 1 tablet daily until all medication is gone. Do not take your Pravastatin while you are taking the Fluconazole. (Patient not taking: Reported on 07/08/2016) 15 tablet 0  . HYDROcodone-acetaminophen (NORCO) 7.5-325 MG tablet Take 1 tab up to QID as needed for pain. 30 tablet 0  . lidocaine (XYLOCAINE) 2 % solution Mix 1 part 2%viscous lidocaine,1part H2O.Swish and/or swallow 28mL of this mixture,50min before meals and at bedtime, up to QID (Patient not  taking: Reported on 03/14/2016) 100 mL 5  . pravastatin (PRAVACHOL) 20 MG tablet Take 20 mg by mouth daily. Reported on 03/14/2016    . prochlorperazine (COMPAZINE) 10 MG tablet Take 1 tablet (10 mg total) by mouth every 6 (six) hours as needed for nausea or vomiting. (Patient not taking: Reported on 07/08/2016) 90 tablet 1  . sodium fluoride (FLUORISHIELD) 1.1 % GEL dental gel Instill one drop of gel per tooth space of tray. Place over teeth for 5 minutes. Remove. Spit out excess. Repeat nightly. 120 mL prn  . sucralfate (CARAFATE) 1 g tablet Dissolve 1 tablet in 36mL H2O and swallow up to QID,PRN sore throat. (Patient not taking: Reported on 03/07/2016) 60 tablet 5   No current facility-administered medications for this visit.      Physical Exam  Blood pressure 114/76, pulse 76, height 6' 2.5" (1.892 m), weight 199 lb (90.3 kg).  Constitutional: overall normal hygiene, normal nutrition, well developed, normal grooming, normal body habitus. Assistive device:none  Musculoskeletal: gait and station Limp right, muscle tone and strength are normal, no tremors or atrophy is present.  .  Neurological: coordination overall normal.  Deep tendon reflex/nerve stretch intact.  Sensation normal.  Cranial nerves II-XII intact.   Skin:   Normal overall no scars, lesions, ulcers or rashes. No psoriasis.  Psychiatric: Alert and oriented x 3.  Recent memory intact, remote memory unclear.  Normal mood and affect. Well groomed.  Good eye contact.  Cardiovascular: overall no swelling, no varicosities, no edema bilaterally, normal temperatures of the legs and arms, no clubbing, cyanosis and good capillary refill.  Lymphatic: palpation is normal.  The left lower extremity is examined:  Inspection:  Thigh:  Non-tender and no defects  Knee does not have swelling 0 effusion.                        Joint tenderness is present                        Patient is tender over the medial joint line  Lower Leg:  Has  normal appearance and no tenderness or defects  Ankle:  Non-tender and no defects  Foot:  Non-tender and no defects Range of Motion:  Knee:  Range of motion is: 0-110                        Crepitus is  present  Ankle:  Range of motion is normal. Strength and Tone:  The left lower extremity has normal strength and tone. Stability:  Knee:  The knee is stable.  Ankle:  The ankle is stable.    The patient has been educated about the nature of the problem(s) and counseled on treatment options.  The patient appeared to  understand what I have discussed and is in agreement with it.  Encounter Diagnosis  Name Primary?  . Chronic pain of left knee Yes   PROCEDURE NOTE:  The patient requests injections of the left knee , verbal consent was obtained.  The left knee was prepped appropriately after time out was performed.   Sterile technique was observed and injection of 1 cc of Depo-Medrol 40 mg with several cc's of plain xylocaine. Anesthesia was provided by ethyl chloride and a 20-gauge needle was used to inject the knee area. The injection was tolerated well.  A band aid dressing was applied.  The patient was advised to apply ice later today and tomorrow to the injection sight as needed.  PLAN Call if any problems.  Precautions discussed.  Continue current medications.   Return to clinic 1 month   Electronically Signed Sanjuana Kava, MD 10/24/20172:31 PM

## 2016-11-15 ENCOUNTER — Ambulatory Visit (INDEPENDENT_AMBULATORY_CARE_PROVIDER_SITE_OTHER): Payer: Commercial Managed Care - HMO | Admitting: Orthopaedic Surgery

## 2016-11-15 VITALS — BP 126/89 | HR 69 | Temp 97.5°F | Ht 74.5 in | Wt 203.0 lb

## 2016-11-15 DIAGNOSIS — G8929 Other chronic pain: Secondary | ICD-10-CM

## 2016-11-15 DIAGNOSIS — M25562 Pain in left knee: Secondary | ICD-10-CM

## 2016-11-15 NOTE — Progress Notes (Signed)
Patient [id number] [name] Best, male DOB:November 21, 1961, 54 y.o. [id number]  Chief Complaint  Patient presents with  . Follow-up    Left knee pain    HPI  [name] Best is a 55 y.o. male who has chronic pain of the left knee.  He has no new trauma.  He has good and bad days.  He has more pain on ladders and steps.  He has swelling but no giving way, no redness.  He is taking his medicine. His joint pains are less since he lost the 80 pounds. HPI  Body mass index is 25.71 kg/m.  ROS  Review of Systems  HENT: Negative for congestion.   Respiratory: Negative for cough and shortness of breath.   Cardiovascular: Negative for leg swelling.  Endocrine: Negative for cold intolerance.  Musculoskeletal: Positive for arthralgias, gait problem and joint swelling.  Allergic/Immunologic: Negative for environmental allergies.  Psychiatric/Behavioral: The patient is nervous/anxious.     Past Medical History:  Diagnosis Date  . Anxiety   . C. difficile diarrhea 2014   treated with Flagyl  . Cancer ([place])   . Chronic back pain   . Diverticulitis    hospitalized in 2003  . Hx of radiation therapy 01/25/16- 03/15/16   Base of Tongue, bilateral neck  . Hyperlipidemia   . PONV (postoperative nausea and vomiting)     Past Surgical History:  Procedure Laterality Date  . CHOLECYSTECTOMY    . COLONOSCOPY N/A 07/02/2013   Jenkins:Sessile polyp (tubular adenoma) ranging between 3-65mm in size in the proximal sigmoid colon/mild diverticulosis   . COLONOSCOPY     Dr. Laural Golden: prior to 2003 per patient  . DIRECT LARYNGOSCOPY N/A 12/10/2015   Procedure: DIRECT LARYNGOSCOPY;  Surgeon: Leta Baptist, MD;  Location: [place];  Service: ENT;  Laterality: N/A;  . ESOPHAGOGASTRODUODENOSCOPY N/A 07/02/2013   Jenkins:4 cm hiatal hernia/esophagus appeared normal/chronic gastritis. Clotest negative.  Marland Kitchen GALLBLADDER SURGERY    . KNEE ARTHROSCOPY WITH MEDIAL MENISECTOMY Left 04/14/2015   Procedure: KNEE  ARTHROSCOPY WITH MEDIAL MENISECTOMY;  Surgeon: Sanjuana Kava, MD;  [facility];  Service: Orthopedics;  Laterality: Left;  . left elbow    . MASS BIOPSY Right 12/10/2015   Procedure: RIGHT EXCISIONAL NECK MASS BIOPSY;  Surgeon: Leta Baptist, MD;  Location: [facility];  Service: ENT;  Laterality: Right;  . TONGUE BIOPSY Right 12/10/2015   Procedure: TONGUE BIOPSY;  Surgeon: Leta Baptist, MD;  Location: [place];  Service: ENT;  Laterality: Right;    Family History  Problem Relation Age of Onset  . Heart disease    . Colon cancer Neg Hx   . Colon polyps Neg Hx   . Heart failure Mother 10  . Heart attack Father 59  . Cancer Paternal Uncle     died of cancer    Social History Social History  Substance Use Topics  . Smoking status: Never Smoker  . Smokeless tobacco: Never Used  . Alcohol use No    Allergies  Allergen Reactions  . Tetracyclines & Related Hives    Current Outpatient Prescriptions  Medication Sig Dispense Refill  . acetaminophen (TYLENOL) 500 MG tablet Take 500 mg by mouth every 6 (six) hours as needed.    . diazepam (VALIUM) 10 MG tablet Take 1 tablet (10 mg total) by mouth at bedtime as needed for anxiety or sleep. Reported on 02/16/2016 30 tablet 0  . fluconazole (DIFLUCAN) 100 MG tablet Take 1 tablet (100 mg total)  by mouth daily. Take 2 tablets today (total 200 mg). Then take 1 tablet daily until all medication is gone. Do not take your Pravastatin while you are taking the Fluconazole. (Patient not taking: Reported on 07/08/2016) 15 tablet 0  . HYDROcodone-acetaminophen (NORCO) 7.5-325 MG tablet Take 1 tab up to QID as needed for pain. 30 tablet 0  . lidocaine (XYLOCAINE) 2 % solution Mix 1 part 2%viscous lidocaine,1part H2O.Swish and/or swallow 65mL of this mixture,38min before meals and at bedtime, up to QID (Patient not taking: Reported on 03/14/2016) 100 mL 5  . pravastatin (PRAVACHOL) 20 MG tablet Take 20 mg by mouth daily. Reported on  03/14/2016    . prochlorperazine (COMPAZINE) 10 MG tablet Take 1 tablet (10 mg total) by mouth every 6 (six) hours as needed for nausea or vomiting. (Patient not taking: Reported on 07/08/2016) 90 tablet 1  . sodium fluoride (FLUORISHIELD) 1.1 % GEL dental gel Instill one drop of gel per tooth space of tray. Place over teeth for 5 minutes. Remove. Spit out excess. Repeat nightly. 120 mL prn  . sucralfate (CARAFATE) 1 g tablet Dissolve 1 tablet in 63mL H2O and swallow up to QID,PRN sore throat. (Patient not taking: Reported on 03/07/2016) 60 tablet 5   No current facility-administered medications for this visit.      Physical Exam  Blood pressure 126/89, pulse 69, temperature 97.5 F (36.4 C), height 6' 2.5" (1.892 m), weight 203 lb (92.1 kg).  Constitutional: overall normal hygiene, normal nutrition, well developed, normal grooming, normal body habitus. Assistive device:none  Musculoskeletal: gait and station Limp left, muscle tone and strength are normal, no tremors or atrophy is present.  .  Neurological: coordination overall normal.  Deep tendon reflex/nerve stretch intact.  Sensation normal.  Cranial nerves II-XII intact.   Skin:   Normal overall no scars, lesions, ulcers or rashes. No psoriasis.  Psychiatric: Alert and oriented x 3.  Recent memory intact, remote memory unclear.  Normal mood and affect. Well groomed.  Good eye contact.  Cardiovascular: overall no swelling, no varicosities, no edema bilaterally, normal temperatures of the legs and arms, no clubbing, cyanosis and good capillary refill.  Lymphatic: palpation is normal.  The left lower extremity is examined:  Inspection:  Thigh:  Non-tender and no defects  Knee has swelling 1+ effusion.                        Joint tenderness is present                        Patient is tender over the medial joint line  Lower Leg:  Has normal appearance and no tenderness or defects  Ankle:  Non-tender and no defects  Foot:   Non-tender and no defects Range of Motion:  Knee:  Range of motion is: 0-105                        Crepitus is  present  Ankle:  Range of motion is normal. Strength and Tone:  The left lower extremity has normal strength and tone. Stability:  Knee:  The knee is stable.  Ankle:  The ankle is stable.    The patient has been educated about the nature of the problem(s) and counseled on treatment options.  The patient appeared to understand what I have discussed and is in agreement with it.  Encounter Diagnosis  Name Primary?  . Chronic  pain of left knee Yes    PLAN Call if any problems.  Precautions discussed.  Continue current medications.   Return to clinic 6 weeks   Electronically Signed Sanjuana Kava, MD 11/21/20179:44 AM

## 2016-12-01 ENCOUNTER — Other Ambulatory Visit (HOSPITAL_BASED_OUTPATIENT_CLINIC_OR_DEPARTMENT_OTHER): Payer: Commercial Managed Care - HMO

## 2016-12-01 ENCOUNTER — Ambulatory Visit (HOSPITAL_BASED_OUTPATIENT_CLINIC_OR_DEPARTMENT_OTHER): Payer: Commercial Managed Care - HMO | Admitting: Adult Health

## 2016-12-01 ENCOUNTER — Encounter: Payer: Self-pay | Admitting: Adult Health

## 2016-12-01 VITALS — BP 132/89 | HR 79 | Temp 97.6°F | Resp 18 | Ht 74.5 in | Wt 199.6 lb

## 2016-12-01 DIAGNOSIS — R42 Dizziness and giddiness: Secondary | ICD-10-CM

## 2016-12-01 DIAGNOSIS — C01 Malignant neoplasm of base of tongue: Secondary | ICD-10-CM

## 2016-12-01 DIAGNOSIS — Z85810 Personal history of malignant neoplasm of tongue: Secondary | ICD-10-CM

## 2016-12-01 DIAGNOSIS — R634 Abnormal weight loss: Secondary | ICD-10-CM

## 2016-12-01 LAB — CBC WITH DIFFERENTIAL/PLATELET
BASO%: 1.2 % (ref 0.0–2.0)
Basophils Absolute: 0.1 10*3/uL (ref 0.0–0.1)
EOS%: 3.8 % (ref 0.0–7.0)
Eosinophils Absolute: 0.2 10*3/uL (ref 0.0–0.5)
HCT: 44.6 % (ref 38.4–49.9)
HEMOGLOBIN: 14.4 g/dL (ref 13.0–17.1)
LYMPH%: 7.1 % — AB (ref 14.0–49.0)
MCH: 28.4 pg (ref 27.2–33.4)
MCHC: 32.2 g/dL (ref 32.0–36.0)
MCV: 88.1 fL (ref 79.3–98.0)
MONO#: 0.5 10*3/uL (ref 0.1–0.9)
MONO%: 11 % (ref 0.0–14.0)
NEUT%: 76.9 % — ABNORMAL HIGH (ref 39.0–75.0)
NEUTROS ABS: 3.8 10*3/uL (ref 1.5–6.5)
Platelets: 203 10*3/uL (ref 140–400)
RBC: 5.07 10*6/uL (ref 4.20–5.82)
RDW: 14.2 % (ref 11.0–14.6)
WBC: 4.9 10*3/uL (ref 4.0–10.3)
lymph#: 0.3 10*3/uL — ABNORMAL LOW (ref 0.9–3.3)

## 2016-12-01 LAB — MAGNESIUM: Magnesium: 2 mg/dl (ref 1.5–2.5)

## 2016-12-01 LAB — COMPREHENSIVE METABOLIC PANEL
ALBUMIN: 3.5 g/dL (ref 3.5–5.0)
ALT: 9 U/L (ref 0–55)
AST: 14 U/L (ref 5–34)
Alkaline Phosphatase: 96 U/L (ref 40–150)
Anion Gap: 8 mEq/L (ref 3–11)
BILIRUBIN TOTAL: 0.36 mg/dL (ref 0.20–1.20)
BUN: 11.9 mg/dL (ref 7.0–26.0)
CO2: 30 meq/L — AB (ref 22–29)
CREATININE: 1.4 mg/dL — AB (ref 0.7–1.3)
Calcium: 9.7 mg/dL (ref 8.4–10.4)
Chloride: 100 mEq/L (ref 98–109)
EGFR: 56 mL/min/{1.73_m2} — AB (ref >90)
GLUCOSE: 94 mg/dL (ref 70–140)
Potassium: 4 mEq/L (ref 3.5–5.1)
SODIUM: 138 meq/L (ref 136–145)
TOTAL PROTEIN: 7.2 g/dL (ref 6.4–8.3)

## 2016-12-01 LAB — TSH: TSH: 2.725 m(IU)/L (ref 0.320–4.118)

## 2016-12-01 NOTE — Progress Notes (Signed)
CLINIC:  Survivorship  REASON FOR VISIT:  Routine follow-up for recent history of head & neck cancer    BRIEF ONCOLOGIC HISTORY:  Oncology History   Stage IVA (T1, N2b, M0) squamous cell carcinoma of base of tongue HPV (+)     Cancer of base of tongue ([place])   12/02/2015 Imaging    Ct neck showed right base of tongue cancer and large right neck cervical lymphadenopathy      12/10/2015 Pathology Results    Accession: [id number] right tongue base and right neck mass biopsy were positive for invasive squamous cell cancer, HPV positive      12/10/2015 Procedure    He underwent laryngoscopy and biopsy of base of tongue and excision of right neck mass      01/11/2016 Imaging    PET scan: Hypermetabolic right tongue lesion without evidence of metastatic disease. A necrotic appearing right level-II lymph node, seen on 12/02/2015, is no longer visualized. 2. 1.8 cm low-attenuation lesion in the left lobe of the thyroid, nonspecific      01/22/2016 Procedure    Gastrostomy tube and port-a-cath placed.      01/25/2016 - 03/15/2016 Radiation Therapy    IMRT to BOT/Bilat neck completed [name] Best). Base of tongue 70 Gy in 35 fractions. High-risk neck nodal echeleons 63 Gy in 35 fractions. Intermediate-risk neck nodal echelons 56 Gy in 35 fractions      01/27/2016 - 01/27/2016 Chemotherapy    He received only 1 dose of cisplatin, complicated by uncontrolled nausea, vomiting, diarrhea and tinnitus. After much discussion, decision was made to discontinue chemotherapy permanently      05/16/2016 Procedure    Gastrostomy tube and port-a-cath removed.      07/07/2016 PET scan    Restaging PET:  1. Resolution of hypermetabolic activity in the RIGHT hypopharynx.  2. No hypermetabolic cervical lymph nodes.  3. No evidence of distant metastatic disease.         INTERVAL HISTORY:  [name] Best returns to clinic for routine follow-up since completing treatment about 9 months ago.   He reports  feeling great. He continues to lose weight, "but I don't understand why I can't gain weight. I eat all of the time."  He eats several meals a day in smaller portions than he could tolerate before his cancer treatments. He feels like his appetite is okay, but it is not great; he attributes his lower appetite with taste changes. He does feel like his taste has improved quite a bit in recent months. His energy levels have been pretty good; he tells me he is not at 100%, and has to rest often but is able to do all the activities he wants to do. He reports having "dizzy spells at times"; it only happens and he is bending over or standing up too quickly.   He has xerostomia, which is worst with meat. He feels like his salivary function is returning a little bit. He occasionally feels like food gets stuck in his throat, but this is alleviated with drinking extra fluids. He feels like he urinates regularly. Denies any hematuria or dysuria.  He has not seen his ENT physician, [name] Best, since his diagnosis.  Patient states that he was planning to call and make an appointment with his office, but has not done it yet.   He has chronic back pain, arthritis, and hip pain. This is managed by an outside provider. He currently rates his pain as a 1-2/10.  24 Hour Diet  Recall: -2 eggs -2 cheesesteak sandwiches with buttered bread -chocolate cake -chicken, cream potatoes, macaroni & cheese, and green beans  -sweet tea and water    Pain: Chronic back and hip pain; managed by outside provider.   Nutritional Status:  -Intake/Diet: Regular diet; tolerating all nutrition by mouth.  -Using a feeding tube?  No -Weight change: LOSS ~ 14 lbs since 07/08/16  Swallowing:  Feels like food "gets stuck sometimes"; is able to clear sensation with drinking plenty of fluids.     ADDITIONAL REVIEW OF SYSTEMS:  Review of Systems  per HPI  A 14-point review of systems performed and is otherwise negative except as stated above.      CURRENT MEDICATIONS:  Outpatient Encounter Prescriptions as of 12/01/2016  Medication Sig  . diazepam (VALIUM) 10 MG tablet Take 1 tablet (10 mg total) by mouth at bedtime as needed for anxiety or sleep. Reported on 02/16/2016  . HYDROcodone-acetaminophen (NORCO) 7.5-325 MG tablet Take 1 tab up to QID as needed for pain.  . pravastatin (PRAVACHOL) 20 MG tablet Take 20 mg by mouth daily. Reported on 03/14/2016  . sodium fluoride (FLUORISHIELD) 1.1 % GEL dental gel Instill one drop of gel per tooth space of tray. Place over teeth for 5 minutes. Remove. Spit out excess. Repeat nightly.  Marland Kitchen acetaminophen (TYLENOL) 500 MG tablet Take 500 mg by mouth every 6 (six) hours as needed.  . [DISCONTINUED] fluconazole (DIFLUCAN) 100 MG tablet Take 1 tablet (100 mg total) by mouth daily. Take 2 tablets today (total 200 mg). Then take 1 tablet daily until all medication is gone. Do not take your Pravastatin while you are taking the Fluconazole. (Patient not taking: Reported on 07/08/2016)  . [DISCONTINUED] lidocaine (XYLOCAINE) 2 % solution Mix 1 part 2%viscous lidocaine,1part H2O.Swish and/or swallow 67mL of this mixture,37min before meals and at bedtime, up to QID (Patient not taking: Reported on 03/14/2016)  . [DISCONTINUED] prochlorperazine (COMPAZINE) 10 MG tablet Take 1 tablet (10 mg total) by mouth every 6 (six) hours as needed for nausea or vomiting. (Patient not taking: Reported on 07/08/2016)  . [DISCONTINUED] sucralfate (CARAFATE) 1 g tablet Dissolve 1 tablet in 23mL H2O and swallow up to QID,PRN sore throat. (Patient not taking: Reported on 03/07/2016)   No facility-administered encounter medications on file as of 12/01/2016.      ALLERGIES:  Allergies  Allergen Reactions  . Tetracyclines & Related Hives     PHYSICAL EXAM:  Vitals:   12/01/16 1300  BP: 132/89  Pulse: 79  Resp: 18  Temp: 97.6 F (36.4 C)   Filed Weights   12/01/16 1300  Weight: 199 lb 9.6 oz (90.5 kg)    Weight Date   199 lb 9.6 oz (90.5 kg)  12/01/16  213 lb 3.2 oz (96.707 kg) 07/08/16  229 lb 14.4 oz (104.282 kg) 05/05/16  231 lb 6.4 oz (104.962 kg) 04/18/16  231 lb 3.2 oz (104.872 kg) 03/30/16  236 lb 8 oz (107.276 kg) 03/18/16  237 lb 14.4 oz (107.911 kg) 03/15/16  240 lb 1.6 oz (108.909 kg) 03/14/16  244 lb 3.2 oz (110.768 kg) 03/07/16  249 lb 14.4 oz (113.354 kg) 02/29/16  250 lb (113.399 kg) 02/22/16  252 lb 3.2 oz (114.397 kg) 02/16/16  258 lb (117.028 kg) 02/08/16  258 lb 3.2 oz (117.119 kg) 02/01/16  264 lb 9.6 oz (120.022 kg) 01/25/16  275 lb 6.4 oz (124.921 kg) 01/13/16  270 lb 9.6 oz (122.743 kg) Pre-treatment: 12/17/15   General: Male  in no acute distress. Accompanied by his wife today.   HEENT: Head is atraumatic and normocephalic.  Conjunctivae clear without exudate.  Sclerae anicteric. Mucus membranes are dry. No palpable tongue, buccal mucosa, or floor of mouth lesions. Mild erythema to posterior oropharynx, consistent with healed mucositis/post-radiation changes.  Lymph: No cervical, supraclavicular, infraclavicular, or axillary lymphadenopathy.  Neck: Skin intact. No open lesions or palpable masses. Mild submental lymphedema.    Cardiovascular: Regular rate and rhythm  Respiratory: Clear to auscultation bilaterally. Non-labored breathing.  GI: Abdomen soft and round. Bowel sounds normoactive.  GU: Deferred.   Neuro: No focal deficits. Steady gait. CN II-XII grossly intact. Mild nystagmus.  Psych: Normal mood and affect for situation. Extremities: No edema.   Skin: Warm and dry.    LABORATORY DATA:  CBC with diff, CMET, TSH, Magnesium, Phosphorus pending.    DIAGNOSTIC IMAGING:  None at this visit.    ASSESSMENT & PLAN:  Mr. Bowlen is a pleasant 55 y.o. male with history of Stage IVA (T1N2bM0) squamous cell carcinoma of the base of tongue, treated with 1 cycle Cisplatin (stopped due to side effects) and radiation therapy; completed treatment on 03/15/16.  Patient presents to survivorship  clinic today for follow-up and continued surveillance.   1. Cancer of the base of tongue:  Mr. Du has no findings suggestive of recurrent disease on physical exam. However, his continued unintentional weight loss is concerning (see #2).   2. Unintentional weight loss:  Mr. Meadors has lost 30+ lbs in the past 7 months, which is concerning.  He has a decent appetite and is eating a variety of foods.  He is not trying to lose weight and "eats all the time."  His physical exam is reassuring with no findings concerning for local recurrence. However, we will get restaging scans with CT chest, abdomen, & pelvis to evaluate for possible metastatic disease.  I discussed his case with [name] Best and she agreed with this plan.  We will collect some basic labs today to ensure there is no metabolic reason for this unintentional weight loss and will proceed with CT scan when that is able to be scheduled.  He prefers to have the scans done at [facility], as he lives in [place]; this is reasonable and we will do our best to accommodate him.  I will also make [name] Best, [name] Best aware of the patient's weight loss, as it may be helpful to bring the patient in to meet with her to see if we can better optimize his caloric intake.    3. Dizziness: His blood pressure was adequate today. We discussed that he certainly could continue to have some orthostatic symptoms and encouraged him to continue to push oral intake of non-caffeinated fluids.  If his restaging scans are suspicious for metastatic disease, then we can consider imaging his brain at that time.  Otherwise, this is likely a benign and expected finding at this point in his recover from head & neck cancer treatments.    4. Oral hygiene: I commended him for using his fluoride trays daily and encouraged him to continue doing those.  He understands why they are important in the setting of xerostomia and increased risk for dental caries. He is doing well with regards to  his oral hygiene.  He is struggling with decreased jaw range of motion. Encouraged him to keep working on his exercises and seeing his dentist as directed.   5. Chronic back/joint pain: Managed by outside provider. Continue current  pain medication regimen.   6. Alcohol/Tobacco: Denies any current tobacco or alcohol use. He understands the increased risk of H&N cancer recurrence and overall health complications with tobacco and alcohol abuse.      Dispo:  -Labs today -Restaging CT chest/abd/pelvis as soon as able at [facility]  -See ENT, [name] Best, in [place] sometime in 02/2017 -Return to cancer center to see Dr. Isidore Best in 06/2017 for continued follow-up    A total of 30 minutes was spent in the care of this patient with greater than 50% of that time spent in counseling and care-coordination.     [name] Craze, NP [place] [phone]

## 2016-12-02 ENCOUNTER — Telehealth: Payer: Self-pay | Admitting: *Deleted

## 2016-12-02 LAB — PHOSPHORUS: PHOSPHORUS: 3.4 mg/dL (ref 2.5–4.5)

## 2016-12-02 NOTE — Telephone Encounter (Signed)
Oncology Nurse Navigator Documentation  Per Survivorship NP Mignon [place] guidance, called ENT Dr. Deeann Saint office, requested patient be contacted to arrange March 2018 post-RT follow-up appt at Dr. Jerilee Field office.  Receptionist voiced understanding.  [name], RN, BSN, [place] Oncology Nurse [place] [facility] [place] [phone]

## 2016-12-05 ENCOUNTER — Telehealth: Payer: Self-pay | Admitting: Emergency Medicine

## 2016-12-05 NOTE — Telephone Encounter (Signed)
Spoke with patient; informed him of lab results per 12/01/16 visit; Instructed patient to drink plenty of fluids; states he has been drinking lots of water and is voiding more so now than ever. Per patient he has appointment scheduled for March/April 2018 with ENT for follow up.   Instructed patient to call for any concerns or questions in the meantime.

## 2016-12-17 ENCOUNTER — Ambulatory Visit: Payer: Commercial Managed Care - HMO | Admitting: Radiation Oncology

## 2016-12-26 IMAGING — XA IR PERC PLACEMENT GASTROSTOMY
1 series · 6 of 6 positions shown · non-contrast
Comparison: none

INDICATION: 54-year-old with tongue cancer. Scheduled for Port-A-Cath placement
and gastrostomy tube.

[Series 300: ir gastrostomy tube mod sed · 6 of 6 slices shown]
[im 1/6]
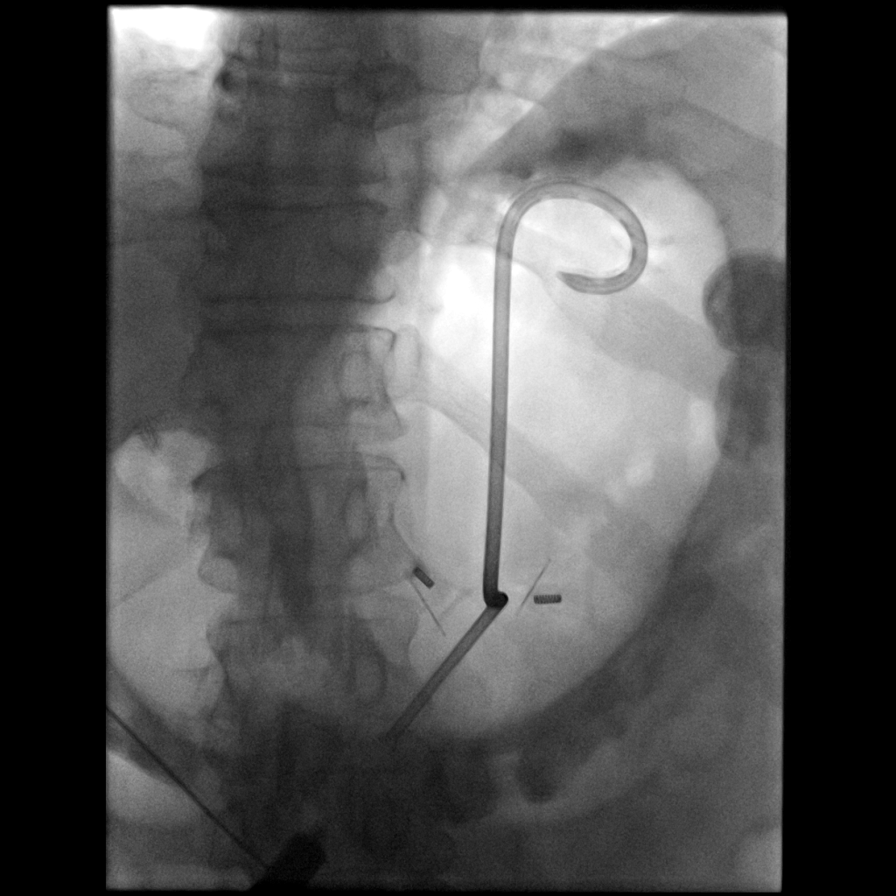
[im 2/6]
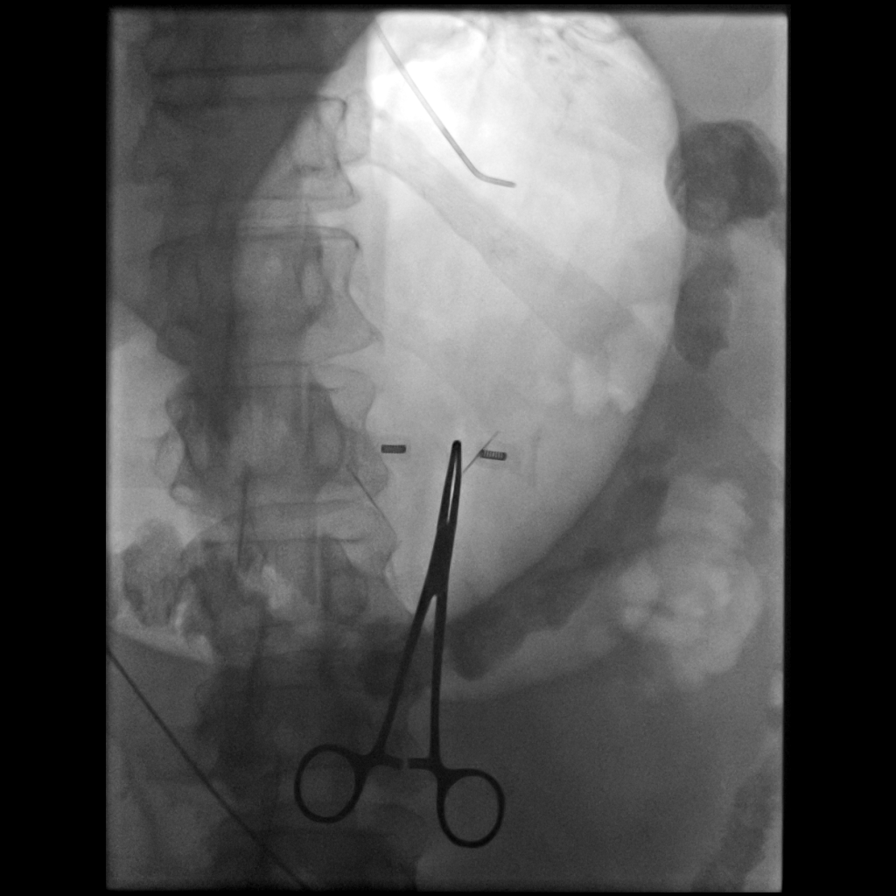
[im 3/6]
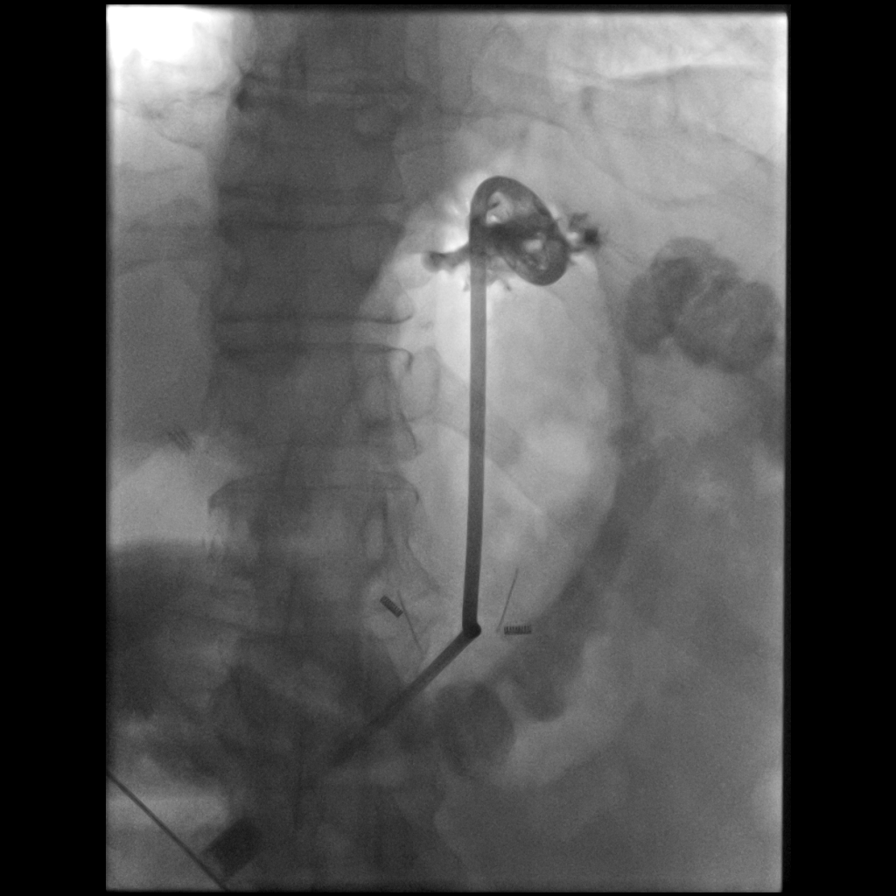
[im 4/6]
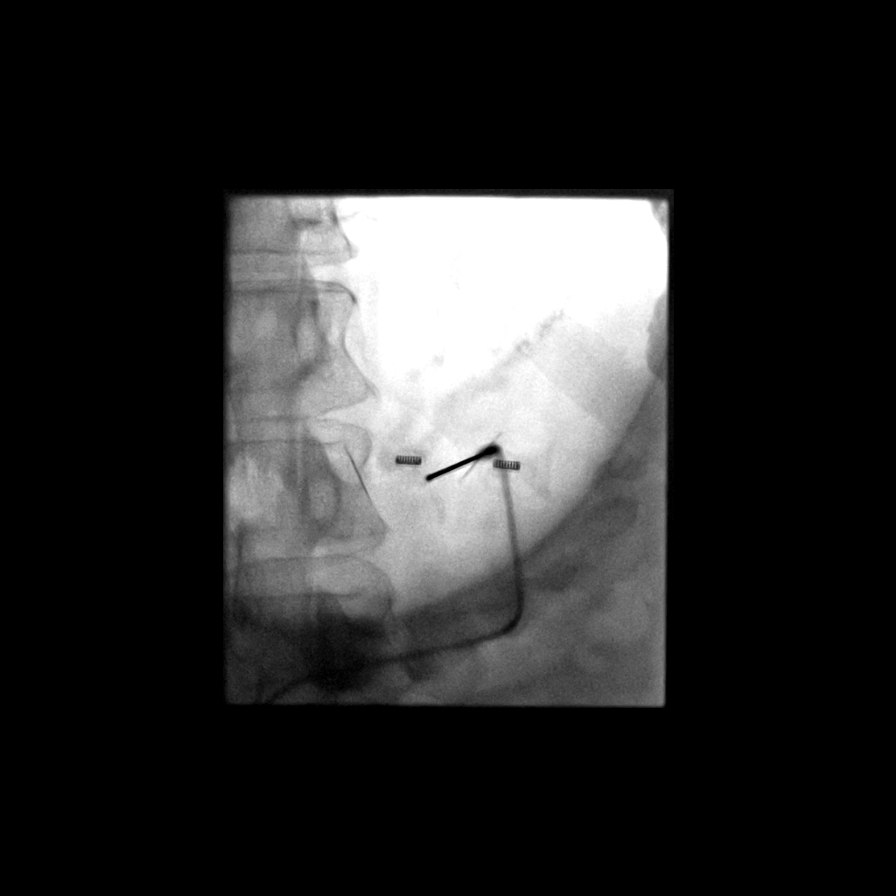
[im 5/6]
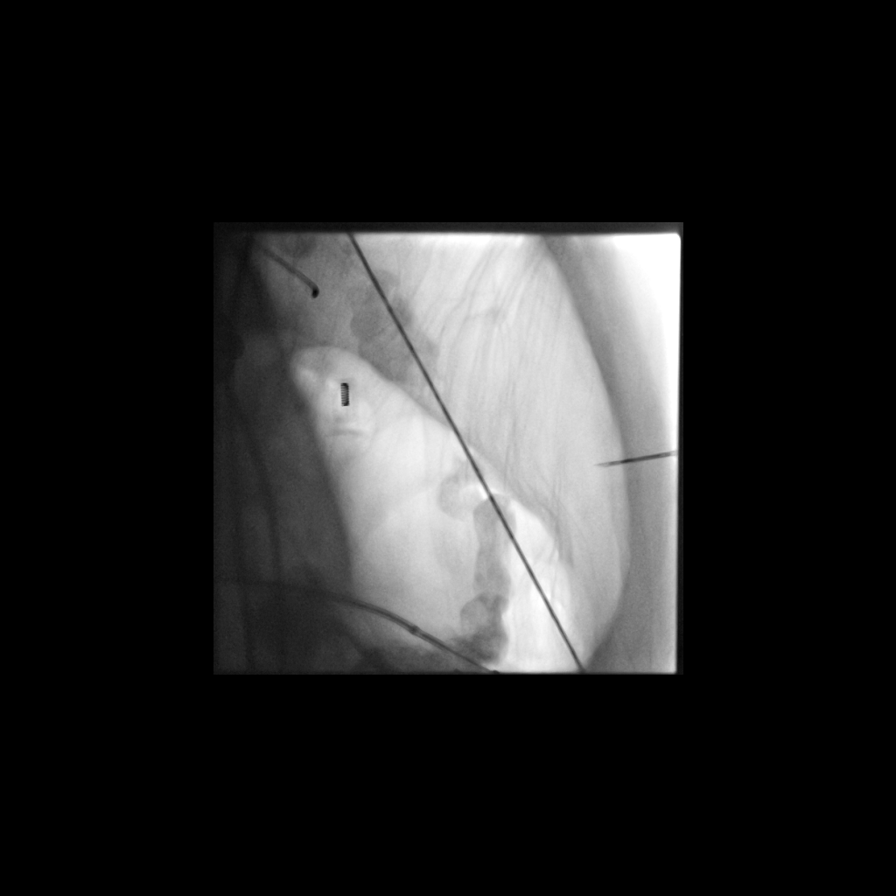
[im 6/6]
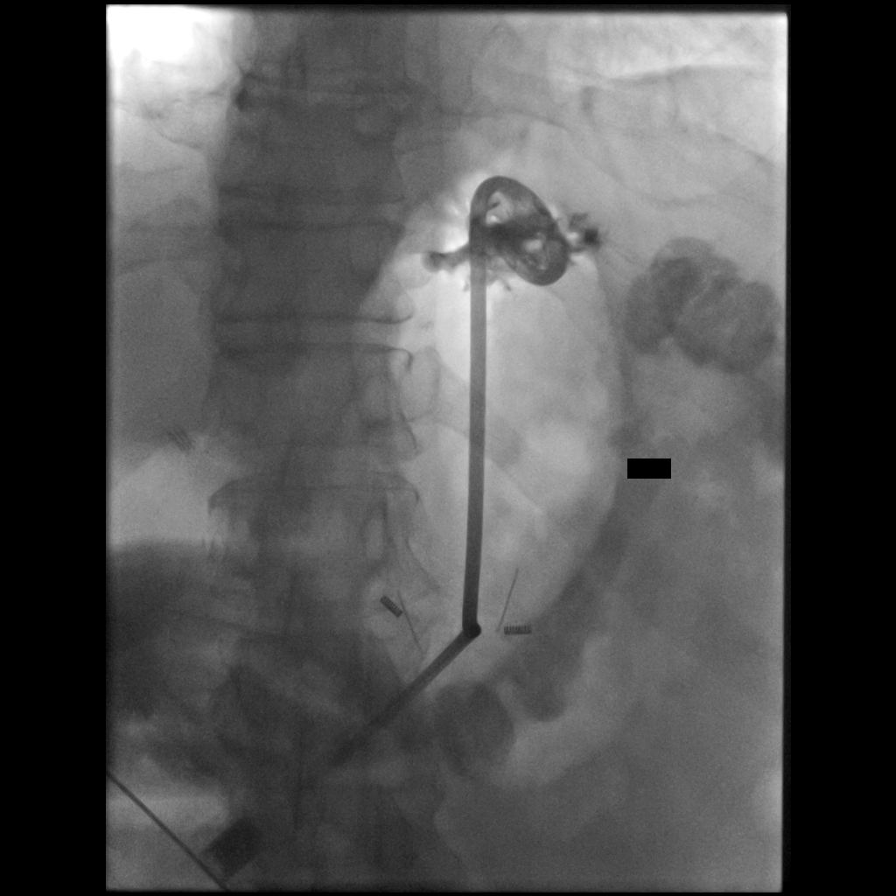

[6 of 6 positions shown; findings below may reference images not displayed]

EXAM:
PERCUTANEOUS GASTROSTOMY TUBE WITH FLUOROSCOPIC GUIDANCE

MEDICATIONS:
2 g Ancef; Antibiotics were administered within 1 hour of the
procedure. Glucagon 1 mg IV

ANESTHESIA/SEDATION:
Versed 3 mg mg IV; Fentanyl 50 mcg IV

Moderate Sedation Time:  24 minutes

The patient was continuously monitored during the procedure by the
interventional radiology nurse under my direct supervision.

FLUOROSCOPY TIME:  Fluoroscopy Time: 3 minutes 42 seconds (68.3
mGy).

CONTRAST:  5 mL Omnipaque 300

COMPLICATIONS:
None immediate.

PROCEDURE:
Informed consent was obtained for a percutaneous gastrostomy tube.
The patient was placed on the interventional table. Fluoroscopy
demonstrated oral contrast in the transverse colon. An orogastric
tube was placed with fluoroscopic guidance. The anterior abdomen was
prepped and draped in sterile fashion. Maximal barrier sterile
technique was utilized including caps, mask, sterile gowns, sterile
gloves, sterile drape, hand hygiene and skin antiseptic. Stomach was
inflated with air through the orogastric tube. The skin and
subcutaneous tissues were anesthetized with 1% lidocaine. A 17 gauge
needle was directed into the distended stomach with fluoroscopic
guidance. A wire was advanced into the stomach and a gastropexy T
fastener was deployed. A second T fastener was placed in a similar
fashion. 17 gauge needle was directed into the stomach between the
T-fasteners with fluoroscopy. A stiff Amplatz wire was advanced. The
tract was dilated to accommodate the 14 French gastrostomy tube.
Small amount of contrast was injected to confirm placement in the
stomach. Fluoroscopic images were obtained for documentation.
IMPRESSION: Successful fluoroscopic guided percutaneous gastrostomy tube
placement.

The gastropexy T-fasteners will need to be removed in 10-14 days.

## 2016-12-28 ENCOUNTER — Encounter: Payer: Self-pay | Admitting: Internal Medicine

## 2016-12-29 ENCOUNTER — Ambulatory Visit (INDEPENDENT_AMBULATORY_CARE_PROVIDER_SITE_OTHER): Payer: Commercial Managed Care - HMO | Admitting: Orthopaedic Surgery

## 2016-12-29 ENCOUNTER — Encounter: Payer: Self-pay | Admitting: Orthopaedic Surgery

## 2016-12-29 VITALS — BP 122/89 | HR 70 | Temp 97.3°F | Ht 74.5 in | Wt 204.0 lb

## 2016-12-29 DIAGNOSIS — G8929 Other chronic pain: Secondary | ICD-10-CM

## 2016-12-29 DIAGNOSIS — M25562 Pain in left knee: Secondary | ICD-10-CM

## 2016-12-29 NOTE — Progress Notes (Signed)
Patient [id number] [name] Best, male DOB:07/02/1961, 56 y.o. [id number]  Chief Complaint  Patient presents with  . Knee Pain    left    HPI  PRESS GALESKI is a 56 y.o. male who has left knee pain. He has good days and bad days.  He has been active.  He has some swelling and popping at times.  He has no new trauma. HPI  Body mass index is 25.84 kg/m.  ROS  Review of Systems  HENT: Negative for congestion.   Respiratory: Negative for cough and shortness of breath.   Cardiovascular: Negative for leg swelling.  Endocrine: Negative for cold intolerance.  Musculoskeletal: Positive for arthralgias, gait problem and joint swelling.  Allergic/Immunologic: Negative for environmental allergies.  Psychiatric/Behavioral: The patient is nervous/anxious.     Past Medical History:  Diagnosis Date  . Anxiety   . C. difficile diarrhea 2014   treated with Flagyl  . Cancer ([place])   . Chronic back pain   . Diverticulitis    hospitalized in 2003  . Hx of radiation therapy 01/25/16- 03/15/16   Base of Tongue, bilateral neck  . Hyperlipidemia   . PONV (postoperative nausea and vomiting)     Past Surgical History:  Procedure Laterality Date  . CHOLECYSTECTOMY    . COLONOSCOPY N/A 07/02/2013   Jenkins:Sessile polyp (tubular adenoma) ranging between 3-37mm in size in the proximal sigmoid colon/mild diverticulosis   . COLONOSCOPY     Dr. Laural Golden: prior to 2003 per patient  . DIRECT LARYNGOSCOPY N/A 12/10/2015   Procedure: DIRECT LARYNGOSCOPY;  Surgeon: Leta Baptist, MD;  Location: [facility];  Service: ENT;  Laterality: N/A;  . ESOPHAGOGASTRODUODENOSCOPY N/A 07/02/2013   Jenkins:4 cm hiatal hernia/esophagus appeared normal/chronic gastritis. Clotest negative.  Marland Kitchen GALLBLADDER SURGERY    . KNEE ARTHROSCOPY WITH MEDIAL MENISECTOMY Left 04/14/2015   Procedure: KNEE ARTHROSCOPY WITH MEDIAL MENISECTOMY;  Surgeon: Sanjuana Kava, MD;  Location: [facility];  Service: Orthopedics;  Laterality: Left;  .  left elbow    . MASS BIOPSY Right 12/10/2015   Procedure: RIGHT EXCISIONAL NECK MASS BIOPSY;  Surgeon: Leta Baptist, MD;  Location: [facility];  Service: ENT;  Laterality: Right;  . TONGUE BIOPSY Right 12/10/2015   Procedure: TONGUE BIOPSY;  Surgeon: Leta Baptist, MD;  Location: [facility];  Service: ENT;  Laterality: Right;    Family History  Problem Relation Age of Onset  . Heart disease    . Colon cancer Neg Hx   . Colon polyps Neg Hx   . Heart failure Mother 52  . Heart attack Father 56  . Cancer Paternal Uncle     died of cancer    Social History Social History  Substance Use Topics  . Smoking status: Never Smoker  . Smokeless tobacco: Never Used  . Alcohol use No    Allergies  Allergen Reactions  . Tetracyclines & Related Hives    Current Outpatient Prescriptions  Medication Sig Dispense Refill  . acetaminophen (TYLENOL) 500 MG tablet Take 500 mg by mouth every 6 (six) hours as needed.    . diazepam (VALIUM) 10 MG tablet Take 1 tablet (10 mg total) by mouth at bedtime as needed for anxiety or sleep. Reported on 02/16/2016 30 tablet 0  . HYDROcodone-acetaminophen (NORCO) 7.5-325 MG tablet Take 1 tab up to QID as needed for pain. 30 tablet 0  . pravastatin (PRAVACHOL) 20 MG tablet Take 20 mg by mouth daily. Reported on 03/14/2016    .  sodium fluoride (FLUORISHIELD) 1.1 % GEL dental gel Instill one drop of gel per tooth space of tray. Place over teeth for 5 minutes. Remove. Spit out excess. Repeat nightly. 120 mL prn   No current facility-administered medications for this visit.      Physical Exam  Blood pressure 122/89, pulse 70, temperature 97.3 F (36.3 C), height 6' 2.5" (1.892 m), weight 204 lb (92.5 kg).  Constitutional: overall normal hygiene, normal nutrition, well developed, normal grooming, normal body habitus. Assistive device:none  Musculoskeletal: gait and station Limp left, muscle tone and strength are normal, no tremors or atrophy  is present.  .  Neurological: coordination overall normal.  Deep tendon reflex/nerve stretch intact.  Sensation normal.  Cranial nerves II-XII intact.   Skin:   Normal overall no scars, lesions, ulcers or rashes. No psoriasis.  Psychiatric: Alert and oriented x 3.  Recent memory intact, remote memory unclear.  Normal mood and affect. Well groomed.  Good eye contact.  Cardiovascular: overall no swelling, no varicosities, no edema bilaterally, normal temperatures of the legs and arms, no clubbing, cyanosis and good capillary refill.  Lymphatic: palpation is normal. The left lower extremity is examined:  Inspection:  Thigh:  Non-tender and no defects  Knee has swelling 1/2 + effusion.                        Joint tenderness is present                        Patient is tender over the medial joint line  Lower Leg:  Has normal appearance and no tenderness or defects  Ankle:  Non-tender and no defects  Foot:  Non-tender and no defects Range of Motion:  Knee:  Range of motion is: 0-115                        Crepitus is  present  Ankle:  Range of motion is normal. Strength and Tone:  The left lower extremity has normal strength and tone. Stability:  Knee:  The knee is stable.  Ankle:  The ankle is stable.     The patient has been educated about the nature of the problem(s) and counseled on treatment options.  The patient appeared to understand what I have discussed and is in agreement with it.  Encounter Diagnosis  Name Primary?  . Chronic pain of left knee Yes    PLAN Call if any problems.  Precautions discussed.  Continue current medications.   Return to clinic PRN   Electronically Signed Sanjuana Kava, MD 1/4/20188:55 AM

## 2017-03-02 ENCOUNTER — Ambulatory Visit (INDEPENDENT_AMBULATORY_CARE_PROVIDER_SITE_OTHER): Payer: Commercial Managed Care - HMO | Admitting: Otolaryngology

## 2017-03-02 DIAGNOSIS — Z85818 Personal history of malignant neoplasm of other sites of lip, oral cavity, and pharynx: Secondary | ICD-10-CM | POA: Diagnosis not present

## 2017-05-30 ENCOUNTER — Telehealth: Payer: Self-pay | Admitting: *Deleted

## 2017-05-30 NOTE — Telephone Encounter (Signed)
Called patient to alter lab and fu on 07-14-17, appts. moved to 07-21-17 , patient's mother - Lelynd Poer  agreed to this

## 2017-06-30 ENCOUNTER — Other Ambulatory Visit: Payer: Self-pay

## 2017-06-30 ENCOUNTER — Ambulatory Visit (INDEPENDENT_AMBULATORY_CARE_PROVIDER_SITE_OTHER): Payer: 59 | Admitting: Gastroenterology

## 2017-06-30 ENCOUNTER — Encounter: Payer: Self-pay | Admitting: Gastroenterology

## 2017-06-30 ENCOUNTER — Telehealth: Payer: Self-pay

## 2017-06-30 VITALS — BP 123/84 | HR 75 | Temp 97.1°F | Ht 74.5 in | Wt 216.8 lb

## 2017-06-30 DIAGNOSIS — R195 Other fecal abnormalities: Secondary | ICD-10-CM | POA: Diagnosis not present

## 2017-06-30 DIAGNOSIS — D126 Benign neoplasm of colon, unspecified: Secondary | ICD-10-CM

## 2017-06-30 DIAGNOSIS — Z86010 Personal history of colonic polyps: Secondary | ICD-10-CM

## 2017-06-30 MED ORDER — HYDROCORTISONE 2.5 % RE CREA: 1.0000 "application " | TOPICAL_CREAM | Freq: Two times a day (BID) | RECTAL | 1 refills | Status: DC

## 2017-06-30 MED ORDER — NA SULFATE-K SULFATE-MG SULF 17.5-3.13-1.6 GM/177ML PO SOLN: 1.0000 | ORAL | 0 refills | Status: DC

## 2017-06-30 NOTE — Progress Notes (Signed)
Primary Care Physician:  Sharilyn Sites, MD Primary Gastroenterologist:  Dr. Gala Romney   Chief Complaint  Patient presents with  . Blood In Stools    HPI:   [name] Best is a 56 y.o. male presenting with a history of CDI several years ago, treated with Flagyl and did well. History of colonic adenomas in 2014 and due for surveillance now. 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 Will be seeing oncologist end of this month.   States since his diagnosis of cancer, he has no tastebuds. Reports heme positive stool through PCP on "one test". Has burning and itching in rectum at night, which goes away spontaneously. Present just this year. No overt GI bleeding. Had diarrhea last day or two but thinks it is something he ate. Eating a lot of red meat. BM usually once a day. Sometimes every other day. He had steadily lost weight since cancer diagnosis; however, his weight appears to be increasing from Dec of this past year.   Past Medical History:  Diagnosis Date  . Anxiety   . C. difficile diarrhea 2014   treated with Flagyl  . Cancer ([place])   . Chronic back pain   . Diverticulitis    hospitalized in 2003  . Hx of radiation therapy 01/25/16- 03/15/16   Base of Tongue, bilateral neck  . Hyperlipidemia   . PONV (postoperative nausea and vomiting)     Past Surgical History:  Procedure Laterality Date  . CHOLECYSTECTOMY    . COLONOSCOPY N/A 07/02/2013   Jenkins:Sessile polyp (tubular adenoma) ranging between 3-76mm in size in the proximal sigmoid colon/mild diverticulosis   . COLONOSCOPY     Dr. Laural Golden: prior to 2003 per patient  . DIRECT LARYNGOSCOPY N/A 12/10/2015   Procedure: DIRECT LARYNGOSCOPY;  Surgeon: Leta Baptist, MD;  Location: [facility];  Service: ENT;  Laterality: N/A;  . ESOPHAGOGASTRODUODENOSCOPY N/A 07/02/2013   Jenkins:4 cm hiatal hernia/esophagus appeared  normal/chronic gastritis. Clotest negative.  Marland Kitchen GASTROSTOMY TUBE CHANGE     placement and removal during chemo/radiation. no longer present  . KNEE ARTHROSCOPY WITH MEDIAL MENISECTOMY Left 04/14/2015   Procedure: KNEE ARTHROSCOPY WITH MEDIAL MENISECTOMY;  Surgeon: Sanjuana Kava, MD;  Location: [facility];  Service: Orthopedics;  Laterality: Left;  . left elbow    . MASS BIOPSY Right 12/10/2015   Procedure: RIGHT EXCISIONAL NECK MASS BIOPSY;  Surgeon: Leta Baptist, MD;  Location: [facility];  Service: ENT;  Laterality: Right;  . TONGUE BIOPSY Right 12/10/2015   Procedure: TONGUE BIOPSY;  Surgeon: Leta Baptist, MD;  Location: [facility];  Service: ENT;  Laterality: Right;    Current Outpatient Prescriptions  Medication Sig Dispense Refill  . acetaminophen (TYLENOL) 500 MG tablet Take 500 mg by mouth every 6 (six) hours as needed.    . diazepam (VALIUM) 10 MG tablet Take 1 tablet (10 mg total) by mouth at bedtime as needed for anxiety or sleep. Reported on 02/16/2016 30 tablet 0  . HYDROcodone-acetaminophen (NORCO) 7.5-325 MG tablet Take 1 tab up to QID as needed for pain. 30 tablet 0  . pravastatin (PRAVACHOL) 20 MG tablet Take 20 mg by mouth daily. Reported on 03/14/2016    . sodium fluoride (FLUORISHIELD) 1.1 % GEL dental gel Instill one drop of gel per tooth space of tray. Place over teeth for 5 minutes. Remove. Spit out excess.  Repeat nightly. 120 mL prn  . hydrocortisone (ANUSOL-HC) 2.5 % rectal cream Place 1 application rectally 2 (two) times daily. 30 g 1   No current facility-administered medications for this visit.     Allergies as of 06/30/2017 - Review Complete 06/30/2017  Allergen Reaction Noted  . Tetracyclines & related Hives 08/16/2011    Family History  Problem Relation Age of Onset  . Heart failure Mother 108  . Heart attack Father 27  . Heart disease Unknown   . Cancer Paternal Uncle        died of cancer  . Colon cancer Neg Hx   . Colon polyps Neg Hx       Social History   Social History  . Marital status: Married    Spouse name: N/A  . Number of children: 2  . Years of education: 12th grade   Occupational History  . retired    Social History Main Topics  . Smoking status: Never Smoker  . Smokeless tobacco: Never Used  . Alcohol use No  . Drug use: Yes    Types: Marijuana     Comment: "sometimes for appetite"  . Sexual activity: Not on file     Comment: married, retired, 1 son & 1 daughter   Other Topics Concern  . Not on file   Social History Narrative   Lives with mother.      Review of Systems: Gen: see HP CV: Denies chest pain, heart palpitations, peripheral edema, syncope.  Resp: Denies shortness of breath at rest or with exertion. Denies wheezing or cough.  GI: see HPI  GU : Denies urinary burning, urinary frequency, urinary hesitancy MS: chronic back pain  Derm: Denies rash, itching, dry skin Psych: Denies depression, anxiety, memory loss, and confusion Heme: see HPI   Physical Exam: BP 123/84   Pulse 75   Temp (!) 97.1 F (36.2 C) (Oral)   Ht 6' 2.5" (1.892 m)   Wt 216 lb 12.8 oz (98.3 kg)   BMI 27.46 kg/m  General:   Alert and oriented. Pleasant and cooperative. Well-nourished and well-developed.  Head:  Normocephalic and atraumatic. Eyes:  Without icterus, sclera clear and conjunctiva pink.  Ears:  Normal auditory acuity. Nose:  No deformity, discharge,  or lesions. Mouth:  No deformity or lesions, oral mucosa pink.  Lungs:  Clear to auscultation bilaterally. No wheezes, rales, or rhonchi. No distress.  Heart:  S1, S2 present without murmurs appreciated.  Abdomen:  +BS, soft, non-tender and non-distended. No HSM noted. No guarding or rebound. No masses appreciated.  Rectal:  No obvious external abnormalities. Internal exam without mass. No overt blood noted. Query internal hemorrhoids Msk:  Symmetrical without gross deformities. Normal posture. Extremities:  Without  edema. Neurologic:  Alert  and  oriented x4 Psych:  Alert and cooperative. Normal mood and affect.  Lab Results  Component Value Date   WBC 4.9 12/01/2016   HGB 14.4 12/01/2016   HCT 44.6 12/01/2016   MCV 88.1 12/01/2016   PLT 203 12/01/2016

## 2017-06-30 NOTE — Telephone Encounter (Signed)
Tried to call pt to inform of pre-op appt 08/01/17 at 11:00am. His mother said he was not home. Letter mailed to pt with pre-op appt.

## 2017-06-30 NOTE — Patient Instructions (Addendum)
I have sent in a cream to apply per rectum twice a day for 7 days. Let me know if this is not covered.  We have scheduled you for a colonoscopy with Dr. Gala Romney in the near future!

## 2017-06-30 NOTE — Patient Instructions (Signed)
PA info for TCS submitted via [facility] website. Case approved. PA# [id number].

## 2017-06-30 NOTE — Assessment & Plan Note (Addendum)
56 year old male with history of adenomas in 2014 by Dr. Arnoldo Morale, due for surveillance now per plan. Reportedly also heme positive recently through PCP's office. No overt GI bleeding or significant change in bowel habits. He was diagnosed with squamous cell carcinoma base of tongue in Dec 2016, undergoing chemo and radiation in interim from last visit several years ago. Due for follow-up with oncology the end of July, last seen Dec 2017. Rectal discomfort, itching, likely secondary to internal hemorrhoids.   Proceed with TCS with Dr. Gala Romney in near future: the risks, benefits, and alternatives have been discussed with the patient in detail. The patient states understanding and desires to proceed. PROPOFOL due to polypharmacy Anusol cream BID Further recommendations to follow

## 2017-07-03 NOTE — Progress Notes (Signed)
cc'ed to pcp °

## 2017-07-12 NOTE — Progress Notes (Signed)
[name] Best presents for follow up of radiation completed 03/15/16 to his Base of tongue and bilateral neck.   Pain issues, if any: He has chronic pain to his Left Hip. He takes hydrocodone for this pain Using a feeding tube?: Removed Weight changes, if any:  Wt Readings from Last 3 Encounters:  07/21/17 216 lb 12.8 oz (98.3 kg)  06/30/17 216 lb 12.8 oz (98.3 kg)  12/29/16 204 lb (92.5 kg)   Swallowing issues, if any: His swallowing has improved. He does have difficulty with meat and bread. It gets stuck in his throat.  Smoking or chewing tobacco? No Using fluoride trays daily? Yes Last ENT visit was on: Dr. Benjamine Mola 02/2017 Other notable issues, if any:  He reports a decreased appetite. Eats small portions at a time. He has mild fatigue. He does have dizziness at times  BP (!) 155/110   Temp 97.8 F (36.6 C)   Ht 6' 2.5" (1.892 m)   Wt 216 lb 12.8 oz (98.3 kg)   SpO2 99% Comment: room air  BMI 27.46 kg/m

## 2017-07-14 ENCOUNTER — Ambulatory Visit: Payer: 59 | Admitting: Radiation Oncology

## 2017-07-14 ENCOUNTER — Ambulatory Visit: Payer: 59

## 2017-07-14 DIAGNOSIS — Z881 Allergy status to other antibiotic agents status: Secondary | ICD-10-CM | POA: Insufficient documentation

## 2017-07-14 DIAGNOSIS — Z923 Personal history of irradiation: Secondary | ICD-10-CM | POA: Insufficient documentation

## 2017-07-14 DIAGNOSIS — M25552 Pain in left hip: Secondary | ICD-10-CM | POA: Insufficient documentation

## 2017-07-14 DIAGNOSIS — Z85810 Personal history of malignant neoplasm of tongue: Secondary | ICD-10-CM | POA: Insufficient documentation

## 2017-07-14 DIAGNOSIS — G8929 Other chronic pain: Secondary | ICD-10-CM | POA: Insufficient documentation

## 2017-07-14 DIAGNOSIS — Z08 Encounter for follow-up examination after completed treatment for malignant neoplasm: Secondary | ICD-10-CM | POA: Insufficient documentation

## 2017-07-14 DIAGNOSIS — Z79899 Other long term (current) drug therapy: Secondary | ICD-10-CM | POA: Insufficient documentation

## 2017-07-21 ENCOUNTER — Ambulatory Visit
Admission: RE | Admit: 2017-07-21 | Discharge: 2017-07-21 | Disposition: A | Discharge status: Home / Self Care | Payer: 59 | Source: Ambulatory Visit | Attending: Radiation Oncology | Admitting: Radiation Oncology

## 2017-07-21 ENCOUNTER — Encounter: Payer: Self-pay | Admitting: Radiation Oncology

## 2017-07-21 VITALS — BP 155/110 | Temp 97.8°F | Ht 74.5 in | Wt 216.8 lb

## 2017-07-21 DIAGNOSIS — Z79899 Other long term (current) drug therapy: Secondary | ICD-10-CM | POA: Diagnosis not present

## 2017-07-21 DIAGNOSIS — Z08 Encounter for follow-up examination after completed treatment for malignant neoplasm: Secondary | ICD-10-CM | POA: Diagnosis present

## 2017-07-21 DIAGNOSIS — C01 Malignant neoplasm of base of tongue: Secondary | ICD-10-CM

## 2017-07-21 DIAGNOSIS — M25552 Pain in left hip: Secondary | ICD-10-CM | POA: Diagnosis not present

## 2017-07-21 DIAGNOSIS — Z85810 Personal history of malignant neoplasm of tongue: Secondary | ICD-10-CM | POA: Diagnosis not present

## 2017-07-21 DIAGNOSIS — Z923 Personal history of irradiation: Secondary | ICD-10-CM | POA: Diagnosis not present

## 2017-07-21 DIAGNOSIS — Z881 Allergy status to other antibiotic agents status: Secondary | ICD-10-CM | POA: Diagnosis not present

## 2017-07-21 DIAGNOSIS — G8929 Other chronic pain: Secondary | ICD-10-CM | POA: Diagnosis not present

## 2017-07-21 LAB — TSH: TSH: 2.221 m[IU]/L (ref 0.320–4.118)

## 2017-07-21 NOTE — Progress Notes (Addendum)
Radiation Oncology         [phone] ________________________________  Name: [name] Best MRN: [id number]  Date: 07/21/2017  DOB: October 23, 1961  Follow-Up Visit Note  CC: Sharilyn Sites, MD  Heath Lark, MD  Diagnosis and Prior Radiotherapy:       ICD-10-CM   1. Cancer of base of tongue ([place]) C01 TSH   Stage IVA T1N2bM0 squamous cell carcinoma of the base of tongue, HPV (+)  Radiation Treatment Dates: 01/25/16 - 03/15/16 Site/dose: Base of tongue, bilateral neck treated to 70 Gy in 35 fractions   CHIEF COMPLAINT:  Here for follow-up and surveillance of tongue cancer  Narrative:  The patient returns today for routine follow-up. He is accompanied by his wife.  He reports having no pain issues except for chronic pain to his left hip x many years, relieved by hydrocodone.   His swallowing and taste have improved but reports that he has some mouth dryness in the mornings.  He also reports a decreased appetite and is eating small portions. He has mild fatigue and dizziness at times.     Overall feels well.  He is pleased with how he is doing.   ALLERGIES:  is allergic to tetracyclines & related.  Meds: Current Outpatient Prescriptions  Medication Sig Dispense Refill  . diazepam (VALIUM) 10 MG tablet Take 1 tablet (10 mg total) by mouth at bedtime as needed for anxiety or sleep. Reported on 02/16/2016 30 tablet 0  . HYDROcodone-acetaminophen (NORCO) 7.5-325 MG tablet Take 1 tab up to QID as needed for pain. 30 tablet 0  . hydrocortisone (ANUSOL-HC) 2.5 % rectal cream Place 1 application rectally 2 (two) times daily. 30 g 1  . pravastatin (PRAVACHOL) 20 MG tablet Take 20 mg by mouth daily. Reported on 03/14/2016    . sodium fluoride (FLUORISHIELD) 1.1 % GEL dental gel Instill one drop of gel per tooth space of tray. Place over teeth for 5 minutes. Remove. Spit out excess. Repeat nightly. 120 mL prn  . acetaminophen (TYLENOL) 500 MG tablet Take 500 mg by mouth every 6 (six) hours as  needed.    . Na Sulfate-K Sulfate-Mg Sulf (SUPREP BOWEL PREP KIT) 17.5-3.13-1.6 GM/180ML SOLN Take 1 kit by mouth as directed. (Patient not taking: Reported on 07/21/2017) 1 Bottle 0   No current facility-administered medications for this encounter.     Physical Findings: Wt Readings from Last 3 Encounters:  07/21/17 216 lb 12.8 oz (98.3 kg)  06/30/17 216 lb 12.8 oz (98.3 kg)  12/29/16 204 lb (92.5 kg)    height is 6' 2.5" (1.892 m) and weight is 216 lb 12.8 oz (98.3 kg). His temperature is 97.8 F (36.6 C). His blood pressure is 155/110 (abnormal). His oxygen saturation is 99%. .  General: Alert and oriented, in no acute distress HEENT: Head is normocephalic. Oropharynx and oral cavity are clear. Neck: Neck is notable for lymphedema in anterior neck. No masses. Skin: Skin in treatment fields shows satisfactory healing. Heart: Regular in rate and rhythm with no murmurs, rubs, or gallops. Chest: Clear to auscultation bilaterally, with no rhonchi, wheezes, or rales. Abdomen: Soft, nontender, nondistended, with no rigidity or guarding. Lymphatics: see Neck Exam    Lab Findings: Lab Results  Component Value Date   WBC 4.9 12/01/2016   HGB 14.4 12/01/2016   HCT 44.6 12/01/2016   MCV 88.1 12/01/2016   PLT 203 12/01/2016    Lab Results  Component Value Date   TSH 2.221 07/21/2017  Radiographic Findings: No results found.  Impression/Plan:    1) Head and Neck Cancer Status: NED. See Dr. Benjamine Mola in 4 months.  See Dr. Alvy Bimler 4 months after that.  2) Nutritional Status: Decreased appetite. But, increased weight. PEG tube: Removed on 05/16/2016  3) Risk Factors: No tobacco use.  4) Swallowing: His swallowing has improved, but he has difficulty with meat and bread, which get stuck in his throat.  5) Dental: He is using fluoride trays daily.   6) Thyroid function: Normal today.  Patient instructed to continue to screen for this yearly.  This can be done with his PCP.  I'll  ask [name], RN, our Head and Neck Oncology Navigator to remind him of this with a phone call next week..  Lab Results  Component Value Date   TSH 2.221 07/21/2017    7) Other: Continue to monitor blood pressure. If consistently high, contact PCP.  8) I will see him back as needed. The patient was encouraged to call with any issues or questions before then. Continue follow up with medical oncology and ENT.  I spent 20 minutes minutes face to face with the patient and more than 50% of that time was spent in counseling and/or coordination of care. _____________________________________   Eppie Gibson, MD  This document serves as a record of services personally performed by Eppie Gibson, MD. It was created on her behalf by Rae Lips, a trained medical scribe. The creation of this record is based on the scribe's personal observations and the provider's statements to them. This document has been checked and approved by the attending provider.

## 2017-07-24 ENCOUNTER — Telehealth: Payer: Self-pay | Admitting: *Deleted

## 2017-07-24 NOTE — Telephone Encounter (Signed)
Oncology Nurse Navigator Documentation  Per Dr. Pearlie Oyster guidance, called ENT Dr. Deeann Saint office to arrange appt.   Spoke with Radonna Ricker, requested patient be contacted and appt arranged for routine follow-up with Dr. Benjamine Mola in 4 months.  She verbalized understanding.  [name], RN, BSN, [place] [facility] Oncology Nurse [place] at [place] [phone]

## 2017-07-24 NOTE — Telephone Encounter (Signed)
Oncology Nurse Navigator Documentation  Called [name] Best to inform results of TSH WNL, he should continue to have checked annually by PCP d/t risk of hypothyroidism in future r/t radiation tmt.  He stated he has already been contacted by ENT Dr. Deeann Saint office for f/u appt in 4 months.   He understands he will have f/u with Dr. Alvy Bimler in 8 months. He knows to call me with questions/concerns.  [name], RN, BSN, [place] [facility] Oncology Nurse [place] [facility] [place] [phone]

## 2017-07-27 NOTE — Patient Instructions (Signed)
[name] Best  07/27/2017     @PREFPERIOPPHARMACY @   Your procedure is scheduled on 08/07/2017.  Report to [facility] at 11:00 A.M.  Call this number if you have problems the morning of surgery:  [phone]   Remember:  Do not eat food or drink liquids after midnight.  Take these medicines the morning of surgery with A SIP OF WATER Valium and Hydrocodone if needed   Do not wear jewelry, make-up or nail polish.  Do not wear lotions, powders, or perfumes, or deoderant.  Do not shave 48 hours prior to surgery.  Men may shave face and neck.  Do not bring valuables to the hospital.  [facility] is not responsible for any belongings or valuables.  Contacts, dentures or bridgework may not be worn into surgery.  Leave your suitcase in the car.  After surgery it may be brought to your room.  For patients admitted to the hospital, discharge time will be determined by your treatment team.  Patients discharged the day of surgery will not be allowed to drive home.    Please read over the following fact sheets that you were given. Anesthesia Post-op Instructions     PATIENT INSTRUCTIONS POST-ANESTHESIA  IMMEDIATELY FOLLOWING SURGERY:  Do not drive or operate machinery for the first twenty four hours after surgery.  Do not make any important decisions for twenty four hours after surgery or while taking narcotic pain medications or sedatives.  If you develop intractable nausea and vomiting or a severe headache please notify your doctor immediately.  FOLLOW-UP:  Please make an appointment with your surgeon as instructed. You do not need to follow up with anesthesia unless specifically instructed to do so.  WOUND CARE INSTRUCTIONS (if applicable):  Keep a dry clean dressing on the anesthesia/puncture wound site if there is drainage.  Once the wound has quit draining you may leave it open to air.  Generally you should leave the bandage intact for twenty four hours unless there is drainage.   If the epidural site drains for more than 36-48 hours please call the anesthesia department.  QUESTIONS?:  Please feel free to call your physician or the hospital operator if you have any questions, and they will be happy to assist you.      Colonoscopy, Adult A colonoscopy is an exam to look at the entire large intestine. During the exam, a lubricated, bendable tube is inserted into the anus and then passed into the rectum, colon, and other parts of the large intestine. A colonoscopy is often done as a part of normal colorectal screening or in response to certain symptoms, such as anemia, persistent diarrhea, abdominal pain, and blood in the stool. The exam can help screen for and diagnose medical problems, including:  Tumors.  Polyps.  Inflammation.  Areas of bleeding.  Tell a health care provider about:  Any allergies you have.  All medicines you are taking, including vitamins, herbs, eye drops, creams, and over-the-counter medicines.  Any problems you or family members have had with anesthetic medicines.  Any blood disorders you have.  Any surgeries you have had.  Any medical conditions you have.  Any problems you have had passing stool. What are the risks? Generally, this is a safe procedure. However, problems may occur, including:  Bleeding.  A tear in the intestine.  A reaction to medicines given during the exam.  Infection (rare).  What happens before the procedure? Eating and drinking restrictions Follow instructions from your health  care provider about eating and drinking, which may include:  A few days before the procedure - follow a low-fiber diet. Avoid nuts, seeds, dried fruit, raw fruits, and vegetables.  1-3 days before the procedure - follow a clear liquid diet. Drink only clear liquids, such as clear broth or bouillon, black coffee or tea, clear juice, clear soft drinks or sports drinks, gelatin dessert, and popsicles. Avoid any liquids that contain  red or purple dye.  On the day of the procedure - do not eat or drink anything during the 2 hours before the procedure, or within the time period that your health care provider recommends.  Bowel prep If you were prescribed an oral bowel prep to clean out your colon:  Take it as told by your health care provider. Starting the day before your procedure, you will need to drink a large amount of medicated liquid. The liquid will cause you to have multiple loose stools until your stool is almost clear or light green.  If your skin or anus gets irritated from diarrhea, you may use these to relieve the irritation: ? Medicated wipes, such as adult wet wipes with aloe and vitamin E. ? A skin soothing-product like petroleum jelly.  If you vomit while drinking the bowel prep, take a break for up to 60 minutes and then begin the bowel prep again. If vomiting continues and you cannot take the bowel prep without vomiting, call your health care provider.  General instructions  Ask your health care provider about changing or stopping your regular medicines. This is especially important if you are taking diabetes medicines or blood thinners.  Plan to have someone take you home from the hospital or clinic. What happens during the procedure?  An IV tube may be inserted into one of your veins.  You will be given medicine to help you relax (sedative).  To reduce your risk of infection: ? Your health care team will wash or sanitize their hands. ? Your anal area will be washed with soap.  You will be asked to lie on your side with your knees bent.  Your health care provider will lubricate a long, thin, flexible tube. The tube will have a camera and a light on the end.  The tube will be inserted into your anus.  The tube will be gently eased through your rectum and colon.  Air will be delivered into your colon to keep it open. You may feel some pressure or cramping.  The camera will be used to take  images during the procedure.  A small tissue sample may be removed from your body to be examined under a microscope (biopsy). If any potential problems are found, the tissue will be sent to a lab for testing.  If small polyps are found, your health care provider may remove them and have them checked for cancer cells.  The tube that was inserted into your anus will be slowly removed. The procedure may vary among health care providers and hospitals. What happens after the procedure?  Your blood pressure, heart rate, breathing rate, and blood oxygen level will be monitored until the medicines you were given have worn off.  Do not drive for 24 hours after the exam.  You may have a small amount of blood in your stool.  You may pass gas and have mild abdominal cramping or bloating due to the air that was used to inflate your colon during the exam.  It is up to you to  get the results of your procedure. Ask your health care provider, or the department performing the procedure, when your results will be ready. This information is not intended to replace advice given to you by your health care provider. Make sure you discuss any questions you have with your health care provider. Document Released: 12/09/2000 Document Revised: 10/12/2016 Document Reviewed: 02/23/2016 Elsevier Interactive Patient Education  2018 [employer].

## 2017-08-01 ENCOUNTER — Other Ambulatory Visit: Payer: Self-pay | Admitting: Gastroenterology

## 2017-08-01 ENCOUNTER — Other Ambulatory Visit: Payer: Self-pay

## 2017-08-01 ENCOUNTER — Encounter (HOSPITAL_COMMUNITY)
Admission: RE | Admit: 2017-08-01 | Discharge: 2017-08-01 | Disposition: A | Discharge status: Home / Self Care | Payer: 59 | Source: Ambulatory Visit | Attending: Internal Medicine | Admitting: Internal Medicine

## 2017-08-01 ENCOUNTER — Encounter (HOSPITAL_COMMUNITY): Payer: Self-pay

## 2017-08-01 DIAGNOSIS — Z01818 Encounter for other preprocedural examination: Secondary | ICD-10-CM | POA: Diagnosis not present

## 2017-08-01 LAB — BASIC METABOLIC PANEL
ANION GAP: 7 (ref 5–15)
BUN: 17 mg/dL (ref 6–20)
CALCIUM: 9 mg/dL (ref 8.9–10.3)
CO2: 27 mmol/L (ref 22–32)
Chloride: 102 mmol/L (ref 101–111)
Creatinine, Ser: 1.36 mg/dL — ABNORMAL HIGH (ref 0.61–1.24)
GFR, EST NON AFRICAN AMERICAN: 56 mL/min — AB (ref >60)
Glucose, Bld: 120 mg/dL — ABNORMAL HIGH (ref 65–99)
POTASSIUM: 3.1 mmol/L — AB (ref 3.5–5.1)
Sodium: 136 mmol/L (ref 135–145)

## 2017-08-01 LAB — CBC WITH DIFFERENTIAL/PLATELET
BASOS ABS: 0.1 10*3/uL (ref 0.0–0.1)
BASOS PCT: 1 %
Eosinophils Absolute: 0.2 10*3/uL (ref 0.0–0.7)
Eosinophils Relative: 3 %
HEMATOCRIT: 53 % — AB (ref 39.0–52.0)
Hemoglobin: 15.1 g/dL (ref 13.0–17.0)
LYMPHS PCT: 9 %
Lymphs Abs: 0.6 10*3/uL — ABNORMAL LOW (ref 0.7–4.0)
MCH: 29.7 pg (ref 26.0–34.0)
MCHC: 28.5 g/dL — ABNORMAL LOW (ref 30.0–36.0)
MCV: 104.3 fL — AB (ref 78.0–100.0)
MONO ABS: 0.6 10*3/uL (ref 0.1–1.0)
Monocytes Relative: 9 %
NEUTROS ABS: 4.9 10*3/uL (ref 1.7–7.7)
Neutrophils Relative %: 78 %
PLATELETS: 165 10*3/uL (ref 150–400)
RBC: 5.08 MIL/uL (ref 4.22–5.81)
RDW: 16.3 % — AB (ref 11.5–15.5)
WBC: 6.3 10*3/uL (ref 4.0–10.5)

## 2017-08-01 MED ORDER — POTASSIUM CHLORIDE 20 MEQ PO PACK: 20.0000 meq | PACK | Freq: Two times a day (BID) | ORAL | 0 refills | Status: DC

## 2017-08-01 NOTE — Progress Notes (Signed)
Low potassium. I am sending in supplementation for 3 days to start now. New onset macrocytosis. Unclear etiology. Please send to PCP.

## 2017-08-01 NOTE — Progress Notes (Signed)
Let's repeat CBC in 4 weeks

## 2017-08-02 ENCOUNTER — Other Ambulatory Visit: Payer: Self-pay | Admitting: Gastroenterology

## 2017-08-02 DIAGNOSIS — R195 Other fecal abnormalities: Secondary | ICD-10-CM

## 2017-08-02 NOTE — Pre-Procedure Instructions (Signed)
Potassium of 3.1 shown to Dr Patsey Berthold. Patient called per his order and instructed to increase his potassium from 20 mEq BID to 40 mEq BID until after his procedure. Will do I-Stat on arrival.

## 2017-08-07 ENCOUNTER — Ambulatory Visit (HOSPITAL_COMMUNITY)
Admission: RE | Admit: 2017-08-07 | Discharge: 2017-08-07 | Disposition: A | Discharge status: Home / Self Care | Payer: 59 | Source: Ambulatory Visit | Attending: Internal Medicine | Admitting: Internal Medicine

## 2017-08-07 ENCOUNTER — Encounter (HOSPITAL_COMMUNITY): Payer: Self-pay

## 2017-08-07 ENCOUNTER — Ambulatory Visit (HOSPITAL_COMMUNITY): Payer: 59 | Admitting: Anesthesiology

## 2017-08-07 ENCOUNTER — Encounter (HOSPITAL_COMMUNITY)
Admission: RE | Disposition: A | Discharge status: Home / Self Care | Payer: Self-pay | Source: Ambulatory Visit | Attending: Internal Medicine

## 2017-08-07 DIAGNOSIS — E785 Hyperlipidemia, unspecified: Secondary | ICD-10-CM | POA: Diagnosis not present

## 2017-08-07 DIAGNOSIS — Z923 Personal history of irradiation: Secondary | ICD-10-CM | POA: Insufficient documentation

## 2017-08-07 DIAGNOSIS — D120 Benign neoplasm of cecum: Secondary | ICD-10-CM | POA: Diagnosis not present

## 2017-08-07 DIAGNOSIS — Z86010 Personal history of colonic polyps: Secondary | ICD-10-CM | POA: Insufficient documentation

## 2017-08-07 DIAGNOSIS — K219 Gastro-esophageal reflux disease without esophagitis: Secondary | ICD-10-CM | POA: Insufficient documentation

## 2017-08-07 DIAGNOSIS — D125 Benign neoplasm of sigmoid colon: Secondary | ICD-10-CM

## 2017-08-07 DIAGNOSIS — G8929 Other chronic pain: Secondary | ICD-10-CM | POA: Insufficient documentation

## 2017-08-07 DIAGNOSIS — R195 Other fecal abnormalities: Secondary | ICD-10-CM

## 2017-08-07 DIAGNOSIS — D124 Benign neoplasm of descending colon: Secondary | ICD-10-CM | POA: Insufficient documentation

## 2017-08-07 DIAGNOSIS — Z9221 Personal history of antineoplastic chemotherapy: Secondary | ICD-10-CM | POA: Diagnosis not present

## 2017-08-07 DIAGNOSIS — F419 Anxiety disorder, unspecified: Secondary | ICD-10-CM | POA: Insufficient documentation

## 2017-08-07 DIAGNOSIS — D123 Benign neoplasm of transverse colon: Secondary | ICD-10-CM

## 2017-08-07 DIAGNOSIS — M549 Dorsalgia, unspecified: Secondary | ICD-10-CM | POA: Insufficient documentation

## 2017-08-07 DIAGNOSIS — K621 Rectal polyp: Secondary | ICD-10-CM | POA: Diagnosis not present

## 2017-08-07 DIAGNOSIS — K5730 Diverticulosis of large intestine without perforation or abscess without bleeding: Secondary | ICD-10-CM | POA: Diagnosis not present

## 2017-08-07 HISTORY — PX: POLYPECTOMY: SHX5525

## 2017-08-07 HISTORY — PX: COLONOSCOPY WITH PROPOFOL: SHX5780

## 2017-08-07 LAB — POCT I-STAT 4, (NA,K, GLUC, HGB,HCT)
Glucose, Bld: 127 mg/dL — ABNORMAL HIGH (ref 65–99)
HCT: 55 % — ABNORMAL HIGH (ref 39.0–52.0)
Hemoglobin: 18.7 g/dL — ABNORMAL HIGH (ref 13.0–17.0)
POTASSIUM: 3.7 mmol/L (ref 3.5–5.1)
Sodium: 145 mmol/L (ref 135–145)

## 2017-08-07 SURGERY — COLONOSCOPY WITH PROPOFOL
Anesthesia: Monitor Anesthesia Care

## 2017-08-07 MED ORDER — MIDAZOLAM HCL 2 MG/2ML IJ SOLN
1.0000 mg | INTRAMUSCULAR | Status: AC
Start: 2017-08-07 — End: 2017-08-07
  Administered 2017-08-07: 2 mg via INTRAVENOUS

## 2017-08-07 MED ORDER — PROPOFOL 10 MG/ML IV BOLUS
INTRAVENOUS | Status: AC
  Filled 2017-08-07: qty 20

## 2017-08-07 MED ORDER — FENTANYL CITRATE (PF) 100 MCG/2ML IJ SOLN: INTRAMUSCULAR | Status: DC | PRN

## 2017-08-07 MED ORDER — LACTATED RINGERS IV SOLN
INTRAVENOUS | Status: DC
  Administered 2017-08-07: 11:00:00 via INTRAVENOUS

## 2017-08-07 MED ORDER — CHLORHEXIDINE GLUCONATE CLOTH 2 % EX PADS: 6.0000 | MEDICATED_PAD | Freq: Once | CUTANEOUS | Status: DC

## 2017-08-07 MED ORDER — PROPOFOL 500 MG/50ML IV EMUL
INTRAVENOUS | Status: DC | PRN
Start: 2017-08-07 — End: 2017-08-07
  Administered 2017-08-07: 150 ug/kg/min via INTRAVENOUS

## 2017-08-07 MED ORDER — FENTANYL CITRATE (PF) 100 MCG/2ML IJ SOLN
25.0000 ug | Freq: Once | INTRAMUSCULAR | Status: AC
  Administered 2017-08-07: 25 ug via INTRAVENOUS

## 2017-08-07 MED ORDER — SODIUM CHLORIDE 0.9 % IR SOLN
Status: DC | PRN
  Administered 2017-08-07: 1 mL

## 2017-08-07 MED ORDER — MIDAZOLAM HCL 2 MG/2ML IJ SOLN
INTRAMUSCULAR | Status: AC
  Filled 2017-08-07: qty 2

## 2017-08-07 MED ORDER — ONDANSETRON HCL 4 MG/2ML IJ SOLN
4.0000 mg | Freq: Once | INTRAMUSCULAR | Status: AC
  Administered 2017-08-07: 4 mg via INTRAVENOUS

## 2017-08-07 MED ORDER — FENTANYL CITRATE (PF) 100 MCG/2ML IJ SOLN
INTRAMUSCULAR | Status: AC
  Filled 2017-08-07: qty 2

## 2017-08-07 MED ORDER — ONDANSETRON HCL 4 MG/2ML IJ SOLN
INTRAMUSCULAR | Status: AC
  Filled 2017-08-07: qty 2

## 2017-08-07 NOTE — Anesthesia Preprocedure Evaluation (Signed)
Anesthesia Evaluation  Patient identified by MRN, date of birth, ID band Patient awake    Reviewed: Allergy & Precautions, NPO status , Patient's Chart, lab work & pertinent test results  History of Anesthesia Complications (+) PONV and history of anesthetic complications  Airway Mallampati: I  TM Distance: >3 FB Neck ROM: Full   Comment: Hx oral Cancer with radiation treatment. Dental  (+) Teeth Intact   Pulmonary    breath sounds clear to auscultation       Cardiovascular negative cardio ROS   Rhythm:Regular Rate:Normal     Neuro/Psych Anxiety    GI/Hepatic GERD  ,  Endo/Other    Renal/GU Renal disease     Musculoskeletal   Abdominal   Peds  Hematology   Anesthesia Other Findings   Reproductive/Obstetrics                             Anesthesia Physical Anesthesia Plan  ASA: III  Anesthesia Plan: MAC   Post-op Pain Management:    Induction: Intravenous  PONV Risk Score and Plan:   Airway Management Planned: Simple Face Mask  Additional Equipment:   Intra-op Plan:   Post-operative Plan:   Informed Consent: I have reviewed the patients History and Physical, chart, labs and discussed the procedure including the risks, benefits and alternatives for the proposed anesthesia with the patient or authorized representative who has indicated his/her understanding and acceptance.     Plan Discussed with:   Anesthesia Plan Comments:         Anesthesia Quick Evaluation

## 2017-08-07 NOTE — Discharge Instructions (Signed)
°Colonoscopy °Discharge Instructions ° °Read the instructions outlined below and refer to this sheet in the next few weeks. These discharge instructions provide you with general information on caring for yourself after you leave the hospital. Your doctor may also give you specific instructions. While your treatment has been planned according to the most current medical practices available, unavoidable complications occasionally occur. If you have any problems or questions after discharge, call Dr. Rourk at [phone]. °ACTIVITY °· You may resume your regular activity, but move at a slower pace for the next 24 hours.  °· Take frequent rest periods for the next 24 hours.  °· Walking will help get rid of the air and reduce the bloated feeling in your belly (abdomen).  °· No driving for 24 hours (because of the medicine (anesthesia) used during the test).   °· Do not sign any important legal documents or operate any machinery for 24 hours (because of the anesthesia used during the test).  °NUTRITION °· Drink plenty of fluids.  °· You may resume your normal diet as instructed by your doctor.  °· Begin with a light meal and progress to your normal diet. Heavy or fried foods are harder to digest and may make you feel sick to your stomach (nauseated).  °· Avoid alcoholic beverages for 24 hours or as instructed.  °MEDICATIONS °· You may resume your normal medications unless your doctor tells you otherwise.  °WHAT YOU CAN EXPECT TODAY °· Some feelings of bloating in the abdomen.  °· Passage of more gas than usual.  °· Spotting of blood in your stool or on the toilet paper.  °IF YOU HAD POLYPS REMOVED DURING THE COLONOSCOPY: °· No aspirin products for 7 days or as instructed.  °· No alcohol for 7 days or as instructed.  °· Eat a soft diet for the next 24 hours.  °FINDING OUT THE RESULTS OF YOUR TEST °Not all test results are available during your visit. If your test results are not back during the visit, make an appointment  with your caregiver to find out the results. Do not assume everything is normal if you have not heard from your caregiver or the medical facility. It is important for you to follow up on all of your test results.  °SEEK IMMEDIATE MEDICAL ATTENTION IF: °· You have more than a spotting of blood in your stool.  °· Your belly is swollen (abdominal distention).  °· You are nauseated or vomiting.  °· You have a temperature over 101.  °· You have abdominal pain or discomfort that is severe or gets worse throughout the day.  ° ° °Colon polyp and diverticulosis information provided ° °Further recommendations to follow pending review of pathology report ° ° °Colon Polyps °Polyps are tissue growths inside the body. Polyps can grow in many places, including the large intestine (colon). A polyp may be a round bump or a mushroom-shaped growth. You could have one polyp or several. °Most colon polyps are noncancerous (benign). However, some colon polyps can become cancerous over time. °What are the causes? °The exact cause of colon polyps is not known. °What increases the risk? °This condition is more likely to develop in people who: °· Have a family history of colon cancer or colon polyps. °· Are older than 50 or older than 45 if they are African American. °· Have inflammatory bowel disease, such as ulcerative colitis or Crohn disease. °· Are overweight. °· Smoke cigarettes. °· Do not get enough exercise. °· Drink too   much alcohol. °· Eat a diet that is: °? High in fat and red meat. °? Low in fiber. °· Had childhood cancer that was treated with abdominal radiation. ° °What are the signs or symptoms? °Most polyps do not cause symptoms. If you have symptoms, they may include: °· Blood coming from your rectum when having a bowel movement. °· Blood in your stool. The stool may look dark red or black. °· A change in bowel habits, such as constipation or diarrhea. ° °How is this diagnosed? °This condition is diagnosed with a  colonoscopy. This is a procedure that uses a lighted, flexible scope to look at the inside of your colon. °How is this treated? °Treatment for this condition involves removing any polyps that are found. Those polyps will then be tested for cancer. If cancer is found, your health care provider will talk to you about options for colon cancer treatment. °Follow these instructions at home: °Diet °· Eat plenty of fiber, such as fruits, vegetables, and whole grains. °· Eat foods that are high in calcium and vitamin D, such as milk, cheese, yogurt, eggs, liver, fish, and broccoli. °· Limit foods high in fat, red meats, and processed meats, such as hot dogs, sausage, bacon, and lunch meats. °· Maintain a healthy weight, or lose weight if recommended by your health care provider. °General instructions °· Do not smoke cigarettes. °· Do not drink alcohol excessively. °· Keep all follow-up visits as told by your health care provider. This is important. This includes keeping regularly scheduled colonoscopies. Talk to your health care provider about when you need a colonoscopy. °· Exercise every day or as told by your health care provider. °Contact a health care provider if: °· You have new or worsening bleeding during a bowel movement. °· You have new or increased blood in your stool. °· You have a change in bowel habits. °· You unexpectedly lose weight. °This information is not intended to replace advice given to you by your health care provider. Make sure you discuss any questions you have with your health care provider. °Document Released: 09/07/2004 Document Revised: 05/19/2016 Document Reviewed: 11/02/2015 °Elsevier Interactive Patient Education © 2018 [employer] ° ° °Diverticulosis °Diverticulosis is a condition that develops when small pouches (diverticula) form in the wall of the large intestine (colon). The colon is where water is absorbed and stool is formed. The pouches form when the inside layer of the colon  pushes through weak spots in the outer layers of the colon. You may have a few pouches or many of them. °What are the causes? °The cause of this condition is not known. °What increases the risk? °The following factors may make you more likely to develop this condition: °· Being older than age 60. Your risk for this condition increases with age. Diverticulosis is rare among people younger than age 30. By age 80, many people have it. °· Eating a low-fiber diet. °· Having frequent constipation. °· Being overweight. °· Not getting enough exercise. °· Smoking. °· Taking over-the-counter pain medicines, like aspirin and ibuprofen. °· Having a family history of diverticulosis. ° °What are the signs or symptoms? °In most people, there are no symptoms of this condition. If you do have symptoms, they may include: °· Bloating. °· Cramps in the abdomen. °· Constipation or diarrhea. °· Pain in the lower left side of the abdomen. ° °How is this diagnosed? °This condition is most often diagnosed during an exam for other colon problems. Because diverticulosis usually has no   symptoms, it often cannot be diagnosed independently. This condition may be diagnosed by: °· Using a flexible scope to examine the colon (colonoscopy). °· Taking an X-ray of the colon after dye has been put into the colon (barium enema). °· Doing a CT scan. ° °How is this treated? °You may not need treatment for this condition if you have never developed an infection related to diverticulosis. If you have had an infection before, treatment may include: °· Eating a high-fiber diet. This may include eating more fruits, vegetables, and grains. °· Taking a fiber supplement. °· Taking a live bacteria supplement (probiotic). °· Taking medicine to relax your colon. °· Taking antibiotic medicines. ° °Follow these instructions at home: °· Drink 6-8 glasses of water or more each day to prevent constipation. °· Try not to strain when you have a bowel movement. °· If you  have had an infection before: °? Eat more fiber as directed by your health care provider or your diet and nutrition specialist (dietitian). °? Take a fiber supplement or probiotic, if your health care provider approves. °· Take over-the-counter and prescription medicines only as told by your health care provider. °· If you were prescribed an antibiotic, take it as told by your health care provider. Do not stop taking the antibiotic even if you start to feel better. °· Keep all follow-up visits as told by your health care provider. This is important. °Contact a health care provider if: °· You have pain in your abdomen. °· You have bloating. °· You have cramps. °· You have not had a bowel movement in 3 days. °Get help right away if: °· Your pain gets worse. °· Your bloating becomes very bad. °· You have a fever or chills, and your symptoms suddenly get worse. °· You vomit. °· You have bowel movements that are bloody or black. °· You have bleeding from your rectum. °Summary °· Diverticulosis is a condition that develops when small pouches (diverticula) form in the wall of the large intestine (colon). °· You may have a few pouches or many of them. °· This condition is most often diagnosed during an exam for other colon problems. °· If you have had an infection related to diverticulosis, treatment may include increasing the fiber in your diet, taking supplements, or taking medicines. °This information is not intended to replace advice given to you by your health care provider. Make sure you discuss any questions you have with your health care provider. °Document Released: 09/08/2004 Document Revised: 10/31/2016 Document Reviewed: 10/31/2016 °Elsevier Interactive Patient Education © 2017 Elsevier Inc. ° °

## 2017-08-07 NOTE — Transfer of Care (Signed)
Immediate Anesthesia Transfer of Care Note  Patient: [name] Best  Procedure(s) Performed: Procedure(s) with comments: COLONOSCOPY WITH PROPOFOL (N/A) - 12:30pm POLYPECTOMY - colon  Patient Location: PACU  Anesthesia Type:MAC  Level of Consciousness: awake, alert  and oriented  Airway & Oxygen Therapy: Patient Spontanous Breathing and Patient connected to nasal cannula oxygen  Post-op Assessment: Report given to RN and Post -op Vital signs reviewed and stable  Post vital signs: Reviewed and stable  Last Vitals:  Vitals:   08/07/17 1045 08/07/17 1050  BP: (!) 149/96 (!) 129/95  Pulse:    Resp: 15 13  Temp:    SpO2: 98% 99%    Last Pain:  Vitals:   08/07/17 1100  TempSrc:   PainSc: 1          Complications: No apparent anesthesia complications

## 2017-08-07 NOTE — H&P (Signed)
@LOGO @   Primary Care Physician:  Sharilyn Sites, MD Primary Gastroenterologist:  Dr. Gala Romney  Pre-Procedure History & Physical: HPI:  [name] Best is a 56 y.o. male here for here for colonoscopy. History of multiple adenomas removed in 2014. Colonoscopy now being planned as a surveillance maneuver.  Past Medical History:  Diagnosis Date  . Anxiety   . C. difficile diarrhea 2014   treated with Flagyl  . Cancer ([place])   . Chronic back pain   . Diverticulitis    hospitalized in 2003  . Hx of radiation therapy 01/25/16- 03/15/16   Base of Tongue, bilateral neck  . Hyperlipidemia   . PONV (postoperative nausea and vomiting)     Past Surgical History:  Procedure Laterality Date  . CHOLECYSTECTOMY    . COLONOSCOPY N/A 07/02/2013   Jenkins:Sessile polyp (tubular adenoma) ranging between 3-43m in size in the proximal sigmoid colon/mild diverticulosis   . COLONOSCOPY     Dr. RLaural Golden prior to 2003 per patient  . DIRECT LARYNGOSCOPY N/A 12/10/2015   Procedure: DIRECT LARYNGOSCOPY;  Surgeon: SLeta Baptist MD;  [facility]  Service: ENT;  Laterality: N/A;  . ESOPHAGOGASTRODUODENOSCOPY N/A 07/02/2013   Jenkins:4 cm hiatal hernia/esophagus appeared normal/chronic gastritis. Clotest negative.  .Marland KitchenGASTROSTOMY TUBE CHANGE     placement and removal during chemo/radiation. no longer present  . KNEE ARTHROSCOPY WITH MEDIAL MENISECTOMY Left 04/14/2015   Procedure: KNEE ARTHROSCOPY WITH MEDIAL MENISECTOMY;  Surgeon: WSanjuana Kava MD;  Location: [facility];  Service: Orthopedics;  Laterality: Left;  . left elbow    . MASS BIOPSY Right 12/10/2015   Procedure: RIGHT EXCISIONAL NECK MASS BIOPSY;  Surgeon: SLeta Baptist MD;  [facility]  Service: ENT;  Laterality: Right;  . TONGUE BIOPSY Right 12/10/2015   Procedure: TONGUE BIOPSY;  Surgeon: SLeta Baptist MD;  [facility]  Service: ENT;  Laterality: Right;    Prior to Admission medications    Medication Sig Start Date End Date Taking? Authorizing Provider  diazepam (VALIUM) 10 MG tablet Take 1 tablet (10 mg total) by mouth at bedtime as needed for anxiety or sleep. Reported on 02/16/2016 04/18/16  Yes DHolley Bouche NP  HYDROcodone-acetaminophen (NORCO/VICODIN) 5-325 MG tablet Take 1 tablet by mouth every 8 (eight) hours as needed for moderate pain.   Yes [provider]  hydrocortisone (ANUSOL-HC) 2.5 % rectal cream Place 1 application rectally 2 (two) times daily. Patient taking differently: Place 1 application rectally as needed.  06/30/17  Yes BAnnitta Needs NP  Na Sulfate-K Sulfate-Mg Sulf (SUPREP BOWEL PREP KIT) 17.5-3.13-1.6 GM/180ML SOLN Take 1 kit by mouth as directed. 06/30/17  Yes Broady Lafoy, RCristopher Estimable MD  pravastatin (PRAVACHOL) 20 MG tablet Take 20 mg by mouth daily. Reported on 03/14/2016   Yes [provider]  ranitidine (ZANTAC) 150 MG tablet Take 150 mg by mouth daily as needed for heartburn.   Yes [provider]  sodium fluoride (FLUORISHIELD) 1.1 % GEL dental gel Instill one drop of gel per tooth space of tray. Place over teeth for 5 minutes. Remove. Spit out excess. Repeat nightly. Patient taking differently: Place 1 application onto teeth at bedtime. Instill one drop of gel per tooth space of tray. Place over teeth for 5 minutes. Remove. Spit out excess. Repeat nightly. 02/08/16  Yes SEppie Gibson MD  HYDROcodone-acetaminophen (NORCO) 7.5-325 MG tablet Take 1 tab up to QID as needed for pain. Patient not taking: Reported on 07/27/2017 05/05/16  Holley Bouche, NP  potassium chloride (KLOR-CON) 20 MEQ packet Take 20 mEq by mouth 2 (two) times daily. 08/01/17 08/04/17  Annitta Needs, NP    Allergies as of 06/30/2017 - Review Complete 06/30/2017  Allergen Reaction Noted  . Tetracyclines & related Hives 08/16/2011    Family History  Problem Relation Age of Onset  . Heart failure Mother 45  . Heart attack Father 5  . Heart disease Unknown    . Cancer Paternal Uncle        died of cancer  . Colon cancer Neg Hx   . Colon polyps Neg Hx     Social History   Social History  . Marital status: Married    Spouse name: N/A  . Number of children: 2  . Years of education: 12th grade   Occupational History  . retired    Social History Main Topics  . Smoking status: Never Smoker  . Smokeless tobacco: Never Used  . Alcohol use No  . Drug use: Yes    Types: Marijuana     Comment: "sometimes for appetite"  . Sexual activity: Not on file     Comment: married, retired, 1 son & 1 daughter   Other Topics Concern  . Not on file   Social History Narrative   Lives with mother.      Review of Systems: See HPI, otherwise negative ROS  Physical Exam: BP (!) 143/107   Pulse 88   Temp (!) 97.5 F (36.4 C) (Oral)   Resp 14   Ht 6' 2.5" (1.892 m)   Wt 218 lb (98.9 kg)   SpO2 98%   BMI 27.62 kg/m  General:   Alert,  Well-developed, well-nourished, pleasant and cooperative in NAD Neck:  Supple; no masses or thyromegaly. No significant cervical adenopathy. Lungs:  Clear throughout to auscultation.   No wheezes, crackles, or rhonchi. No acute distress. Heart:  Regular rate and rhythm; no murmurs, clicks, rubs,  or gallops. Abdomen: Non-distended, normal bowel sounds.  Soft and nontender without appreciable mass or hepatosplenomegaly.  Pulses:  Normal pulses noted. Extremities:  Without clubbing or edema.  Impression:  Pleasant 56 year old gentleman with history of multiple colonic adenomas removed 4 years ago. Hemoccult positive this time.  Recommendations:  I have offered the patient a diagnostic colonoscopy today.  The risks, benefits, limitations, alternatives and imponderables have been reviewed with the patient. Questions have been answered. All parties are agreeable.    Notice: This dictation was prepared with Dragon dictation along with smaller phrase technology. Any transcriptional errors that result from this  process are unintentional and may not be corrected upon review.

## 2017-08-07 NOTE — Op Note (Signed)
[facility] Patient Name: [name] Best Procedure Date: 08/07/2017 9:56 AM MRN: [id number] Date of Birth: [birth year] Attending MD: Norvel Richards , MD CSN: [id number] Age: 56 Admit Type: Outpatient Procedure:                Colonoscopy Indications:              Heme positive stool Providers:                Norvel Richards, MD Referring MD:              Medicines:                Propofol per Anesthesia Complications:            No immediate complications. Estimated Blood Loss:     Estimated blood loss was minimal. Procedure:                Pre-Anesthesia Assessment:                           - Prior to the procedure, a History and Physical                            was performed, and patient medications and                            allergies were reviewed. The patient's tolerance of                            previous anesthesia was also reviewed. The risks                            and benefits of the procedure and the sedation                            options and risks were discussed with the patient.                            All questions were answered, and informed consent                            was obtained. Prior Anticoagulants: The patient has                            taken no previous anticoagulant or antiplatelet                            agents. ASA Grade Assessment: III - A patient with                            severe systemic disease. After reviewing the risks                            and benefits, the patient was deemed in  satisfactory condition to undergo the procedure.                           After obtaining informed consent, the colonoscope                            was passed under direct vision. Throughout the                            procedure, the patient's blood pressure, pulse, and                            oxygen saturations were monitored continuously. The                            EC-[id number]  ([id number]) scope was introduced through                            the and advanced to the the cecum, identified by                            appendiceal orifice and ileocecal valve. The                            colonoscopy was performed without difficulty. The                            patient tolerated the procedure well. The quality                            of the bowel preparation was adequate. The entire                            colon was well visualized. The ileocecal valve,                            appendiceal orifice, and rectum were photographed. Scope In: 11:09:32 AM Scope Out: 11:58:07 AM Scope Withdrawal Time: 0 hours 36 minutes 22 seconds  Total Procedure Duration: 0 hours 48 minutes 35 seconds  Findings:      The perianal and digital rectal examinations were normal.      Scattered small and large-mouthed diverticula were found in the sigmoid       colon and descending colon.      Multiple semi-pedunculated polyps were found in the rectum, sigmoid       colon, descending colon, transverse colon and cecum. The polyps were 4       to 13 mm in size. These polyps were removed with a cold or hot snare.       (1) sessile polyp in the cecum was lifted from the colon wall with 1.5       mL of normal saline. This was done nicely. It was completely removed       with piecemeal hot snare. A 1.2 cm polyp in the descending segment was       removed with hot snare cautery. All the  other polyps were smaller in       dimensions. Some were cold snared. Resection and retrieval were       complete. Estimated blood loss was minimal. Impression:               - Diverticulosis in the sigmoid colon and in the                            descending colon.                           - Multiple 4 to 13 mm polyps in the rectum, in the                            sigmoid colon, in the descending colon, in the                            transverse colon and in the cecum, removed.                             Resected and retrieved. Moderate Sedation:      Moderate (conscious) sedation was personally administered by an       anesthesia professional. The following parameters were monitored: oxygen       saturation, heart rate, blood pressure, respiratory rate, EKG, adequacy       of pulmonary ventilation, and response to care. Total physician       intraservice time was 59 minutes. Recommendation:           - Patient has a contact number available for                            emergencies. The signs and symptoms of potential                            delayed complications were discussed with the                            patient. Return to normal activities tomorrow.                            Written discharge instructions were provided to the                            patient.                           - Resume previous diet.                           - Continue present medications.                           - Repeat colonoscopy date to be determined after                            pending pathology results are reviewed  for                            surveillance based on pathology results.                           - Return to GI clinic (date not yet determined). Procedure Code(s):        --- Professional ---                           [phone], Colonoscopy, flexible; with removal of                            tumor(s), polyp(s), or other lesion(s) by snare                            technique Diagnosis Code(s):        --- Professional ---                           K62.1, Rectal polyp                           D12.5, Benign neoplasm of sigmoid colon                           D12.4, Benign neoplasm of descending colon                           D12.3, Benign neoplasm of transverse colon (hepatic                            flexure or splenic flexure)                           D12.0, Benign neoplasm of cecum                           R19.5, Other fecal abnormalities                            K57.30, Diverticulosis of large intestine without                            perforation or abscess without bleeding CPT copyright 2016 American Medical [employer]. All rights reserved. The codes documented in this report are preliminary and upon coder review may  be revised to meet current compliance requirements. Cristopher Estimable. Ellington Cornia, MD Norvel Richards, MD 08/07/2017 12:14:11 PM This report has been signed electronically. Number of Addenda: 0

## 2017-08-08 NOTE — Anesthesia Postprocedure Evaluation (Signed)
Anesthesia Post Note  Patient: ORONDE HALLENBECK  Procedure(s) Performed: Procedure(s) (LRB): COLONOSCOPY WITH PROPOFOL (N/A) POLYPECTOMY  Patient location during evaluation: PACU Anesthesia Type: MAC Level of consciousness: awake and alert Pain management: satisfactory to patient Vital Signs Assessment: post-procedure vital signs reviewed and stable Respiratory status: spontaneous breathing Cardiovascular status: stable Postop Assessment: no signs of nausea or vomiting Anesthetic complications: no Comments: Late entry T. Tayson Schnelle CRNA 08/08/2017 0712     Last Vitals:  Vitals:   08/07/17 1215 08/07/17 1241  BP: (!) 138/96 126/90  Pulse: 86 68  Resp: (!) 26 18  Temp:  36.7 C  SpO2: 97% 94%    Last Pain:  Vitals:   08/07/17 1241  TempSrc: Oral  PainSc: 0-No pain                 Zakkary Thibault

## 2017-08-09 ENCOUNTER — Encounter: Payer: Self-pay | Admitting: Internal Medicine

## 2017-08-10 ENCOUNTER — Encounter (HOSPITAL_COMMUNITY): Payer: Self-pay | Admitting: Internal Medicine

## 2017-08-10 ENCOUNTER — Other Ambulatory Visit: Payer: Self-pay

## 2017-08-10 DIAGNOSIS — R195 Other fecal abnormalities: Secondary | ICD-10-CM

## 2017-09-01 LAB — CBC WITH DIFFERENTIAL/PLATELET
BASOS ABS: 0.1 10*3/uL (ref 0.0–0.2)
Basos: 1 %
EOS (ABSOLUTE): 0.2 10*3/uL (ref 0.0–0.4)
Eos: 4 %
Hematocrit: 44.8 % (ref 37.5–51.0)
Hemoglobin: 14.8 g/dL (ref 13.0–17.7)
IMMATURE GRANULOCYTES: 1 %
Immature Grans (Abs): 0 10*3/uL (ref 0.0–0.1)
LYMPHS: 9 %
Lymphocytes Absolute: 0.5 10*3/uL — ABNORMAL LOW (ref 0.7–3.1)
MCH: 28.8 pg (ref 26.6–33.0)
MCHC: 33 g/dL (ref 31.5–35.7)
MCV: 87 fL (ref 79–97)
MONOS ABS: 0.6 10*3/uL (ref 0.1–0.9)
Monocytes: 10 %
NEUTROS PCT: 75 %
Neutrophils Absolute: 4.5 10*3/uL (ref 1.4–7.0)
PLATELETS: 197 10*3/uL (ref 150–379)
RBC: 5.14 x10E6/uL (ref 4.14–5.80)
RDW: 14.3 % (ref 12.3–15.4)
WBC: 5.9 10*3/uL (ref 3.4–10.8)

## 2017-09-04 NOTE — Progress Notes (Signed)
Macrocytosis has resolved. CBC now near normal.

## 2017-11-23 ENCOUNTER — Ambulatory Visit (INDEPENDENT_AMBULATORY_CARE_PROVIDER_SITE_OTHER): Payer: 59 | Admitting: Otolaryngology

## 2017-11-23 DIAGNOSIS — Z85819 Personal history of malignant neoplasm of unspecified site of lip, oral cavity, and pharynx: Secondary | ICD-10-CM | POA: Diagnosis not present

## 2017-12-15 ENCOUNTER — Other Ambulatory Visit: Payer: Self-pay | Admitting: Gastroenterology

## 2017-12-15 ENCOUNTER — Other Ambulatory Visit: Payer: Self-pay | Admitting: Hematology and Oncology

## 2017-12-15 DIAGNOSIS — C01 Malignant neoplasm of base of tongue: Secondary | ICD-10-CM

## 2018-01-02 ENCOUNTER — Encounter: Payer: Self-pay | Admitting: Internal Medicine

## 2018-03-06 DIAGNOSIS — E663 Overweight: Secondary | ICD-10-CM | POA: Diagnosis not present

## 2018-03-06 DIAGNOSIS — Z6829 Body mass index (BMI) 29.0-29.9, adult: Secondary | ICD-10-CM | POA: Diagnosis not present

## 2018-03-06 DIAGNOSIS — G894 Chronic pain syndrome: Secondary | ICD-10-CM | POA: Diagnosis not present

## 2018-03-06 DIAGNOSIS — Z1389 Encounter for screening for other disorder: Secondary | ICD-10-CM | POA: Diagnosis not present

## 2018-03-15 ENCOUNTER — Encounter: Payer: Self-pay | Admitting: Hematology and Oncology

## 2018-03-15 ENCOUNTER — Other Ambulatory Visit: Payer: Self-pay

## 2018-03-15 ENCOUNTER — Other Ambulatory Visit: Payer: Self-pay | Admitting: Hematology and Oncology

## 2018-03-15 ENCOUNTER — Ambulatory Visit: Payer: Self-pay | Admitting: Hematology and Oncology

## 2018-03-15 DIAGNOSIS — C01 Malignant neoplasm of base of tongue: Secondary | ICD-10-CM

## 2018-03-22 ENCOUNTER — Ambulatory Visit (INDEPENDENT_AMBULATORY_CARE_PROVIDER_SITE_OTHER): Payer: 59 | Admitting: Otolaryngology

## 2018-03-22 DIAGNOSIS — Z85819 Personal history of malignant neoplasm of unspecified site of lip, oral cavity, and pharynx: Secondary | ICD-10-CM

## 2018-03-26 ENCOUNTER — Other Ambulatory Visit (HOSPITAL_COMMUNITY): Payer: Self-pay | Admitting: Orthopedic Surgery

## 2018-03-26 DIAGNOSIS — M25512 Pain in left shoulder: Principal | ICD-10-CM

## 2018-03-26 DIAGNOSIS — G8929 Other chronic pain: Secondary | ICD-10-CM

## 2018-03-28 ENCOUNTER — Ambulatory Visit (HOSPITAL_COMMUNITY)
Admission: RE | Admit: 2018-03-28 | Discharge: 2018-03-28 | Disposition: A | Discharge status: Home / Self Care | Payer: PPO | Source: Ambulatory Visit | Attending: Orthopedic Surgery | Admitting: Orthopedic Surgery

## 2018-03-28 DIAGNOSIS — M659 Synovitis and tenosynovitis, unspecified: Secondary | ICD-10-CM | POA: Insufficient documentation

## 2018-03-28 DIAGNOSIS — G8929 Other chronic pain: Secondary | ICD-10-CM

## 2018-03-28 DIAGNOSIS — M25412 Effusion, left shoulder: Secondary | ICD-10-CM | POA: Insufficient documentation

## 2018-03-28 DIAGNOSIS — M19012 Primary osteoarthritis, left shoulder: Secondary | ICD-10-CM | POA: Insufficient documentation

## 2018-03-28 DIAGNOSIS — M25512 Pain in left shoulder: Secondary | ICD-10-CM | POA: Insufficient documentation

## 2018-03-29 ENCOUNTER — Telehealth: Payer: Self-pay | Admitting: Hematology and Oncology

## 2018-03-29 ENCOUNTER — Inpatient Hospital Stay (HOSPITAL_BASED_OUTPATIENT_CLINIC_OR_DEPARTMENT_OTHER): Payer: PPO | Admitting: Hematology and Oncology

## 2018-03-29 ENCOUNTER — Encounter: Payer: Self-pay | Admitting: Hematology and Oncology

## 2018-03-29 ENCOUNTER — Inpatient Hospital Stay: Payer: PPO | Attending: Hematology and Oncology

## 2018-03-29 DIAGNOSIS — R682 Dry mouth, unspecified: Secondary | ICD-10-CM | POA: Insufficient documentation

## 2018-03-29 DIAGNOSIS — R933 Abnormal findings on diagnostic imaging of other parts of digestive tract: Secondary | ICD-10-CM | POA: Insufficient documentation

## 2018-03-29 DIAGNOSIS — Z85810 Personal history of malignant neoplasm of tongue: Secondary | ICD-10-CM

## 2018-03-29 DIAGNOSIS — Z923 Personal history of irradiation: Secondary | ICD-10-CM | POA: Insufficient documentation

## 2018-03-29 DIAGNOSIS — K117 Disturbances of salivary secretion: Secondary | ICD-10-CM | POA: Diagnosis not present

## 2018-03-29 DIAGNOSIS — Z931 Gastrostomy status: Secondary | ICD-10-CM

## 2018-03-29 DIAGNOSIS — N179 Acute kidney failure, unspecified: Secondary | ICD-10-CM | POA: Insufficient documentation

## 2018-03-29 DIAGNOSIS — Y842 Radiological procedure and radiotherapy as the cause of abnormal reaction of the patient, or of later complication, without mention of misadventure at the time of the procedure: Secondary | ICD-10-CM | POA: Insufficient documentation

## 2018-03-29 DIAGNOSIS — R5382 Chronic fatigue, unspecified: Secondary | ICD-10-CM

## 2018-03-29 DIAGNOSIS — Z79899 Other long term (current) drug therapy: Secondary | ICD-10-CM | POA: Diagnosis not present

## 2018-03-29 DIAGNOSIS — R5383 Other fatigue: Secondary | ICD-10-CM | POA: Insufficient documentation

## 2018-03-29 DIAGNOSIS — C01 Malignant neoplasm of base of tongue: Secondary | ICD-10-CM

## 2018-03-29 DIAGNOSIS — K089 Disorder of teeth and supporting structures, unspecified: Secondary | ICD-10-CM

## 2018-03-29 LAB — CBC WITH DIFFERENTIAL/PLATELET
BASOS PCT: 1 %
Basophils Absolute: 0 10*3/uL (ref 0.0–0.1)
EOS ABS: 0.1 10*3/uL (ref 0.0–0.5)
Eosinophils Relative: 1 %
HEMATOCRIT: 47.1 % (ref 38.4–49.9)
Hemoglobin: 15.4 g/dL (ref 13.0–17.1)
LYMPHS ABS: 0.5 10*3/uL — AB (ref 0.9–3.3)
Lymphocytes Relative: 6 %
MCH: 29 pg (ref 27.2–33.4)
MCHC: 32.7 g/dL (ref 32.0–36.0)
MCV: 88.7 fL (ref 79.3–98.0)
MONO ABS: 0.5 10*3/uL (ref 0.1–0.9)
MONOS PCT: 7 %
Neutro Abs: 6.2 10*3/uL (ref 1.5–6.5)
Neutrophils Relative %: 85 %
Platelets: 199 10*3/uL (ref 140–400)
RBC: 5.31 MIL/uL (ref 4.20–5.82)
RDW: 13.9 % (ref 11.0–14.6)
WBC: 7.3 10*3/uL (ref 4.0–10.3)

## 2018-03-29 LAB — COMPREHENSIVE METABOLIC PANEL
ALBUMIN: 3.8 g/dL (ref 3.5–5.0)
ALT: 11 U/L (ref 0–55)
AST: 14 U/L (ref 5–34)
Alkaline Phosphatase: 92 U/L (ref 40–150)
Anion gap: 8 (ref 3–11)
BUN: 11 mg/dL (ref 7–26)
CALCIUM: 9.8 mg/dL (ref 8.4–10.4)
CO2: 28 mmol/L (ref 22–29)
CREATININE: 1.44 mg/dL — AB (ref 0.70–1.30)
Chloride: 103 mmol/L (ref 98–109)
GFR calc Af Amer: >60 mL/min (ref >60)
GFR calc non Af Amer: 52 mL/min — ABNORMAL LOW (ref >60)
GLUCOSE: 112 mg/dL (ref 70–140)
Potassium: 4.5 mmol/L (ref 3.5–5.1)
SODIUM: 139 mmol/L (ref 136–145)
Total Bilirubin: 0.4 mg/dL (ref 0.2–1.2)
Total Protein: 7.2 g/dL (ref 6.4–8.3)

## 2018-03-29 LAB — TSH: TSH: 2.208 u[IU]/mL (ref 0.320–4.118)

## 2018-03-29 NOTE — Assessment & Plan Note (Signed)
He has stable chronic kidney disease Observe only.

## 2018-03-29 NOTE — Assessment & Plan Note (Signed)
He has chronic dry mouth secondary to radiation exposure He will continue frequent oral fluid intake

## 2018-03-29 NOTE — Assessment & Plan Note (Signed)
He has poor dentition secondary to exposure to radiation therapy We discussed the importance of aggressive oral hygiene

## 2018-03-29 NOTE — Assessment & Plan Note (Signed)
Clinically, he has no signs or symptoms of disease recurrence I will see him back again in 6 months I reinforced the importance of ENT follow-up

## 2018-03-29 NOTE — Assessment & Plan Note (Signed)
He has abnormal biopsy from colonoscopy I gave him a printout of the pathology report and reinforced the importance of scheduling a follow-up colonoscopy as soon as possible

## 2018-03-29 NOTE — Telephone Encounter (Signed)
Gave avs and calendar ° °

## 2018-03-29 NOTE — Assessment & Plan Note (Signed)
He has chronic fatigue of unknown etiology Hemoglobin is normal TSH is within normal limits I recommend close follow-up with primary care doctor

## 2018-03-29 NOTE — Progress Notes (Signed)
[place] OFFICE PROGRESS NOTE  Patient Care Team: Sharilyn Sites, MD as PCP - General (Family Medicine) Leta Baptist, MD as Consulting Physician (Otolaryngology) Heath Lark, MD as Consulting Physician (Hematology and Oncology) Eppie Gibson, MD as Attending Physician (Radiation Oncology)  ASSESSMENT & PLAN:  Cancer of base of tongue ([place]) Clinically, he has no signs or symptoms of disease recurrence I will see him back again in 6 months I reinforced the importance of ENT follow-up  Prerenal acute renal failure [facility]) He has stable chronic kidney disease Observe only.  Poor dentition He has poor dentition secondary to exposure to radiation therapy We discussed the importance of aggressive oral hygiene  Xerostomia due to radiotherapy He has chronic dry mouth secondary to radiation exposure He will continue frequent oral fluid intake   Other fatigue He has chronic fatigue of unknown etiology Hemoglobin is normal TSH is within normal limits I recommend close follow-up with primary care doctor  Abnormal colonoscopy He has abnormal biopsy from colonoscopy I gave him a printout of the pathology report and reinforced the importance of scheduling a follow-up colonoscopy as soon as possible   No orders of the defined types were placed in this encounter.   INTERVAL HISTORY: Please see below for problem oriented charting. He returns with his wife for further follow-up He complained of excessive fatigue He also have chronic dry mouth and poor dentition secondary to radiation treatment He denies new tongue lesions He saw ENT physician recently for follow-up He has yet to make a follow-up appointment for repeat colonoscopy for abnormal colonoscopy last year He denies changes in bowel habits  SUMMARY OF ONCOLOGIC HISTORY: Oncology History   Stage IVA (T1, N2b, M0) squamous cell carcinoma of base of tongue HPV (+)     Cancer of base of tongue ([place])   12/02/2015  Imaging    Ct neck showed right base of tongue cancer and large right neck cervical lymphadenopathy      12/10/2015 Pathology Results    Accession: [id number] right tongue base and right neck mass biopsy were positive for invasive squamous cell cancer, HPV positive      12/10/2015 Procedure    He underwent laryngoscopy and biopsy of base of tongue and excision of right neck mass      01/11/2016 Imaging    PET scan: Hypermetabolic right tongue lesion without evidence of metastatic disease. A necrotic appearing right level-II lymph node, seen on 12/02/2015, is no longer visualized. 2. 1.8 cm low-attenuation lesion in the left lobe of the thyroid, nonspecific      01/22/2016 Procedure    Gastrostomy tube and port-a-cath placed.      01/25/2016 - 03/15/2016 Radiation Therapy    IMRT to BOT/Bilat neck completed [name]). Base of tongue 70 Gy in 35 fractions. High-risk neck nodal echeleons 63 Gy in 35 fractions. Intermediate-risk neck nodal echelons 56 Gy in 35 fractions      01/27/2016 - 01/27/2016 Chemotherapy    He received only 1 dose of cisplatin, complicated by uncontrolled nausea, vomiting, diarrhea and tinnitus. After much discussion, decision was made to discontinue chemotherapy permanently      05/16/2016 Procedure    Gastrostomy tube and port-a-cath removed.      07/07/2016 PET scan    Restaging PET:  1. Resolution of hypermetabolic activity in the RIGHT hypopharynx.  2. No hypermetabolic cervical lymph nodes.  3. No evidence of distant metastatic disease.        REVIEW OF SYSTEMS:   Constitutional:  Denies fevers, chills or abnormal weight loss Eyes: Denies blurriness of vision Ears, nose, mouth, throat, and face: Denies mucositis or sore throat Respiratory: Denies cough, dyspnea or wheezes Cardiovascular: Denies palpitation, chest discomfort or lower extremity swelling Gastrointestinal:  Denies nausea, heartburn or change in bowel habits Skin: Denies abnormal skin  rashes Lymphatics: Denies new lymphadenopathy or easy bruising Neurological:Denies numbness, tingling or new weaknesses Behavioral/Psych: Mood is stable, no new changes  All other systems were reviewed with the patient and are negative.  I have reviewed the past medical history, past surgical history, social history and family history with the patient and they are unchanged from previous note.  ALLERGIES:  is allergic to tetracyclines & related.  MEDICATIONS:  Current Outpatient Medications  Medication Sig Dispense Refill  . diazepam (VALIUM) 10 MG tablet Take 1 tablet (10 mg total) by mouth at bedtime as needed for anxiety or sleep. Reported on 02/16/2016 30 tablet 0  . HYDROcodone-acetaminophen (NORCO/VICODIN) 5-325 MG tablet Take 1 tablet by mouth every 8 (eight) hours as needed for moderate pain.    . Na Sulfate-K Sulfate-Mg Sulf (SUPREP BOWEL PREP KIT) 17.5-3.13-1.6 GM/180ML SOLN Take 1 kit by mouth as directed. 1 Bottle 0  . pravastatin (PRAVACHOL) 20 MG tablet Take 20 mg by mouth daily. Reported on 03/14/2016    . PROCTO-MED HC 2.5 % rectal cream PLACE 1 APPLICATION RECTALLY TWICE DAILY. 30 g 0  . ranitidine (ZANTAC) 150 MG tablet Take 150 mg by mouth daily as needed for heartburn.    . sodium fluoride (FLUORISHIELD) 1.1 % GEL dental gel Instill one drop of gel per tooth space of tray. Place over teeth for 5 minutes. Remove. Spit out excess. Repeat nightly. (Patient taking differently: Place 1 application onto teeth at bedtime. Instill one drop of gel per tooth space of tray. Place over teeth for 5 minutes. Remove. Spit out excess. Repeat nightly.) 120 mL prn   No current facility-administered medications for this visit.     PHYSICAL EXAMINATION: ECOG PERFORMANCE STATUS: 1 - Symptomatic but completely ambulatory  Vitals:   03/29/18 1145  BP: (!) 146/99  Pulse: 77  Resp: 18  Temp: 97.8 F (36.6 C)  SpO2: 100%   Filed Weights   03/29/18 1145  Weight: 221 lb 12.8 oz (100.6 kg)     GENERAL:alert, no distress and comfortable SKIN: skin color, texture, turgor are normal, no rashes or significant lesions EYES: normal, Conjunctiva are pink and non-injected, sclera clear OROPHARYNX:no exudate, no erythema and lips, buccal mucosa, and tongue normal.  Poor dentition noted  nECK: Noted lymphedema around his neck from prior radiation  lYMPH:  no palpable lymphadenopathy in the cervical, axillary or inguinal LUNGS: clear to auscultation and percussion with normal breathing effort HEART: regular rate & rhythm and no murmurs and no lower extremity edema ABDOMEN:abdomen soft, non-tender and normal bowel sounds Musculoskeletal:no cyanosis of digits and no clubbing  NEURO: alert & oriented x 3 with fluent speech, no focal motor/sensory deficits  LABORATORY DATA:  I have reviewed the data as listed    Component Value Date/Time   NA 139 03/29/2018 1105   NA 138 12/01/2016 1403   K 4.5 03/29/2018 1105   K 4.0 12/01/2016 1403   CL 103 03/29/2018 1105   CO2 28 03/29/2018 1105   CO2 30 (H) 12/01/2016 1403   GLUCOSE 112 03/29/2018 1105   GLUCOSE 94 12/01/2016 1403   BUN 11 03/29/2018 1105   BUN 11.9 12/01/2016 1403   CREATININE 1.44 (H)  03/29/2018 1105   CREATININE 1.4 (H) 12/01/2016 1403   CALCIUM 9.8 03/29/2018 1105   CALCIUM 9.7 12/01/2016 1403   PROT 7.2 03/29/2018 1105   PROT 7.2 12/01/2016 1403   ALBUMIN 3.8 03/29/2018 1105   ALBUMIN 3.5 12/01/2016 1403   AST 14 03/29/2018 1105   AST 14 12/01/2016 1403   ALT 11 03/29/2018 1105   ALT 9 12/01/2016 1403   ALKPHOS 92 03/29/2018 1105   ALKPHOS 96 12/01/2016 1403   BILITOT 0.4 03/29/2018 1105   BILITOT 0.36 12/01/2016 1403   GFRNONAA 52 (L) 03/29/2018 1105   GFRAA >60 03/29/2018 1105    No results found for: SPEP, UPEP  Lab Results  Component Value Date   WBC 7.3 03/29/2018   NEUTROABS 6.2 03/29/2018   HGB 15.4 03/29/2018   HCT 47.1 03/29/2018   MCV 88.7 03/29/2018   PLT 199 03/29/2018      Chemistry       Component Value Date/Time   NA 139 03/29/2018 1105   NA 138 12/01/2016 1403   K 4.5 03/29/2018 1105   K 4.0 12/01/2016 1403   CL 103 03/29/2018 1105   CO2 28 03/29/2018 1105   CO2 30 (H) 12/01/2016 1403   BUN 11 03/29/2018 1105   BUN 11.9 12/01/2016 1403   CREATININE 1.44 (H) 03/29/2018 1105   CREATININE 1.4 (H) 12/01/2016 1403      Component Value Date/Time   CALCIUM 9.8 03/29/2018 1105   CALCIUM 9.7 12/01/2016 1403   ALKPHOS 92 03/29/2018 1105   ALKPHOS 96 12/01/2016 1403   AST 14 03/29/2018 1105   AST 14 12/01/2016 1403   ALT 11 03/29/2018 1105   ALT 9 12/01/2016 1403   BILITOT 0.4 03/29/2018 1105   BILITOT 0.36 12/01/2016 1403       RADIOGRAPHIC STUDIES: I have personally reviewed the radiological images as listed and agreed with the findings in the report. Mr Shoulder Left Wo Contrast  Result Date: 03/28/2018 CLINICAL DATA:  Left shoulder pain for 10 months. EXAM: MRI OF THE LEFT SHOULDER WITHOUT CONTRAST TECHNIQUE: Multiplanar, multisequence MR imaging of the shoulder was performed. No intravenous contrast was administered. COMPARISON:  None. FINDINGS: Rotator cuff: Mild to moderate rotator cuff tendinopathy/tendinosis. No partial or full-thickness rotator cuff tear. Muscles:  Normal Biceps long head:  Intact Acromioclavicular Joint: Mild AC joint degenerative changes. Type 1 acromion. Significant lateral downsloping of the acromion. Glenohumeral Joint: Small joint effusion and significant rotator interval and axillary recess synovitis. Labrum:  No definite labral tears. Bones:  No acute bony findings Other: No subacromial/subdeltoid fluid collections to suggest bursitis. IMPRESSION: 1. Mild to moderate rotator cuff tendinopathy/tendinosis but no partial or full-thickness tear. 2. Intact long head biceps tendon and glenoid labrum. 3. Significant lateral downsloping of a type 1 acromion possibly contributing to bony impingement. Mild AC joint degenerative changes are also  noted. 4. Small glenohumeral joint effusion and significant rotator interval and axillary recess synovitis. Electronically Signed   By: Marijo Sanes M.D.   On: 03/28/2018 10:13    All questions were answered. The patient knows to call the clinic with any problems, questions or concerns. No barriers to learning was detected.  I spent 15 minutes counseling the patient face to face. The total time spent in the appointment was 20 minutes and more than 50% was on counseling and review of test results  Heath Lark, MD 03/29/2018 12:55 PM

## 2018-04-03 DIAGNOSIS — Z6829 Body mass index (BMI) 29.0-29.9, adult: Secondary | ICD-10-CM | POA: Diagnosis not present

## 2018-04-03 DIAGNOSIS — G894 Chronic pain syndrome: Secondary | ICD-10-CM | POA: Diagnosis not present

## 2018-04-03 DIAGNOSIS — E663 Overweight: Secondary | ICD-10-CM | POA: Diagnosis not present

## 2018-04-03 DIAGNOSIS — R030 Elevated blood-pressure reading, without diagnosis of hypertension: Secondary | ICD-10-CM | POA: Diagnosis not present

## 2018-04-06 DIAGNOSIS — M25512 Pain in left shoulder: Secondary | ICD-10-CM | POA: Diagnosis not present

## 2018-05-15 DIAGNOSIS — G894 Chronic pain syndrome: Secondary | ICD-10-CM | POA: Diagnosis not present

## 2018-05-15 DIAGNOSIS — E663 Overweight: Secondary | ICD-10-CM | POA: Diagnosis not present

## 2018-05-15 DIAGNOSIS — Z6829 Body mass index (BMI) 29.0-29.9, adult: Secondary | ICD-10-CM | POA: Diagnosis not present

## 2018-06-15 ENCOUNTER — Telehealth: Payer: Self-pay | Admitting: *Deleted

## 2018-06-15 ENCOUNTER — Encounter: Payer: Self-pay | Admitting: *Deleted

## 2018-06-15 ENCOUNTER — Encounter

## 2018-06-15 ENCOUNTER — Encounter: Payer: Self-pay | Admitting: Gastroenterology

## 2018-06-15 ENCOUNTER — Ambulatory Visit (INDEPENDENT_AMBULATORY_CARE_PROVIDER_SITE_OTHER): Payer: PPO | Admitting: Gastroenterology

## 2018-06-15 ENCOUNTER — Other Ambulatory Visit: Payer: Self-pay | Admitting: *Deleted

## 2018-06-15 VITALS — BP 141/88 | HR 70 | Temp 97.0°F | Ht 75.0 in | Wt 217.0 lb

## 2018-06-15 DIAGNOSIS — R1310 Dysphagia, unspecified: Secondary | ICD-10-CM | POA: Insufficient documentation

## 2018-06-15 DIAGNOSIS — Z86010 Personal history of colonic polyps: Secondary | ICD-10-CM

## 2018-06-15 DIAGNOSIS — D126 Benign neoplasm of colon, unspecified: Secondary | ICD-10-CM

## 2018-06-15 MED ORDER — PEG 3350-KCL-NA BICARB-NACL 420 G PO SOLR: 4000.0000 mL | Freq: Once | ORAL | 0 refills | Status: AC

## 2018-06-15 NOTE — Assessment & Plan Note (Signed)
57 year old male with numerous 4-13 mm adenomas on colonoscopy in Aug 2018, due for early interval now. Remote history of adenomas as well. No concerning lower GI symptoms.  Proceed with TCS with Dr. Gala Romney in near future: the risks, benefits, and alternatives have been discussed with the patient in detail. The patient states understanding and desires to proceed. Propofol due to polypharmacy

## 2018-06-15 NOTE — Progress Notes (Signed)
Referring Provider: Sharilyn Sites, MD Primary Care Physician:  Sharilyn Sites, MD  Primary GI: Dr. Gala Romney   Chief Complaint  Patient presents with  . Colonoscopy    consult    HPI:   [name] Best is a 57 y.o. male presenting today to arrange early interval colonoscopy due to multiple 4-13 mm adenomas in Aug 2018. Previous history of adenomas in 2014 as well. He was diagnosed with Stage IVA squamous cell carcinoma of base of tongue in Dec 2016, undergoing chemo and radiation.   States there have been occasional issues with solid food dysphagia. He swallowed steak recently and had to have the heimlich performed by his son. States he tried to drink water but it came right back up. Eventually, steak was dislodged. Felt his life flash before his eyes. Has had a few episodes of random vomiting in the past few months, and he is unsure what precipitated this. Prilosec once daily. If eats meat, will have issues with dysphagia. Tries to cut into small pieces, chew well.   No concerning lower GI symptoms.   Past Medical History:  Diagnosis Date  . Anxiety   . C. difficile diarrhea 2014   treated with Flagyl  . Cancer ([place])   . Chronic back pain   . Diverticulitis    hospitalized in 2003  . Hx of radiation therapy 01/25/16- 03/15/16   Base of Tongue, bilateral neck  . Hyperlipidemia   . PONV (postoperative nausea and vomiting)     Past Surgical History:  Procedure Laterality Date  . CHOLECYSTECTOMY    . COLONOSCOPY N/A 07/02/2013   Jenkins:Sessile polyp (tubular adenoma) ranging between 3-21mm in size in the proximal sigmoid colon/mild diverticulosis   . COLONOSCOPY     Dr. Laural Golden: prior to 2003 per patient  . COLONOSCOPY WITH PROPOFOL N/A 08/07/2017   Dr. Gala Romney: multiple 4-13 mm polyps in rectum, sigmoid, descending, transverse, and cecum. adenomas. diverticulosis in sigmoid and descending  . DIRECT LARYNGOSCOPY N/A 12/10/2015   Procedure: DIRECT LARYNGOSCOPY;  Surgeon: Leta Baptist,  MD;  Location: [facility];  Service: ENT;  Laterality: N/A;  . ESOPHAGOGASTRODUODENOSCOPY N/A 07/02/2013   Jenkins:4 cm hiatal hernia/esophagus appeared normal/chronic gastritis. Clotest negative.  Marland Kitchen GASTROSTOMY TUBE CHANGE     placement and removal during chemo/radiation. no longer present  . KNEE ARTHROSCOPY WITH MEDIAL MENISECTOMY Left 04/14/2015   Procedure: KNEE ARTHROSCOPY WITH MEDIAL MENISECTOMY;  Surgeon: Sanjuana Kava, MD;  Location: [facility];  Service: Orthopedics;  Laterality: Left;  . left elbow    . MASS BIOPSY Right 12/10/2015   Procedure: RIGHT EXCISIONAL NECK MASS BIOPSY;  Surgeon: Leta Baptist, MD;  Location: [place];  Service: ENT;  Laterality: Right;  . POLYPECTOMY  08/07/2017   Procedure: POLYPECTOMY;  Surgeon: Daneil Dolin, MD;  Location: [facility];  Service: Endoscopy;;  colon  . TONGUE BIOPSY Right 12/10/2015   Procedure: TONGUE BIOPSY;  Surgeon: Leta Baptist, MD;  Location: [facility];  Service: ENT;  Laterality: Right;    Current Outpatient Medications  Medication Sig Dispense Refill  . diazepam (VALIUM) 10 MG tablet Take 1 tablet (10 mg total) by mouth at bedtime as needed for anxiety or sleep. Reported on 02/16/2016 30 tablet 0  . HYDROcodone-acetaminophen (NORCO) 7.5-325 MG tablet Take 1 tablet by mouth as needed for moderate pain.    Marland Kitchen omeprazole (PRILOSEC) 20 MG capsule Take 20 mg by mouth daily.    . pravastatin (  PRAVACHOL) 40 MG tablet Take 40 mg by mouth daily.    Marland Kitchen PROCTO-MED HC 2.5 % rectal cream PLACE 1 APPLICATION RECTALLY TWICE DAILY. (Patient taking differently: PLACE 1 APPLICATION RECTALLY TWICE DAILY. Takes as needed) 30 g 0  . sodium fluoride (FLUORISHIELD) 1.1 % GEL dental gel Instill one drop of gel per tooth space of tray. Place over teeth for 5 minutes. Remove. Spit out excess. Repeat nightly. (Patient taking differently: Place 1 application onto teeth at bedtime. Instill one drop of gel per tooth space of  tray. Place over teeth for 5 minutes. Remove. Spit out excess. Repeat nightly.) 120 mL prn   No current facility-administered medications for this visit.     Allergies as of 06/15/2018 - Review Complete 03/29/2018  Allergen Reaction Noted  . Tetracyclines & related Hives 08/16/2011    Family History  Problem Relation Age of Onset  . Heart failure Mother 49  . Heart attack Father 82  . Heart disease Unknown   . Cancer Paternal Uncle        died of cancer  . Colon cancer Neg Hx   . Colon polyps Neg Hx     Social History   Socioeconomic History  . Marital status: Married    Spouse name: Not on file  . Number of children: 2  . Years of education: 12th grade  . Highest education level: Not on file  Occupational History  . Occupation: retired  Scientific laboratory technician  . Financial resource strain: Not on file  . Food insecurity:    Worry: Not on file    Inability: Not on file  . Transportation needs:    Medical: Not on file    Non-medical: Not on file  Tobacco Use  . Smoking status: Never Smoker  . Smokeless tobacco: Never Used  Substance and Sexual Activity  . Alcohol use: No  . Drug use: Yes    Types: Marijuana    Comment: "sometimes for appetite"  . Sexual activity: Not on file    Comment: married, retired, 1 son & 1 daughter  Lifestyle  . Physical activity:    Days per week: Not on file    Minutes per session: Not on file  . Stress: Not on file  Relationships  . Social connections:    Talks on phone: Not on file    Gets together: Not on file    Attends religious service: Not on file    Active member of club or organization: Not on file    Attends meetings of clubs or organizations: Not on file    Relationship status: Not on file  Other Topics Concern  . Not on file  Social History Narrative   Lives with mother.      Review of Systems: Gen: Denies fever, chills, anorexia. Denies fatigue, weakness, weight loss.  CV: Denies chest pain, palpitations, syncope,  peripheral edema, and claudication. Resp: Denies dyspnea at rest, cough, wheezing, coughing up blood, and pleurisy. GI: see HPI  Derm: Denies rash, itching, dry skin Psych: Denies depression, anxiety, memory loss, confusion. No homicidal or suicidal ideation.  Heme: Denies bruising, bleeding, and enlarged lymph nodes.  Physical Exam: BP (!) 141/88   Pulse 70   Temp (!) 97 F (36.1 C) (Oral)   Ht 6\' 3"  (1.905 m)   Wt 217 lb (98.4 kg)   BMI 27.12 kg/m  General:   Alert and oriented. No distress noted. Pleasant and cooperative.  Head:  Normocephalic and atraumatic. Eyes:  Conjuctiva clear without scleral icterus. Mouth:  Oral mucosa pink and moist. Poor dentition Cardiac: S1 S2 present without murmurs  Lungs: clear bilaterally  Abdomen:  +BS, soft, non-tender and non-distended. No rebound or guarding. No HSM or masses noted. Msk:  Symmetrical without gross deformities. Normal posture. Extremities:  Without edema. Neurologic:  Alert and  oriented x4 Psych:  Alert and cooperative. Normal mood and affect.

## 2018-06-15 NOTE — Progress Notes (Signed)
cc'ed to pcp °

## 2018-06-15 NOTE — Telephone Encounter (Signed)
Pre-op scheduled for 08/23/18 at 10:00am. Letter mailed. Patient aware

## 2018-06-15 NOTE — Assessment & Plan Note (Signed)
Insidious onset of dysphagia with recent reports of likely food impaction several weeks ago. History of squamous cell carcinoma of base of tongue in Dec 2016, undergoing chemo and radiation. He is followed closely by oncology. Prilosec daily for GERD. EGD las in 2014, but this was prior to diagnosis. Query late radiation effects, possible stricture, doubt malignancy. Discussed EGD/dilation at time of colonoscopy. We discussed in detail dietary and behavior modifications in the interim to avoid a recurrent episode.  Proceed with upper endoscopy/dilation in the near future with Dr. Gala Romney. The risks, benefits, and alternatives have been discussed in detail with patient. They have stated understanding and desire to proceed.  Propofol due to polypharmacy Continue Prilosec

## 2018-06-15 NOTE — Patient Instructions (Signed)
We have arranged the colonoscopy with Dr. Gala Romney in the near future. We will also have you undergo the upper endoscopy with possible dilation if needed.  Chew very well, take very small bites, sit upright while eating. Avoid any tough textures. Continue omeprazole each morning, 30 minutes before breakfast.  It was a pleasure to see you today. I strive to create trusting relationships with patients to provide genuine, compassionate, and quality care. I value your feedback. If you receive a survey regarding your visit,  I greatly appreciate you taking time to fill this out.   Annitta Needs, PhD, ANP-BC [facility]

## 2018-06-21 DIAGNOSIS — Z1389 Encounter for screening for other disorder: Secondary | ICD-10-CM | POA: Diagnosis not present

## 2018-06-21 DIAGNOSIS — Z6828 Body mass index (BMI) 28.0-28.9, adult: Secondary | ICD-10-CM | POA: Diagnosis not present

## 2018-06-21 DIAGNOSIS — G894 Chronic pain syndrome: Secondary | ICD-10-CM | POA: Diagnosis not present

## 2018-06-21 DIAGNOSIS — E663 Overweight: Secondary | ICD-10-CM | POA: Diagnosis not present

## 2018-06-27 DIAGNOSIS — R1032 Left lower quadrant pain: Secondary | ICD-10-CM | POA: Diagnosis not present

## 2018-06-27 DIAGNOSIS — E663 Overweight: Secondary | ICD-10-CM | POA: Diagnosis not present

## 2018-06-27 DIAGNOSIS — K5732 Diverticulitis of large intestine without perforation or abscess without bleeding: Secondary | ICD-10-CM | POA: Diagnosis not present

## 2018-06-27 DIAGNOSIS — Z6828 Body mass index (BMI) 28.0-28.9, adult: Secondary | ICD-10-CM | POA: Diagnosis not present

## 2018-07-24 DIAGNOSIS — K219 Gastro-esophageal reflux disease without esophagitis: Secondary | ICD-10-CM | POA: Diagnosis not present

## 2018-07-24 DIAGNOSIS — G894 Chronic pain syndrome: Secondary | ICD-10-CM | POA: Diagnosis not present

## 2018-07-24 DIAGNOSIS — Z6828 Body mass index (BMI) 28.0-28.9, adult: Secondary | ICD-10-CM | POA: Diagnosis not present

## 2018-08-20 NOTE — Patient Instructions (Signed)
[name] Best  08/20/2018     @PREFPERIOPPHARMACY @   Your procedure is scheduled on  08/30/2018   Report to [facility] at  615  A.M.  Call this number if you have problems the morning of surgery:  [phone]   Remember:  Do not eat or drink after midnight.  You may drink clear liquids until ( follow the instructions given to you) .  Clear liquids allowed are:                    Water, Juice (non-citric and without pulp), Carbonated beverages, Clear Tea, Black Coffee only, Plain Jell-O only, Gatorade and Plain Popsicles only    Take these medicines the morning of surgery with A SIP OF WATER  Valium( if needed), hydrocodone (if needed), protonix.    Do not wear jewelry, make-up or nail polish.  Do not wear lotions, powders, or perfumes, or deodorant.  Do not shave 48 hours prior to surgery.  Men may shave face and neck.  Do not bring valuables to the hospital.  [facility] is not responsible for any belongings or valuables.  Contacts, dentures or bridgework may not be worn into surgery.  Leave your suitcase in the car.  After surgery it may be brought to your room.  For patients admitted to the hospital, discharge time will be determined by your treatment team.  Patients discharged the day of surgery will not be allowed to drive home.   Name and phone number of your driver:   family Special instructions:  Follow the diet and prep instructions given to you by Dr Margette Fast office.  Please read over the following fact sheets that you were given. Anesthesia Post-op Instructions and Care and Recovery After Surgery       Esophagogastroduodenoscopy Esophagogastroduodenoscopy (EGD) is a procedure to examine the lining of the esophagus, stomach, and first part of the small intestine (duodenum). This procedure is done to check for problems such as inflammation, bleeding, ulcers, or growths. During this procedure, a long, flexible, lighted tube with a camera  attached (endoscope) is inserted down the throat. Tell a health care provider about:  Any allergies you have.  All medicines you are taking, including vitamins, herbs, eye drops, creams, and over-the-counter medicines.  Any problems you or family members have had with anesthetic medicines.  Any blood disorders you have.  Any surgeries you have had.  Any medical conditions you have.  Whether you are pregnant or may be pregnant. What are the risks? Generally, this is a safe procedure. However, problems may occur, including:  Infection.  Bleeding.  A tear (perforation) in the esophagus, stomach, or duodenum.  Trouble breathing.  Excessive sweating.  Spasms of the larynx.  A slowed heartbeat.  Low blood pressure.  What happens before the procedure?  Follow instructions from your health care provider about eating or drinking restrictions.  Ask your health care provider about: ? Changing or stopping your regular medicines. This is especially important if you are taking diabetes medicines or blood thinners. ? Taking medicines such as aspirin and ibuprofen. These medicines can thin your blood. Do not take these medicines before your procedure if your health care provider instructs you not to.  Plan to have someone take you home after the procedure.  If you wear dentures, be ready to remove them before the procedure. What happens during the procedure?  To  reduce your risk of infection, your health care team will wash or sanitize their hands.  An IV tube will be put in a vein in your hand or arm. You will get medicines and fluids through this tube.  You will be given one or more of the following: ? A medicine to help you relax (sedative). ? A medicine to numb the area (local anesthetic). This medicine may be sprayed into your throat. It will make you feel more comfortable and keep you from gagging or coughing during the procedure. ? A medicine for pain.  A mouth guard  may be placed in your mouth to protect your teeth and to keep you from biting on the endoscope.  You will be asked to lie on your left side.  The endoscope will be lowered down your throat into your esophagus, stomach, and duodenum.  Air will be put into the endoscope. This will help your health care provider see better.  The lining of your esophagus, stomach, and duodenum will be examined.  Your health care provider may: ? Take a tissue sample so it can be looked at in a lab (biopsy). ? Remove growths. ? Remove objects (foreign bodies) that are stuck. ? Treat any bleeding with medicines or other devices that stop tissue from bleeding. ? Widen (dilate) or stretch narrowed areas of your esophagus and stomach.  The endoscope will be taken out. The procedure may vary among health care providers and hospitals. What happens after the procedure?  Your blood pressure, heart rate, breathing rate, and blood oxygen level will be monitored often until the medicines you were given have worn off.  Do not eat or drink anything until the numbing medicine has worn off and your gag reflex has returned. This information is not intended to replace advice given to you by your health care provider. Make sure you discuss any questions you have with your health care provider. Document Released: 04/14/2005 Document Revised: 05/19/2016 Document Reviewed: 11/05/2015 Elsevier Interactive Patient Education  2018 [employer]. Esophagogastroduodenoscopy, Care After Refer to this sheet in the next few weeks. These instructions provide you with information about caring for yourself after your procedure. Your health care provider may also give you more specific instructions. Your treatment has been planned according to current medical practices, but problems sometimes occur. Call your health care provider if you have any problems or questions after your procedure. What can I expect after the procedure? After the  procedure, it is common to have:  A sore throat.  Nausea.  Bloating.  Dizziness.  Fatigue.  Follow these instructions at home:  Do not eat or drink anything until the numbing medicine (local anesthetic) has worn off and your gag reflex has returned. You will know that the local anesthetic has worn off when you can swallow comfortably.  Do not drive for 24 hours if you received a medicine to help you relax (sedative).  If your health care provider took a tissue sample for testing during the procedure, make sure to get your test results. This is your responsibility. Ask your health care provider or the department performing the test when your results will be ready.  Keep all follow-up visits as told by your health care provider. This is important. Contact a health care provider if:  You cannot stop coughing.  You are not urinating.  You are urinating less than usual. Get help right away if:  You have trouble swallowing.  You cannot eat or drink.  You have  throat or chest pain that gets worse.  You are dizzy or light-headed.  You faint.  You have nausea or vomiting.  You have chills.  You have a fever.  You have severe abdominal pain.  You have black, tarry, or bloody stools. This information is not intended to replace advice given to you by your health care provider. Make sure you discuss any questions you have with your health care provider. Document Released: 11/28/2012 Document Revised: 05/19/2016 Document Reviewed: 11/05/2015 Elsevier Interactive Patient Education  2018 [employer].  Esophageal Dilatation Esophageal dilatation is a procedure to open a blocked or narrowed part of the esophagus. The esophagus is the long tube in your throat that carries food and liquid from your mouth to your stomach. The procedure is also called esophageal dilation. You may need this procedure if you have a buildup of scar tissue in your esophagus that makes it difficult,  painful, or even impossible to swallow. This can be caused by gastroesophageal reflux disease (GERD). In rare cases, people need this procedure because they have cancer of the esophagus or a problem with the way food moves through the esophagus. Sometimes you may need to have another dilatation to enlarge the opening of the esophagus gradually. Tell a health care provider about:  Any allergies you have.  All medicines you are taking, including vitamins, herbs, eye drops, creams, and over-the-counter medicines.  Any problems you or family members have had with anesthetic medicines.  Any blood disorders you have.  Any surgeries you have had.  Any medical conditions you have.  Any antibiotic medicines you are required to take before dental procedures. What are the risks? Generally, this is a safe procedure. However, problems can occur and include:  Bleeding from a tear in the lining of the esophagus.  A hole (perforation) in the esophagus.  What happens before the procedure?  Do not eat or drink anything after midnight on the night before the procedure or as directed by your health care provider.  Ask your health care provider about changing or stopping your regular medicines. This is especially important if you are taking diabetes medicines or blood thinners.  Plan to have someone take you home after the procedure. What happens during the procedure?  You will be given a medicine that makes you relaxed and sleepy (sedative).  A medicine may be sprayed or gargled to numb the back of the throat.  Your health care provider can use various instruments to do an esophageal dilatation. During the procedure, the instrument used will be placed in your mouth and passed down into your esophagus. Options include: ? Simple dilators. This instrument is carefully placed in the esophagus to stretch it. ? Guided wire bougies. In this method, a flexible tube (endoscope) is used to insert a wire into  the esophagus. The dilator is passed over this wire to enlarge the esophagus. Then the wire is removed. ? Balloon dilators. An endoscope with a small balloon at the end is passed down into the esophagus. Inflating the balloon gently stretches the esophagus and opens it up. What happens after the procedure?  Your blood pressure, heart rate, breathing rate, and blood oxygen level will be monitored often until the medicines you were given have worn off.  Your throat may feel slightly sore and will probably still feel numb. This will improve slowly over time.  You will not be allowed to eat or drink until the throat numbness has resolved.  If this is a same-day  procedure, you may be allowed to go home once you have been able to drink, urinate, and sit on the edge of the bed without nausea or dizziness.  If this is a same-day procedure, you should have a friend or family member with you for the next 24 hours after the procedure. This information is not intended to replace advice given to you by your health care provider. Make sure you discuss any questions you have with your health care provider. Document Released: 02/02/2006 Document Revised: 05/19/2016 Document Reviewed: 04/23/2014 Elsevier Interactive Patient Education  [employer].  Colonoscopy, Adult A colonoscopy is an exam to look at the large intestine. It is done to check for problems, such as:  Lumps (tumors).  Growths (polyps).  Swelling (inflammation).  Bleeding.  What happens before the procedure? Eating and drinking Follow instructions from your doctor about eating and drinking. These instructions may include:  A few days before the procedure - follow a low-fiber diet. ? Avoid nuts. ? Avoid seeds. ? Avoid dried fruit. ? Avoid raw fruits. ? Avoid vegetables.  1-3 days before the procedure - follow a clear liquid diet. Avoid liquids that have red or purple dye. Drink only clear liquids, such as: ? Clear broth or  bouillon. ? Black coffee or tea. ? Clear juice. ? Clear soft drinks or sports drinks. ? Gelatin dessert. ? Popsicles.  On the day of the procedure - do not eat or drink anything during the 2 hours before the procedure.  Bowel prep If you were prescribed an oral bowel prep:  Take it as told by your doctor. Starting the day before your procedure, you will need to drink a lot of liquid. The liquid will cause you to poop (have bowel movements) until your poop is almost clear or light green.  If your skin or butt gets irritated from diarrhea, you may: ? Wipe the area with wipes that have medicine in them, such as adult wet wipes with aloe and vitamin E. ? Put something on your skin that soothes the area, such as petroleum jelly.  If you throw up (vomit) while drinking the bowel prep, take a break for up to 60 minutes. Then begin the bowel prep again. If you keep throwing up and you cannot take the bowel prep without throwing up, call your doctor.  General instructions  Ask your doctor about changing or stopping your normal medicines. This is important if you take diabetes medicines or blood thinners.  Plan to have someone take you home from the hospital or clinic. What happens during the procedure?  An IV tube may be put into one of your veins.  You will be given medicine to help you relax (sedative).  To reduce your risk of infection: ? Your doctors will wash their hands. ? Your anal area will be washed with soap.  You will be asked to lie on your side with your knees bent.  Your doctor will get a long, thin, flexible tube ready. The tube will have a camera and a light on the end.  The tube will be put into your anus.  The tube will be gently put into your large intestine.  Air will be delivered into your large intestine to keep it open. You may feel some pressure or cramping.  The camera will be used to take photos.  A small tissue sample may be removed from your body to  be looked at under a microscope (biopsy). If any possible problems are  found, the tissue will be sent to a lab for testing.  If small growths are found, your doctor may remove them and have them checked for cancer.  The tube that was put into your anus will be slowly removed. The procedure may vary among doctors and hospitals. What happens after the procedure?  Your doctor will check on you often until the medicines you were given have worn off.  Do not drive for 24 hours after the procedure.  You may have a small amount of blood in your poop.  You may pass gas.  You may have mild cramps or bloating in your belly (abdomen).  It is up to you to get the results of your procedure. Ask your doctor, or the department performing the procedure, when your results will be ready. This information is not intended to replace advice given to you by your health care provider. Make sure you discuss any questions you have with your health care provider. Document Released: 01/14/2011 Document Revised: 10/12/2016 Document Reviewed: 02/23/2016 Elsevier Interactive Patient Education  2017 [employer]  Colonoscopy, Adult, Care After This sheet gives you information about how to care for yourself after your procedure. Your health care provider may also give you more specific instructions. If you have problems or questions, contact your health care provider. What can I expect after the procedure? After the procedure, it is common to have:  A small amount of blood in your stool for 24 hours after the procedure.  Some gas.  Mild abdominal cramping or bloating.  Follow these instructions at home: General instructions   For the first 24 hours after the procedure: ? Do not drive or use machinery. ? Do not sign important documents. ? Do not drink alcohol. ? Do your regular daily activities at a slower pace than normal. ? Eat soft, easy-to-digest foods. ? Rest often.  Take over-the-counter or  prescription medicines only as told by your health care provider.  It is up to you to get the results of your procedure. Ask your health care provider, or the department performing the procedure, when your results will be ready. Relieving cramping and bloating  Try walking around when you have cramps or feel bloated.  Apply heat to your abdomen as told by your health care provider. Use a heat source that your health care provider recommends, such as a moist heat pack or a heating pad. ? Place a towel between your skin and the heat source. ? Leave the heat on for 20-30 minutes. ? Remove the heat if your skin turns bright red. This is especially important if you are unable to feel pain, heat, or cold. You may have a greater risk of getting burned. Eating and drinking  Drink enough fluid to keep your urine clear or pale yellow.  Resume your normal diet as instructed by your health care provider. Avoid heavy or fried foods that are hard to digest.  Avoid drinking alcohol for as long as instructed by your health care provider. Contact a health care provider if:  You have blood in your stool 2-3 days after the procedure. Get help right away if:  You have more than a small spotting of blood in your stool.  You pass large blood clots in your stool.  Your abdomen is swollen.  You have nausea or vomiting.  You have a fever.  You have increasing abdominal pain that is not relieved with medicine. This information is not intended to replace advice given to you  by your health care provider. Make sure you discuss any questions you have with your health care provider. Document Released: 07/26/2004 Document Revised: 09/05/2016 Document Reviewed: 02/23/2016 Elsevier Interactive Patient Education  2018 [place] Anesthesia is a term that refers to techniques, procedures, and medicines that help a person stay safe and comfortable during a medical procedure.  Monitored anesthesia care, or sedation, is one type of anesthesia. Your anesthesia specialist may recommend sedation if you will be having a procedure that does not require you to be unconscious, such as:  Cataract surgery.  A dental procedure.  A biopsy.  A colonoscopy.  During the procedure, you may receive a medicine to help you relax (sedative). There are three levels of sedation:  Mild sedation. At this level, you may feel awake and relaxed. You will be able to follow directions.  Moderate sedation. At this level, you will be sleepy. You may not remember the procedure.  Deep sedation. At this level, you will be asleep. You will not remember the procedure.  The more medicine you are given, the deeper your level of sedation will be. Depending on how you respond to the procedure, the anesthesia specialist may change your level of sedation or the type of anesthesia to fit your needs. An anesthesia specialist will monitor you closely during the procedure. Let your health care provider know about:  Any allergies you have.  All medicines you are taking, including vitamins, herbs, eye drops, creams, and over-the-counter medicines.  Any use of steroids (by mouth or as a cream).  Any problems you or family members have had with sedatives and anesthetic medicines.  Any blood disorders you have.  Any surgeries you have had.  Any medical conditions you have, such as sleep apnea.  Whether you are pregnant or may be pregnant.  Any use of cigarettes, alcohol, or street drugs. What are the risks? Generally, this is a safe procedure. However, problems may occur, including:  Getting too much medicine (oversedation).  Nausea.  Allergic reaction to medicines.  Trouble breathing. If this happens, a breathing tube may be used to help with breathing. It will be removed when you are awake and breathing on your own.  Heart trouble.  Lung trouble.  Before the procedure Staying  hydrated Follow instructions from your health care provider about hydration, which may include:  Up to 2 hours before the procedure - you may continue to drink clear liquids, such as water, clear fruit juice, black coffee, and plain tea.  Eating and drinking restrictions Follow instructions from your health care provider about eating and drinking, which may include:  8 hours before the procedure - stop eating heavy meals or foods such as meat, fried foods, or fatty foods.  6 hours before the procedure - stop eating light meals or foods, such as toast or cereal.  6 hours before the procedure - stop drinking milk or drinks that contain milk.  2 hours before the procedure - stop drinking clear liquids.  Medicines Ask your health care provider about:  Changing or stopping your regular medicines. This is especially important if you are taking diabetes medicines or blood thinners.  Taking medicines such as aspirin and ibuprofen. These medicines can thin your blood. Do not take these medicines before your procedure if your health care provider instructs you not to.  Tests and exams  You will have a physical exam.  You may have blood tests done to show: ? How well your kidneys  and liver are working. ? How well your blood can clot.  General instructions  Plan to have someone take you home from the hospital or clinic.  If you will be going home right after the procedure, plan to have someone with you for 24 hours.  What happens during the procedure?  Your blood pressure, heart rate, breathing, level of pain and overall condition will be monitored.  An IV tube will be inserted into one of your veins.  Your anesthesia specialist will give you medicines as needed to keep you comfortable during the procedure. This may mean changing the level of sedation.  The procedure will be performed. After the procedure  Your blood pressure, heart rate, breathing rate, and blood oxygen level  will be monitored until the medicines you were given have worn off.  Do not drive for 24 hours if you received a sedative.  You may: ? Feel sleepy, clumsy, or nauseous. ? Feel forgetful about what happened after the procedure. ? Have a sore throat if you had a breathing tube during the procedure. ? Vomit. This information is not intended to replace advice given to you by your health care provider. Make sure you discuss any questions you have with your health care provider. Document Released: 09/07/2005 Document Revised: 05/20/2016 Document Reviewed: 04/03/2016 Elsevier Interactive Patient Education  2018 [place], Care After These instructions provide you with information about caring for yourself after your procedure. Your health care provider may also give you more specific instructions. Your treatment has been planned according to current medical practices, but problems sometimes occur. Call your health care provider if you have any problems or questions after your procedure. What can I expect after the procedure? After your procedure, it is common to:  Feel sleepy for several hours.  Feel clumsy and have poor balance for several hours.  Feel forgetful about what happened after the procedure.  Have poor judgment for several hours.  Feel nauseous or vomit.  Have a sore throat if you had a breathing tube during the procedure.  Follow these instructions at home: For at least 24 hours after the procedure:   Do not: ? Participate in activities in which you could fall or become injured. ? Drive. ? Use heavy machinery. ? Drink alcohol. ? Take sleeping pills or medicines that cause drowsiness. ? Make important decisions or sign legal documents. ? Take care of children on your own.  Rest. Eating and drinking  Follow the diet that is recommended by your health care provider.  If you vomit, drink water, juice, or soup when you can drink without  vomiting.  Make sure you have little or no nausea before eating solid foods. General instructions  Have a responsible adult stay with you until you are awake and alert.  Take over-the-counter and prescription medicines only as told by your health care provider.  If you smoke, do not smoke without supervision.  Keep all follow-up visits as told by your health care provider. This is important. Contact a health care provider if:  You keep feeling nauseous or you keep vomiting.  You feel light-headed.  You develop a rash.  You have a fever. Get help right away if:  You have trouble breathing. This information is not intended to replace advice given to you by your health care provider. Make sure you discuss any questions you have with your health care provider. Document Released: 04/03/2016 Document Revised: 08/03/2016 Document Reviewed: 04/03/2016 Elsevier Interactive Patient Education  2018 [employer]  

## 2018-08-23 ENCOUNTER — Other Ambulatory Visit: Payer: Self-pay

## 2018-08-23 ENCOUNTER — Encounter (HOSPITAL_COMMUNITY)
Admission: RE | Admit: 2018-08-23 | Discharge: 2018-08-23 | Disposition: A | Discharge status: Home / Self Care | Payer: PPO | Source: Ambulatory Visit | Attending: Internal Medicine | Admitting: Internal Medicine

## 2018-08-23 ENCOUNTER — Encounter (HOSPITAL_COMMUNITY): Payer: Self-pay

## 2018-08-23 DIAGNOSIS — Z01812 Encounter for preprocedural laboratory examination: Secondary | ICD-10-CM | POA: Diagnosis not present

## 2018-08-23 DIAGNOSIS — Z01810 Encounter for preprocedural cardiovascular examination: Secondary | ICD-10-CM | POA: Insufficient documentation

## 2018-08-23 DIAGNOSIS — I4510 Unspecified right bundle-branch block: Secondary | ICD-10-CM | POA: Insufficient documentation

## 2018-08-23 HISTORY — DX: Other intervertebral disc degeneration, lumbar region without mention of lumbar back pain or lower extremity pain: M51.369

## 2018-08-23 HISTORY — DX: Gastro-esophageal reflux disease without esophagitis: K21.9

## 2018-08-23 HISTORY — DX: Other intervertebral disc degeneration, lumbar region: M51.36

## 2018-08-23 LAB — CBC WITH DIFFERENTIAL/PLATELET
Basophils Absolute: 0.1 10*3/uL (ref 0.0–0.1)
Basophils Relative: 1 %
EOS ABS: 0.4 10*3/uL (ref 0.0–0.7)
EOS PCT: 6 %
HCT: 45 % (ref 39.0–52.0)
Hemoglobin: 14.6 g/dL (ref 13.0–17.0)
LYMPHS ABS: 0.7 10*3/uL (ref 0.7–4.0)
LYMPHS PCT: 8 %
MCH: 28.5 pg (ref 26.0–34.0)
MCHC: 32.4 g/dL (ref 30.0–36.0)
MCV: 87.9 fL (ref 78.0–100.0)
MONO ABS: 0.6 10*3/uL (ref 0.1–1.0)
MONOS PCT: 8 %
Neutro Abs: 6.2 10*3/uL (ref 1.7–7.7)
Neutrophils Relative %: 77 %
PLATELETS: 220 10*3/uL (ref 150–400)
RBC: 5.12 MIL/uL (ref 4.22–5.81)
RDW: 14.3 % (ref 11.5–15.5)
WBC: 8 10*3/uL (ref 4.0–10.5)

## 2018-08-23 LAB — BASIC METABOLIC PANEL
Anion gap: 10 (ref 5–15)
BUN: 12 mg/dL (ref 6–20)
CO2: 26 mmol/L (ref 22–32)
CREATININE: 1.3 mg/dL — AB (ref 0.61–1.24)
Calcium: 9.2 mg/dL (ref 8.9–10.3)
Chloride: 103 mmol/L (ref 98–111)
GFR calc Af Amer: >60 mL/min (ref >60)
GFR, EST NON AFRICAN AMERICAN: 59 mL/min — AB (ref >60)
GLUCOSE: 99 mg/dL (ref 70–99)
POTASSIUM: 3.9 mmol/L (ref 3.5–5.1)
Sodium: 139 mmol/L (ref 135–145)

## 2018-08-30 ENCOUNTER — Ambulatory Visit (HOSPITAL_COMMUNITY)
Admission: RE | Admit: 2018-08-30 | Discharge: 2018-08-30 | Disposition: A | Discharge status: Home / Self Care | Payer: PPO | Source: Ambulatory Visit | Attending: Internal Medicine | Admitting: Internal Medicine

## 2018-08-30 ENCOUNTER — Encounter (HOSPITAL_COMMUNITY)
Admission: RE | Disposition: A | Discharge status: Home / Self Care | Payer: Self-pay | Source: Ambulatory Visit | Attending: Internal Medicine

## 2018-08-30 ENCOUNTER — Encounter (HOSPITAL_COMMUNITY): Payer: Self-pay | Admitting: *Deleted

## 2018-08-30 ENCOUNTER — Ambulatory Visit (HOSPITAL_COMMUNITY): Payer: PPO | Admitting: Anesthesiology

## 2018-08-30 DIAGNOSIS — D123 Benign neoplasm of transverse colon: Secondary | ICD-10-CM

## 2018-08-30 DIAGNOSIS — K228 Other specified diseases of esophagus: Secondary | ICD-10-CM | POA: Insufficient documentation

## 2018-08-30 DIAGNOSIS — R1314 Dysphagia, pharyngoesophageal phase: Secondary | ICD-10-CM | POA: Diagnosis not present

## 2018-08-30 DIAGNOSIS — Z1211 Encounter for screening for malignant neoplasm of colon: Secondary | ICD-10-CM | POA: Diagnosis not present

## 2018-08-30 DIAGNOSIS — Z86010 Personal history of colonic polyps: Secondary | ICD-10-CM | POA: Diagnosis not present

## 2018-08-30 DIAGNOSIS — K5730 Diverticulosis of large intestine without perforation or abscess without bleeding: Secondary | ICD-10-CM | POA: Diagnosis not present

## 2018-08-30 DIAGNOSIS — K219 Gastro-esophageal reflux disease without esophagitis: Secondary | ICD-10-CM | POA: Diagnosis not present

## 2018-08-30 DIAGNOSIS — E785 Hyperlipidemia, unspecified: Secondary | ICD-10-CM | POA: Insufficient documentation

## 2018-08-30 DIAGNOSIS — F419 Anxiety disorder, unspecified: Secondary | ICD-10-CM | POA: Diagnosis not present

## 2018-08-30 DIAGNOSIS — M5136 Other intervertebral disc degeneration, lumbar region: Secondary | ICD-10-CM | POA: Diagnosis not present

## 2018-08-30 DIAGNOSIS — Z9049 Acquired absence of other specified parts of digestive tract: Secondary | ICD-10-CM | POA: Insufficient documentation

## 2018-08-30 DIAGNOSIS — Z881 Allergy status to other antibiotic agents status: Secondary | ICD-10-CM | POA: Insufficient documentation

## 2018-08-30 DIAGNOSIS — Z79899 Other long term (current) drug therapy: Secondary | ICD-10-CM | POA: Insufficient documentation

## 2018-08-30 DIAGNOSIS — G8929 Other chronic pain: Secondary | ICD-10-CM | POA: Insufficient documentation

## 2018-08-30 DIAGNOSIS — Q394 Esophageal web: Secondary | ICD-10-CM | POA: Diagnosis not present

## 2018-08-30 DIAGNOSIS — Z8619 Personal history of other infectious and parasitic diseases: Secondary | ICD-10-CM | POA: Diagnosis not present

## 2018-08-30 DIAGNOSIS — R1310 Dysphagia, unspecified: Secondary | ICD-10-CM | POA: Diagnosis not present

## 2018-08-30 DIAGNOSIS — Z8249 Family history of ischemic heart disease and other diseases of the circulatory system: Secondary | ICD-10-CM | POA: Insufficient documentation

## 2018-08-30 DIAGNOSIS — D124 Benign neoplasm of descending colon: Secondary | ICD-10-CM | POA: Insufficient documentation

## 2018-08-30 DIAGNOSIS — Z85810 Personal history of malignant neoplasm of tongue: Secondary | ICD-10-CM | POA: Diagnosis not present

## 2018-08-30 DIAGNOSIS — K2270 Barrett's esophagus without dysplasia: Secondary | ICD-10-CM | POA: Insufficient documentation

## 2018-08-30 DIAGNOSIS — Z923 Personal history of irradiation: Secondary | ICD-10-CM | POA: Diagnosis not present

## 2018-08-30 HISTORY — PX: BIOPSY: SHX5522

## 2018-08-30 HISTORY — PX: ESOPHAGOGASTRODUODENOSCOPY (EGD) WITH PROPOFOL: SHX5813

## 2018-08-30 HISTORY — PX: MALONEY DILATION: SHX5535

## 2018-08-30 HISTORY — PX: COLONOSCOPY WITH PROPOFOL: SHX5780

## 2018-08-30 HISTORY — PX: POLYPECTOMY: SHX5525

## 2018-08-30 SURGERY — COLONOSCOPY WITH PROPOFOL
Anesthesia: Monitor Anesthesia Care

## 2018-08-30 MED ORDER — HYDROMORPHONE HCL 1 MG/ML IJ SOLN: 0.2500 mg | INTRAMUSCULAR | Status: DC | PRN

## 2018-08-30 MED ORDER — HYDROCODONE-ACETAMINOPHEN 7.5-325 MG PO TABS: 1.0000 | ORAL_TABLET | Freq: Once | ORAL | Status: DC | PRN

## 2018-08-30 MED ORDER — CHLORHEXIDINE GLUCONATE CLOTH 2 % EX PADS: 6.0000 | MEDICATED_PAD | Freq: Once | CUTANEOUS | Status: DC

## 2018-08-30 MED ORDER — LACTATED RINGERS IV SOLN: INTRAVENOUS | Status: DC

## 2018-08-30 MED ORDER — PROMETHAZINE HCL 25 MG/ML IJ SOLN: 6.2500 mg | INTRAMUSCULAR | Status: DC | PRN

## 2018-08-30 MED ORDER — PROPOFOL 10 MG/ML IV BOLUS
INTRAVENOUS | Status: DC | PRN
  Administered 2018-08-30 (×3): 20 mg via INTRAVENOUS

## 2018-08-30 MED ORDER — MEPERIDINE HCL 100 MG/ML IJ SOLN: 6.2500 mg | INTRAMUSCULAR | Status: DC | PRN

## 2018-08-30 MED ORDER — PROPOFOL 10 MG/ML IV BOLUS
INTRAVENOUS | Status: AC
  Filled 2018-08-30: qty 20

## 2018-08-30 MED ORDER — LIDOCAINE HCL (PF) 1 % IJ SOLN
INTRAMUSCULAR | Status: AC
  Filled 2018-08-30: qty 5

## 2018-08-30 MED ORDER — LACTATED RINGERS IV SOLN
INTRAVENOUS | Status: DC
  Administered 2018-08-30: 1000 mL via INTRAVENOUS

## 2018-08-30 MED ORDER — MIDAZOLAM HCL 5 MG/5ML IJ SOLN
INTRAMUSCULAR | Status: DC | PRN
  Administered 2018-08-30: 2 mg via INTRAVENOUS

## 2018-08-30 MED ORDER — STERILE WATER FOR IRRIGATION IR SOLN
Status: DC | PRN
  Administered 2018-08-30: 200 mL

## 2018-08-30 MED ORDER — PROPOFOL 10 MG/ML IV BOLUS
INTRAVENOUS | Status: AC
  Filled 2018-08-30: qty 40

## 2018-08-30 MED ORDER — MIDAZOLAM HCL 2 MG/2ML IJ SOLN
INTRAMUSCULAR | Status: AC
  Filled 2018-08-30: qty 2

## 2018-08-30 MED ORDER — PROPOFOL 500 MG/50ML IV EMUL
INTRAVENOUS | Status: DC | PRN
  Administered 2018-08-30: 175 ug/kg/min via INTRAVENOUS
  Administered 2018-08-30 (×2): via INTRAVENOUS

## 2018-08-30 NOTE — Anesthesia Postprocedure Evaluation (Signed)
Anesthesia Post Note  Patient: [name] Best  Procedure(s) Performed: COLONOSCOPY WITH PROPOFOL (N/A ) ESOPHAGOGASTRODUODENOSCOPY (EGD) WITH PROPOFOL (N/A ) MALONEY DILATION (N/A ) BIOPSY POLYPECTOMY  Patient location during evaluation: PACU Anesthesia Type: MAC Level of consciousness: awake and alert and oriented Pain management: pain level controlled Vital Signs Assessment: post-procedure vital signs reviewed and stable Respiratory status: spontaneous breathing Cardiovascular status: blood pressure returned to baseline and stable Postop Assessment: no apparent nausea or vomiting Anesthetic complications: no     Last Vitals:  Vitals:   08/30/18 0730 08/30/18 0735  BP:  (!) 142/96  Pulse:    Resp: 20 12  Temp:    SpO2: 99% 99%    Last Pain:  Vitals:   08/30/18 0826  TempSrc:   PainSc: 0-No pain                 Conley Pawling

## 2018-08-30 NOTE — Addendum Note (Signed)
Addendum  created 08/30/18 0930 by Ollen Bowl, CRNA   Intraprocedure Flowsheets edited, Intraprocedure Meds edited

## 2018-08-30 NOTE — Op Note (Signed)
[facility] Patient Name: Arnez Stoneking Procedure Date: 08/30/2018 7:15 AM MRN: [id number] Date of Birth: 1961-04-26 Attending MD: Norvel Richards , MD CSN: [id number] Age: 57 Admit Type: Outpatient Procedure:                Upper GI endoscopy Indications:              Dysphagia Providers:                Norvel Richards, MD, Rosina Lowenstein, RN, Randa Spike, Technician Referring MD:              Medicines:                Propofol per Anesthesia Complications:            No immediate complications. Estimated Blood Loss:     Estimated blood loss was minimal. Procedure:                Pre-Anesthesia Assessment:                           - Prior to the procedure, a History and Physical                            was performed, and patient medications and                            allergies were reviewed. The patient's tolerance of                            previous anesthesia was also reviewed. The risks                            and benefits of the procedure and the sedation                            options and risks were discussed with the patient.                            All questions were answered, and informed consent                            was obtained. Prior Anticoagulants: The patient has                            taken no previous anticoagulant or antiplatelet                            agents. ASA Grade Assessment: II - A patient with                            mild systemic disease. After reviewing the risks  and benefits, the patient was deemed in                            satisfactory condition to undergo the procedure.                           After obtaining informed consent, the endoscope was                            passed under direct vision. Throughout the                            procedure, the patient's blood pressure, pulse, and                            oxygen saturations were  monitored continuously. The                            [id number]-[id number] ([id number]) scope was introduced through the                            and advanced to the second part of duodenum. The                            upper GI endoscopy was accomplished without                            difficulty. The patient tolerated the procedure                            well. Scope In: 7:44:31 AM Scope Out: 7:51:38 AM Total Procedure Duration: 0 hours 7 minutes 7 seconds  Findings:      A web was found at the cricopharyngeus. partially dilated with scope       passage. Circumferential salmon colored epithelium with 3 tongues coming       up 4 cm above the junction. No esophagitis. No nodularity.Tubular       esophagus otherwise patent throughout its course. The scope was       withdrawn. Dilation was performed with a Maloney dilator with mild       resistance at 24 Fr. The dilation site was examined and showed no       change. Estimated blood loss was minimal.      The entire examined stomach was normal.      The duodenal bulb and second portion of the duodenum were normal.      This was biopsied with a cold forceps for histology. Estimated blood       loss was minimal. 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 Dilated with scope                            and Maloney Dilated. Abnormal distal esophagus  consistent with Barrett's esophagus status post                            biopsy                           - Normal stomach.                           - Normal duodenal bulb and second portion of the                            duodenum.                           - Salmon-colored esophageal mucosa. Biopsied. Moderate Sedation:      Moderate (conscious) sedation was administered by the endoscopy nurse       and supervised by the endoscopist. The following parameters were       monitored: oxygen saturation, heart rate, blood pressure, respiratory       rate, EKG,  adequacy of pulmonary ventilation, and response to care.       Total physician intraservice time was 13 minutes. Recommendation:           - Patient has a contact number available for                            emergencies. The signs and symptoms of potential                            delayed complications were discussed with the                            patient. Return to normal activities tomorrow.                            Written discharge instructions were provided to the                            patient.                           - Advance diet as tolerated.                           - Continue present medications.                           - Repeat upper endoscopy after studies are complete                            for surveillance based on pathology results.                           - Return to GI clinic (date not yet determined).  See colonoscopy report Procedure Code(s):        --- Professional ---                           [phone], Esophagogastroduodenoscopy, flexible,                            transoral; with biopsy, single or multiple                           43450, Dilation of esophagus, by unguided sound or                            bougie, single or multiple passes                           G0500, Moderate sedation services provided by the                            same physician or other qualified health care                            professional performing a gastrointestinal                            endoscopic service that sedation supports,                            requiring the presence of an independent trained                            observer to assist in the monitoring of the                            patient's level of consciousness and physiological                            status; initial 15 minutes of intra-service time;                            patient age 26 years or older (additional time may                             be reported with [phone], as appropriate) Diagnosis Code(s):        --- Professional ---                           Q39.4, Esophageal web                           K22.8, Other specified diseases of esophagus                           R13.10, Dysphagia, unspecified CPT copyright 2017 American Medical Association. All rights reserved. The codes documented in this report are preliminary and upon  coder review may  be revised to meet current compliance requirements. Cristopher Estimable. Lynnleigh Soden, MD Norvel Richards, MD 08/30/2018 8:36:12 AM This report has been signed electronically. Number of Addenda: 0

## 2018-08-30 NOTE — Anesthesia Preprocedure Evaluation (Signed)
Anesthesia Evaluation  Patient identified by MRN, date of birth, ID band Patient awake    Reviewed: Allergy & Precautions, H&P , NPO status , Patient's Chart, lab work & pertinent test results, reviewed documented beta blocker date and time   History of Anesthesia Complications (+) PONV and history of anesthetic complications  Airway Mallampati: III  TM Distance: >3 FB Neck ROM: full    Dental no notable dental hx. (+) Missing, Teeth Intact, Dental Advidsory Given   Pulmonary neg pulmonary ROS,    Pulmonary exam normal breath sounds clear to auscultation       Cardiovascular Exercise Tolerance: Good negative cardio ROS   Rhythm:regular Rate:Normal     Neuro/Psych Anxiety negative neurological ROS  negative psych ROS   GI/Hepatic negative GI ROS, Neg liver ROS,   Endo/Other  negative endocrine ROS  Renal/GU negative Renal ROS  negative genitourinary   Musculoskeletal   Abdominal   Peds  Hematology negative hematology ROS (+)   Anesthesia Other Findings Stage IV  squamos cell CA throat/tongue, s/p radiation Chronic back pain Diverticulitis Removal of G-tube, dysphagia Incomplete Right BBB at 65, evidence septal infarct  Reproductive/Obstetrics negative OB ROS                             Anesthesia Physical Anesthesia Plan  ASA: III  Anesthesia Plan: MAC   Post-op Pain Management:    Induction:   PONV Risk Score and Plan:   Airway Management Planned:   Additional Equipment:   Intra-op Plan:   Post-operative Plan:   Informed Consent: I have reviewed the patients History and Physical, chart, labs and discussed the procedure including the risks, benefits and alternatives for the proposed anesthesia with the patient or authorized representative who has indicated his/her understanding and acceptance.   Dental Advisory Given  Plan Discussed with: CRNA  Anesthesia Plan  Comments:         Anesthesia Quick Evaluation

## 2018-08-30 NOTE — Op Note (Signed)
[facility] Patient Name: [name] Best Procedure Date: 08/30/2018 7:54 AM MRN: [id number] Date of Birth: 1961/03/14 Attending MD: Norvel Richards , MD CSN: [id number] Age: 57 Admit Type: Outpatient Procedure:                Colonoscopy Indications:              High risk colon cancer surveillance: Personal                            history of colonic polyps Providers:                Norvel Richards, MD, Rosina Lowenstein, RN, Randa Spike, Technician Referring MD:             Halford Chessman MD, MD Medicines:                Propofol per Anesthesia Complications:            No immediate complications. Estimated Blood Loss:     Estimated blood loss was minimal. Procedure:                Pre-Anesthesia Assessment:                           - Prior to the procedure, a History and Physical                            was performed, and patient medications and                            allergies were reviewed. The patient's tolerance of                            previous anesthesia was also reviewed. The risks                            and benefits of the procedure and the sedation                            options and risks were discussed with the patient.                            All questions were answered, and informed consent                            was obtained. Prior Anticoagulants: The patient has                            taken no previous anticoagulant or antiplatelet                            agents. ASA Grade Assessment: II - A patient with  mild systemic disease. After reviewing the risks                            and benefits, the patient was deemed in                            satisfactory condition to undergo the procedure.                           After obtaining informed consent, the colonoscope                            was passed under direct vision. Throughout the   procedure, the patient's blood pressure, pulse, and                            oxygen saturations were monitored continuously. The                            [id number]-[id number] ([id number]) scope was introduced through                            the and advanced to the the cecum, identified by                            appendiceal orifice and ileocecal valve. The                            colonoscopy was performed without difficulty. The                            patient tolerated the procedure well. The quality                            of the bowel preparation was adequate. The                            ileocecal valve, appendiceal orifice, and rectum                            were photographed. The quality of the bowel                            preparation was adequate. Scope In: 7:59:38 AM Scope Out: 8:22:17 AM Scope Withdrawal Time: 0 hours 14 minutes 43 seconds  Total Procedure Duration: 0 hours 22 minutes 39 seconds  Findings:      The perianal and digital rectal examinations were normal.      Multiple small and large-mouthed diverticula were found in the sigmoid       colon and descending colon.      Four semi-pedunculated polyps were found in the descending colon and       hepatic flexure. The polyps were 4 to 6 mm in size. These polyps were       removed with a cold snare. Resection and retrieval were complete.  Estimated blood loss was minimal.      A 8 mm polyp was found in the hepatic flexure. The polyp was       semi-pedunculated. The polyp was removed with a hot snare. Resection and       retrieval were complete. Estimated blood loss: none.      The exam was otherwise without abnormality on direct and retroflexion       views. Impression:               - Diverticulosis in the sigmoid colon and in the                            descending colon.                           - Four 4 to 6 mm polyps in the descending colon and                            at the hepatic flexure,  removed with a cold snare.                            Resected and retrieved.                           - One 8 mm polyp at the hepatic flexure, removed                            with a hot snare. Resected and retrieved.                           - The examination was otherwise normal on direct                            and retroflexion views. Moderate Sedation:      Moderate (conscious) sedation was personally administered by an       anesthesia professional. The following parameters were monitored: oxygen       saturation, heart rate, blood pressure, respiratory rate, EKG, adequacy       of pulmonary ventilation, and response to care. Total physician       intraservice time was 43 minutes. Recommendation:           - Patient has a contact number available for                            emergencies. The signs and symptoms of potential                            delayed complications were discussed with the                            patient. Return to normal activities tomorrow.                            Written discharge instructions were provided to the  patient.                           - Advance diet as tolerated.                           - Continue present medications.                           - Repeat colonoscopy date to be determined after                            pending pathology results are reviewed for                            surveillance based on pathology results.                           - Return to GI office (date not yet determined). Procedure Code(s):        --- Professional ---                           [phone], Colonoscopy, flexible; with removal of                            tumor(s), polyp(s), or other lesion(s) by snare                            technique Diagnosis Code(s):        --- Professional ---                           Z86.010, Personal history of colonic polyps                           D12.4, Benign neoplasm of  descending colon                           D12.3, Benign neoplasm of transverse colon (hepatic                            flexure or splenic flexure)                           K57.30, Diverticulosis of large intestine without                            perforation or abscess without bleeding CPT copyright 2017 American Medical Association. All rights reserved. The codes documented in this report are preliminary and upon coder review may  be revised to meet current compliance requirements. Cristopher Estimable. , MD Norvel Richards, MD 08/30/2018 8:44:42 AM This report has been signed electronically. Number of Addenda: 0

## 2018-08-30 NOTE — Transfer of Care (Signed)
Immediate Anesthesia Transfer of Care Note  Patient: [name] Best  Procedure(s) Performed: COLONOSCOPY WITH PROPOFOL (N/A ) ESOPHAGOGASTRODUODENOSCOPY (EGD) WITH PROPOFOL (N/A ) MALONEY DILATION (N/A ) BIOPSY POLYPECTOMY  Patient Location: PACU  Anesthesia Type:MAC  Level of Consciousness: awake and alert   Airway & Oxygen Therapy: Patient Spontanous Breathing  Post-op Assessment: Report given to RN and Post -op Vital signs reviewed and stable  Post vital signs: Reviewed and stable  Last Vitals:  Vitals Value Taken Time  BP    Temp    Pulse 64 08/30/2018  8:31 AM  Resp    SpO2 99 % 08/30/2018  8:31 AM  Vitals shown include unvalidated device data.  Last Pain:  Vitals:   08/30/18 0826  TempSrc:   PainSc: 0-No pain      Patients Stated Pain Goal: 9 (55/20/80 2233)  Complications: No apparent anesthesia complications

## 2018-08-30 NOTE — H&P (Addendum)
@LOGO @   Primary Care Physician:  Sharilyn Sites, MD Primary Gastroenterologist:  Dr. Gala Romney  Pre-Procedure History & Physical: HPI:  [name] Best is a 57 y.o. male here for for surveillance colonoscopy-history of colonic polyps. Also, EGD to further evaluate esophageal dysphagia.  Past Medical History:  Diagnosis Date  . Anxiety   . C. difficile diarrhea 2014   treated with Flagyl  . Cancer (HCC)    Stage IV squamous cell cancer of throat  . Chronic back pain   . Degenerative disc disease, lumbar   . Diverticulitis    hospitalized in 2003  . GERD (gastroesophageal reflux disease)   . Hx of radiation therapy 01/25/16- 03/15/16   Base of Tongue, bilateral neck  . Hyperlipidemia   . PONV (postoperative nausea and vomiting)     Past Surgical History:  Procedure Laterality Date  . CHOLECYSTECTOMY    . COLONOSCOPY N/A 07/02/2013   Jenkins:Sessile polyp (tubular adenoma) ranging between 3-40mm in size in the proximal sigmoid colon/mild diverticulosis   . COLONOSCOPY     Dr. Laural Golden: prior to 2003 per patient  . COLONOSCOPY WITH PROPOFOL N/A 08/07/2017   Dr. Gala Romney: multiple 4-13 mm polyps in rectum, sigmoid, descending, transverse, and cecum. adenomas. diverticulosis in sigmoid and descending  . DIRECT LARYNGOSCOPY N/A 12/10/2015   Procedure: DIRECT LARYNGOSCOPY;  Surgeon: Leta Baptist, MD;  Location: [place];  Service: ENT;  Laterality: N/A;  . ESOPHAGOGASTRODUODENOSCOPY N/A 07/02/2013   Jenkins:4 cm hiatal hernia/esophagus appeared normal/chronic gastritis. Clotest negative.  Marland Kitchen GASTROSTOMY TUBE CHANGE     placement and removal during chemo/radiation. no longer present  . KNEE ARTHROSCOPY WITH MEDIAL MENISECTOMY Left 04/14/2015   Procedure: KNEE ARTHROSCOPY WITH MEDIAL MENISECTOMY;  Surgeon: Sanjuana Kava, MD;  Location: [facility];  Service: Orthopedics;  Laterality: Left;  . left elbow    . MASS BIOPSY Right 12/10/2015   Procedure: RIGHT EXCISIONAL NECK MASS BIOPSY;   Surgeon: Leta Baptist, MD;  Location: [facility];  Service: ENT;  Laterality: Right;  . POLYPECTOMY  08/07/2017   Procedure: POLYPECTOMY;  Surgeon: Daneil Dolin, MD;  Location: [facility];  Service: Endoscopy;;  colon  . REMOVAL OF GASTROSTOMY TUBE    . TONGUE BIOPSY Right 12/10/2015   Procedure: TONGUE BIOPSY;  Surgeon: Leta Baptist, MD;  Location: [place];  Service: ENT;  Laterality: Right;    Prior to Admission medications   Medication Sig Start Date End Date Taking? Authorizing Provider  diazepam (VALIUM) 10 MG tablet Take 1 tablet (10 mg total) by mouth at bedtime as needed for anxiety or sleep. Reported on 02/16/2016 Patient taking differently: Take 10 mg by mouth at bedtime as needed for sleep. Reported on 02/16/2016 04/18/16  Yes Holley Bouche, NP  HYDROcodone-acetaminophen (NORCO) 7.5-325 MG tablet Take 1 tablet by mouth 2 (two) times daily as needed for moderate pain (shoulder/disc pain).    Yes [provider]  pantoprazole (PROTONIX) 40 MG tablet Take 40 mg by mouth daily.   Yes [provider]  pravastatin (PRAVACHOL) 40 MG tablet Take 40 mg by mouth daily.   Yes [provider]  PROCTO-MED HC 2.5 % rectal cream PLACE 1 APPLICATION RECTALLY TWICE DAILY. Patient taking differently: Place 1 application rectally 2 (two) times daily as needed (discomfort/hemmorroids).  12/16/17  Yes Mahala Menghini, PA-C  sodium fluoride (FLUORISHIELD) 1.1 % GEL dental gel Instill one drop of gel per tooth space of tray. Place over teeth for 5  minutes. Remove. Spit out excess. Repeat nightly. Patient taking differently: Place 1 application onto teeth at bedtime. Instill one drop of gel per tooth space of tray. Place over teeth for 5 minutes. Remove. Spit out excess. Repeat nightly. 02/08/16  Yes Eppie Gibson, MD    Allergies as of 06/15/2018 - Review Complete 06/15/2018  Allergen Reaction Noted  . Tetracyclines & related Hives 08/16/2011     Family History  Problem Relation Age of Onset  . Heart failure Mother 57  . Heart attack Father 82  . Heart disease Unknown   . Cancer Paternal Uncle        died of cancer  . Colon cancer Neg Hx   . Colon polyps Neg Hx     Social History   Socioeconomic History  . Marital status: Married    Spouse name: Not on file  . Number of children: 2  . Years of education: 12th grade  . Highest education level: Not on file  Occupational History  . Occupation: retired  Scientific laboratory technician  . Financial resource strain: Not on file  . Food insecurity:    Worry: Not on file    Inability: Not on file  . Transportation needs:    Medical: Not on file    Non-medical: Not on file  Tobacco Use  . Smoking status: Never Smoker  . Smokeless tobacco: Never Used  Substance and Sexual Activity  . Alcohol use: No  . Drug use: Yes    Types: Marijuana    Comment: "sometimes for appetite"  . Sexual activity: Yes    Birth control/protection: None    Comment: married, retired, 1 son & 1 daughter  Lifestyle  . Physical activity:    Days per week: Not on file    Minutes per session: Not on file  . Stress: Not on file  Relationships  . Social connections:    Talks on phone: Not on file    Gets together: Not on file    Attends religious service: Not on file    Active member of club or organization: Not on file    Attends meetings of clubs or organizations: Not on file    Relationship status: Not on file  . Intimate partner violence:    Fear of current or ex partner: Not on file    Emotionally abused: Not on file    Physically abused: Not on file    Forced sexual activity: Not on file  Other Topics Concern  . Not on file  Social History Narrative   Lives with mother.      Review of Systems: See HPI, otherwise negative ROS  Physical Exam: BP (!) 135/94   Pulse 67   Temp 98.1 F (36.7 C) (Oral)   Resp (!) 23   SpO2 99%  General:   Alert,  Well-developed, well-nourished, pleasant and  cooperative in NAD Neck:  Supple; no masses or thyromegaly. No significant cervical adenopathy. Lungs:  Clear throughout to auscultation.   No wheezes, crackles, or rhonchi. No acute distress. Heart:  Regular rate and rhythm; no murmurs, clicks, rubs,  or gallops. Abdomen: Non-distended, normal bowel sounds.  Soft and nontender without appreciable mass or hepatosplenomegaly.  Pulses:  Normal pulses noted. Extremities:  Without clubbing or edema.  Impression/Plan:  57  -year-old gentleman with history of colonic polyps here for surveillance colonoscopy and EGD to further evaluate dysphagia.  The risks, benefits, limitations, imponderables and alternatives regarding both EGD and colonoscopy have been reviewed with  the patient. Questions have been answered. All parties agreeable.      Notice: This dictation was prepared with Dragon dictation along with smaller phrase technology. Any transcriptional errors that result from this process are unintentional and may not be corrected upon review.

## 2018-09-02 ENCOUNTER — Encounter: Payer: Self-pay | Admitting: Internal Medicine

## 2018-09-03 DIAGNOSIS — F419 Anxiety disorder, unspecified: Secondary | ICD-10-CM | POA: Diagnosis not present

## 2018-09-03 DIAGNOSIS — G894 Chronic pain syndrome: Secondary | ICD-10-CM | POA: Diagnosis not present

## 2018-09-03 DIAGNOSIS — J329 Chronic sinusitis, unspecified: Secondary | ICD-10-CM | POA: Diagnosis not present

## 2018-09-03 DIAGNOSIS — K219 Gastro-esophageal reflux disease without esophagitis: Secondary | ICD-10-CM | POA: Diagnosis not present

## 2018-09-03 DIAGNOSIS — Z6828 Body mass index (BMI) 28.0-28.9, adult: Secondary | ICD-10-CM | POA: Diagnosis not present

## 2018-09-03 DIAGNOSIS — E663 Overweight: Secondary | ICD-10-CM | POA: Diagnosis not present

## 2018-09-04 ENCOUNTER — Encounter (HOSPITAL_COMMUNITY): Payer: Self-pay | Admitting: Internal Medicine

## 2018-09-04 ENCOUNTER — Telehealth: Payer: Self-pay

## 2018-09-04 ENCOUNTER — Encounter: Payer: Self-pay | Admitting: Internal Medicine

## 2018-09-04 NOTE — Telephone Encounter (Signed)
Per RMR- Send letter to patient.  Send copy of letter with path to referring provider and PCP.   Probably should have an ov in 3 months if not already scheduled.

## 2018-09-04 NOTE — Telephone Encounter (Signed)
OV made and letter mailed, reminders in epic

## 2018-09-24 ENCOUNTER — Ambulatory Visit (INDEPENDENT_AMBULATORY_CARE_PROVIDER_SITE_OTHER): Payer: PPO | Admitting: Otolaryngology

## 2018-09-24 DIAGNOSIS — Z85819 Personal history of malignant neoplasm of unspecified site of lip, oral cavity, and pharynx: Secondary | ICD-10-CM | POA: Diagnosis not present

## 2018-09-28 ENCOUNTER — Inpatient Hospital Stay: Payer: PPO | Attending: Hematology and Oncology | Admitting: Hematology and Oncology

## 2018-09-28 ENCOUNTER — Other Ambulatory Visit: Payer: Self-pay | Admitting: Hematology and Oncology

## 2018-09-28 ENCOUNTER — Inpatient Hospital Stay: Payer: PPO

## 2018-09-28 ENCOUNTER — Encounter: Payer: Self-pay | Admitting: Hematology and Oncology

## 2018-09-28 ENCOUNTER — Telehealth: Payer: Self-pay

## 2018-09-28 ENCOUNTER — Telehealth: Payer: Self-pay | Admitting: Hematology and Oncology

## 2018-09-28 DIAGNOSIS — R5382 Chronic fatigue, unspecified: Secondary | ICD-10-CM | POA: Insufficient documentation

## 2018-09-28 DIAGNOSIS — C01 Malignant neoplasm of base of tongue: Secondary | ICD-10-CM | POA: Diagnosis not present

## 2018-09-28 DIAGNOSIS — K117 Disturbances of salivary secretion: Secondary | ICD-10-CM | POA: Insufficient documentation

## 2018-09-28 DIAGNOSIS — R5383 Other fatigue: Secondary | ICD-10-CM

## 2018-09-28 DIAGNOSIS — N179 Acute kidney failure, unspecified: Secondary | ICD-10-CM

## 2018-09-28 DIAGNOSIS — Y842 Radiological procedure and radiotherapy as the cause of abnormal reaction of the patient, or of later complication, without mention of misadventure at the time of the procedure: Secondary | ICD-10-CM

## 2018-09-28 DIAGNOSIS — Z79899 Other long term (current) drug therapy: Secondary | ICD-10-CM | POA: Insufficient documentation

## 2018-09-28 DIAGNOSIS — K089 Disorder of teeth and supporting structures, unspecified: Secondary | ICD-10-CM

## 2018-09-28 LAB — CBC WITH DIFFERENTIAL/PLATELET
BASOS ABS: 0.1 10*3/uL (ref 0.0–0.1)
Basophils Relative: 1 %
EOS ABS: 0.3 10*3/uL (ref 0.0–0.5)
EOS PCT: 4 %
HEMATOCRIT: 45.3 % (ref 38.4–49.9)
Hemoglobin: 14.8 g/dL (ref 13.0–17.1)
LYMPHS ABS: 0.5 10*3/uL — AB (ref 0.9–3.3)
Lymphocytes Relative: 7 %
MCH: 28.1 pg (ref 27.2–33.4)
MCHC: 32.7 g/dL (ref 32.0–36.0)
MCV: 86.1 fL (ref 79.3–98.0)
Monocytes Absolute: 0.5 10*3/uL (ref 0.1–0.9)
Monocytes Relative: 7 %
NEUTROS PCT: 81 %
Neutro Abs: 5.4 10*3/uL (ref 1.5–6.5)
Platelets: 204 10*3/uL (ref 140–400)
RBC: 5.26 MIL/uL (ref 4.20–5.82)
RDW: 15.6 % — AB (ref 11.0–14.6)
WBC: 6.7 10*3/uL (ref 4.0–10.3)

## 2018-09-28 LAB — COMPREHENSIVE METABOLIC PANEL
ALT: 11 U/L (ref 0–44)
AST: 12 U/L — ABNORMAL LOW (ref 15–41)
Albumin: 3.8 g/dL (ref 3.5–5.0)
Alkaline Phosphatase: 97 U/L (ref 38–126)
Anion gap: 9 (ref 5–15)
BILIRUBIN TOTAL: 0.5 mg/dL (ref 0.3–1.2)
BUN: 17 mg/dL (ref 6–20)
CHLORIDE: 103 mmol/L (ref 98–111)
CO2: 29 mmol/L (ref 22–32)
CREATININE: 1.31 mg/dL — AB (ref 0.61–1.24)
Calcium: 10.2 mg/dL (ref 8.9–10.3)
GFR, EST NON AFRICAN AMERICAN: 58 mL/min — AB (ref >60)
Glucose, Bld: 95 mg/dL (ref 70–99)
Potassium: 4.1 mmol/L (ref 3.5–5.1)
Sodium: 141 mmol/L (ref 135–145)
TOTAL PROTEIN: 7.4 g/dL (ref 6.5–8.1)

## 2018-09-28 LAB — TSH: TSH: 3.701 u[IU]/mL (ref 0.320–4.118)

## 2018-09-28 NOTE — Assessment & Plan Note (Signed)
Clinically, he has no signs or symptoms of disease recurrence I will see him back again in 12 months I reinforced the importance of ENT follow-up

## 2018-09-28 NOTE — Assessment & Plan Note (Signed)
He has poor dentition secondary to exposure to radiation therapy We discussed the importance of aggressive oral hygiene He follows closely with his dentist.

## 2018-09-28 NOTE — Telephone Encounter (Signed)
Called and given below message. Verbalized understanding. 

## 2018-09-28 NOTE — Telephone Encounter (Signed)
-----   Message from Heath Lark, MD sent at 09/28/2018 11:14 AM EDT ----- Regarding: TSH is good Let him know the rest of labs are OK ----- Message ----- From: Interface, Lab In [place] Sent: 09/28/2018   9:43 AM EDT To: Heath Lark, MD

## 2018-09-28 NOTE — Assessment & Plan Note (Signed)
He has chronic fatigue of unknown etiology Hemoglobin is normal TSH is pending I recommend close follow-up with primary care doctor

## 2018-09-28 NOTE — Assessment & Plan Note (Signed)
He has chronic dry mouth secondary to radiation exposure He will continue frequent oral fluid intake

## 2018-09-28 NOTE — Progress Notes (Signed)
[facility] OFFICE PROGRESS NOTE  Patient Care Team: Sharilyn Sites, MD as PCP - General (Family Medicine) Leta Baptist, MD as Consulting Physician (Otolaryngology) Heath Lark, MD as Consulting Physician (Hematology and Oncology) Eppie Gibson, MD as Attending Physician (Radiation Oncology)  ASSESSMENT & PLAN:  Cancer of base of tongue ([place]) Clinically, he has no signs or symptoms of disease recurrence I will see him back again in 12 months I reinforced the importance of ENT follow-up  Xerostomia due to radiotherapy He has chronic dry mouth secondary to radiation exposure He will continue frequent oral fluid intake   Poor dentition He has poor dentition secondary to exposure to radiation therapy We discussed the importance of aggressive oral hygiene He follows closely with his dentist.  Other fatigue He has chronic fatigue of unknown etiology Hemoglobin is normal TSH is pending I recommend close follow-up with primary care doctor   No orders of the defined types were placed in this encounter.   INTERVAL HISTORY: Please see below for problem oriented charting. He returns for further follow-up He denies recent smoking or drinking He had recent EGD and colonoscopy and was found to have Barrett's esophagus He underwent esophageal dilatation to has improve his dysphagia significantly He continues to have chronic dry mouth He sees dentist regularly for dental care He has seen improvement of his taste sensation No new lymphadenopathy Denies abnormal weight loss He sees ENT on a regular basis  SUMMARY OF ONCOLOGIC HISTORY: Oncology History   Stage IVA (T1, N2b, M0) squamous cell carcinoma of base of tongue HPV (+)     Cancer of base of tongue ([place])   12/02/2015 Imaging    Ct neck showed right base of tongue cancer and large right neck cervical lymphadenopathy    12/10/2015 Pathology Results    Accession: [id number] right tongue base and right neck mass  biopsy were positive for invasive squamous cell cancer, HPV positive    12/10/2015 Procedure    He underwent laryngoscopy and biopsy of base of tongue and excision of right neck mass    01/11/2016 Imaging    PET scan: Hypermetabolic right tongue lesion without evidence of metastatic disease. A necrotic appearing right level-II lymph node, seen on 12/02/2015, is no longer visualized. 2. 1.8 cm low-attenuation lesion in the left lobe of the thyroid, nonspecific    01/22/2016 Procedure    Gastrostomy tube and port-a-cath placed.    01/25/2016 - 03/15/2016 Radiation Therapy    IMRT to BOT/Bilat neck completed [name]). Base of tongue 70 Gy in 35 fractions. High-risk neck nodal echeleons 63 Gy in 35 fractions. Intermediate-risk neck nodal echelons 56 Gy in 35 fractions    01/27/2016 - 01/27/2016 Chemotherapy    He received only 1 dose of cisplatin, complicated by uncontrolled nausea, vomiting, diarrhea and tinnitus. After much discussion, decision was made to discontinue chemotherapy permanently    05/16/2016 Procedure    Gastrostomy tube and port-a-cath removed.    07/07/2016 PET scan    Restaging PET:  1. Resolution of hypermetabolic activity in the RIGHT hypopharynx.  2. No hypermetabolic cervical lymph nodes.  3. No evidence of distant metastatic disease.      REVIEW OF SYSTEMS:   Constitutional: Denies fevers, chills or abnormal weight loss Eyes: Denies blurriness of vision Ears, nose, mouth, throat, and face: Denies mucositis or sore throat Respiratory: Denies cough, dyspnea or wheezes Cardiovascular: Denies palpitation, chest discomfort or lower extremity swelling Gastrointestinal:  Denies nausea, heartburn or change in bowel habits Skin:  Denies abnormal skin rashes Lymphatics: Denies new lymphadenopathy or easy bruising Neurological:Denies numbness, tingling or new weaknesses Behavioral/Psych: Mood is stable, no new changes  All other systems were reviewed with the patient and are  negative.  I have reviewed the past medical history, past surgical history, social history and family history with the patient and they are unchanged from previous note.  ALLERGIES:  is allergic to tetracyclines & related.  MEDICATIONS:  Current Outpatient Medications  Medication Sig Dispense Refill  . diazepam (VALIUM) 10 MG tablet Take 1 tablet (10 mg total) by mouth at bedtime as needed for anxiety or sleep. Reported on 02/16/2016 (Patient taking differently: Take 10 mg by mouth at bedtime as needed for sleep. Reported on 02/16/2016) 30 tablet 0  . HYDROcodone-acetaminophen (NORCO) 7.5-325 MG tablet Take 1 tablet by mouth 2 (two) times daily as needed for moderate pain (shoulder/disc pain).     . pantoprazole (PROTONIX) 40 MG tablet Take 40 mg by mouth daily.    . pravastatin (PRAVACHOL) 40 MG tablet Take 40 mg by mouth daily.    Marland Kitchen PROCTO-MED HC 2.5 % rectal cream PLACE 1 APPLICATION RECTALLY TWICE DAILY. (Patient taking differently: Place 1 application rectally 2 (two) times daily as needed (discomfort/hemmorroids). ) 30 g 0  . sodium fluoride (FLUORISHIELD) 1.1 % GEL dental gel Instill one drop of gel per tooth space of tray. Place over teeth for 5 minutes. Remove. Spit out excess. Repeat nightly. (Patient taking differently: Place 1 application onto teeth at bedtime. Instill one drop of gel per tooth space of tray. Place over teeth for 5 minutes. Remove. Spit out excess. Repeat nightly.) 120 mL prn   No current facility-administered medications for this visit.     PHYSICAL EXAMINATION: ECOG PERFORMANCE STATUS: 1 - Symptomatic but completely ambulatory  Vitals:   09/28/18 0946  BP: (!) 140/91  Pulse: 62  Resp: 18  Temp: 97.6 F (36.4 C)  SpO2: 100%   Filed Weights   09/28/18 0946  Weight: 218 lb 6.4 oz (99.1 kg)    GENERAL:alert, no distress and comfortable SKIN: skin color, texture, turgor are normal, no rashes or significant lesions EYES: normal, Conjunctiva are pink and  non-injected, sclera clear OROPHARYNX:no exudate, no erythema and lips, buccal mucosa, and tongue normal  NECK: Noted lymphedema around his neck LYMPH:  no palpable lymphadenopathy in the cervical, axillary or inguinal LUNGS: clear to auscultation and percussion with normal breathing effort HEART: regular rate & rhythm and no murmurs and no lower extremity edema ABDOMEN:abdomen soft, non-tender and normal bowel sounds Musculoskeletal:no cyanosis of digits and no clubbing  NEURO: alert & oriented x 3 with fluent speech, no focal motor/sensory deficits  LABORATORY DATA:  I have reviewed the data as listed    Component Value Date/Time   NA 139 08/23/2018 1005   NA 138 12/01/2016 1403   K 3.9 08/23/2018 1005   K 4.0 12/01/2016 1403   CL 103 08/23/2018 1005   CO2 26 08/23/2018 1005   CO2 30 (H) 12/01/2016 1403   GLUCOSE 99 08/23/2018 1005   GLUCOSE 94 12/01/2016 1403   BUN 12 08/23/2018 1005   BUN 11.9 12/01/2016 1403   CREATININE 1.30 (H) 08/23/2018 1005   CREATININE 1.4 (H) 12/01/2016 1403   CALCIUM 9.2 08/23/2018 1005   CALCIUM 9.7 12/01/2016 1403   PROT 7.2 03/29/2018 1105   PROT 7.2 12/01/2016 1403   ALBUMIN 3.8 03/29/2018 1105   ALBUMIN 3.5 12/01/2016 1403   AST 14 03/29/2018 1105  AST 14 12/01/2016 1403   ALT 11 03/29/2018 1105   ALT 9 12/01/2016 1403   ALKPHOS 92 03/29/2018 1105   ALKPHOS 96 12/01/2016 1403   BILITOT 0.4 03/29/2018 1105   BILITOT 0.36 12/01/2016 1403   GFRNONAA 59 (L) 08/23/2018 1005   GFRAA >60 08/23/2018 1005    No results found for: SPEP, UPEP  Lab Results  Component Value Date   WBC 6.7 09/28/2018   NEUTROABS 5.4 09/28/2018   HGB 14.8 09/28/2018   HCT 45.3 09/28/2018   MCV 86.1 09/28/2018   PLT 204 09/28/2018      Chemistry      Component Value Date/Time   NA 139 08/23/2018 1005   NA 138 12/01/2016 1403   K 3.9 08/23/2018 1005   K 4.0 12/01/2016 1403   CL 103 08/23/2018 1005   CO2 26 08/23/2018 1005   CO2 30 (H) 12/01/2016  1403   BUN 12 08/23/2018 1005   BUN 11.9 12/01/2016 1403   CREATININE 1.30 (H) 08/23/2018 1005   CREATININE 1.4 (H) 12/01/2016 1403      Component Value Date/Time   CALCIUM 9.2 08/23/2018 1005   CALCIUM 9.7 12/01/2016 1403   ALKPHOS 92 03/29/2018 1105   ALKPHOS 96 12/01/2016 1403   AST 14 03/29/2018 1105   AST 14 12/01/2016 1403   ALT 11 03/29/2018 1105   ALT 9 12/01/2016 1403   BILITOT 0.4 03/29/2018 1105   BILITOT 0.36 12/01/2016 1403      All questions were answered. The patient knows to call the clinic with any problems, questions or concerns. No barriers to learning was detected.  I spent 15 minutes counseling the patient face to face. The total time spent in the appointment was 20 minutes and more than 50% was on counseling and review of test results  Heath Lark, MD 09/28/2018 9:53 AM

## 2018-09-28 NOTE — Telephone Encounter (Signed)
Gave pt avs and calendar  °

## 2018-10-22 DIAGNOSIS — Z1389 Encounter for screening for other disorder: Secondary | ICD-10-CM | POA: Diagnosis not present

## 2018-10-22 DIAGNOSIS — Z0000 Encounter for general adult medical examination without abnormal findings: Secondary | ICD-10-CM | POA: Diagnosis not present

## 2018-10-22 DIAGNOSIS — E663 Overweight: Secondary | ICD-10-CM | POA: Diagnosis not present

## 2018-10-22 DIAGNOSIS — Z6828 Body mass index (BMI) 28.0-28.9, adult: Secondary | ICD-10-CM | POA: Diagnosis not present

## 2018-11-12 ENCOUNTER — Other Ambulatory Visit (HOSPITAL_COMMUNITY): Payer: Self-pay | Admitting: Family Medicine

## 2018-11-12 ENCOUNTER — Ambulatory Visit (HOSPITAL_COMMUNITY)
Admission: RE | Admit: 2018-11-12 | Discharge: 2018-11-12 | Disposition: A | Discharge status: Home / Self Care | Payer: PPO | Source: Ambulatory Visit | Attending: Family Medicine | Admitting: Family Medicine

## 2018-11-12 DIAGNOSIS — M79672 Pain in left foot: Secondary | ICD-10-CM

## 2018-11-12 DIAGNOSIS — Z6829 Body mass index (BMI) 29.0-29.9, adult: Secondary | ICD-10-CM | POA: Diagnosis not present

## 2018-11-12 DIAGNOSIS — E663 Overweight: Secondary | ICD-10-CM | POA: Diagnosis not present

## 2018-12-04 ENCOUNTER — Encounter: Payer: Self-pay | Admitting: Nurse Practitioner

## 2018-12-04 ENCOUNTER — Ambulatory Visit (INDEPENDENT_AMBULATORY_CARE_PROVIDER_SITE_OTHER): Payer: PPO | Admitting: Nurse Practitioner

## 2018-12-04 ENCOUNTER — Ambulatory Visit: Payer: PPO | Admitting: Gastroenterology

## 2018-12-04 VITALS — BP 144/99 | HR 68 | Temp 97.3°F | Ht 75.0 in | Wt 226.0 lb

## 2018-12-04 DIAGNOSIS — K22719 Barrett's esophagus with dysplasia, unspecified: Secondary | ICD-10-CM | POA: Diagnosis not present

## 2018-12-04 DIAGNOSIS — K2270 Barrett's esophagus without dysplasia: Secondary | ICD-10-CM | POA: Insufficient documentation

## 2018-12-04 DIAGNOSIS — D126 Benign neoplasm of colon, unspecified: Secondary | ICD-10-CM | POA: Diagnosis not present

## 2018-12-04 DIAGNOSIS — K219 Gastro-esophageal reflux disease without esophagitis: Secondary | ICD-10-CM

## 2018-12-04 NOTE — Progress Notes (Signed)
Referring Provider: Sharilyn Sites, MD Primary Care Physician:  Sharilyn Sites, MD Primary GI:  Dr. Gala Romney  Chief Complaint  Patient presents with  . Dysphagia    much better after dilation    HPI:   [name] Best is a 57 y.o. male who presents for post procedure follow-up.  The patient last seen in our office 06/15/2018 for colon adenoma and dysphasia.  At that time noted occasional solid food dysphagia, required the Heimlich maneuver when eating steak at one point.  Had a sense of dread, saw his life last before his eyes at that time.  A few episodes of random vomiting in the past few months, on Prilosec daily.  Has dysphagia most often with meats even with small pieces and chewing well.  Known history of adenomas in 2014.  Also with multiple adenomas ranging from 4 mm to 13 mm in August 2018.  Recommend early interval colonoscopy.  No other GI symptoms.  Colonoscopy was completed 08/30/2018 which found diverticulosis in the sigmoid and descending colons. Four 4 to 6 mm polyps in the descending colon, a single 8 mm polyp at hepatic flexure.  Surgical pathology found the polyps to be tubular adenoma.  Recommended repeat colonoscopy in 3 years.  EGD completed the same day found web at the cricopharyngeus status post dilation, abnormal distal esophagus consistent with Barrett's status post biopsy, normal stomach, normal duodenum.  Surgical pathology found the biopsies to be intestinal metaplasia consistent with Barrett's.  Recommended repeat EGD in 3 years for Barrett's surveillance, indefinite PPI.   Today he states he's doing much better after procedures. No further dysphagia at this time. Able to eat more liberally now. Denies GERD symptoms other than this morning due to dietary indiscretion last night. Overall rare GERD symptoms. Still on Protonix daily. Denies abdominal pain, N/V, hematochezia, melena, unintentional weight loss. Denies chest pain, dyspnea, lightheadedness, syncope, near  syncope. Denies any other upper or lower GI symptoms.  Past Medical History:  Diagnosis Date  . Anxiety   . Barrett's esophagus   . C. difficile diarrhea 2014   treated with Flagyl  . Cancer (HCC)    Stage IV squamous cell cancer of throat  . Chronic back pain   . Degenerative disc disease, lumbar   . Diverticulitis    hospitalized in 2003  . Esophageal web    s/p esopahgeal dilation  . GERD (gastroesophageal reflux disease)   . Hx of radiation therapy 01/25/16- 03/15/16   Base of Tongue, bilateral neck  . Hyperlipidemia   . PONV (postoperative nausea and vomiting)     Past Surgical History:  Procedure Laterality Date  . BIOPSY  08/30/2018   Procedure: BIOPSY;  Surgeon: Daneil Dolin, MD;  [facility];  Service: Endoscopy;;  bx's of esophagus  . CHOLECYSTECTOMY    . COLONOSCOPY N/A 07/02/2013   Jenkins:Sessile polyp (tubular adenoma) ranging between 3-40mm in size in the proximal sigmoid colon/mild diverticulosis   . COLONOSCOPY     Dr. Laural Golden: prior to 2003 per patient  . COLONOSCOPY WITH PROPOFOL N/A 08/07/2017   Dr. Gala Romney: multiple 4-13 mm polyps in rectum, sigmoid, descending, transverse, and cecum. adenomas. diverticulosis in sigmoid and descending  . COLONOSCOPY WITH PROPOFOL N/A 08/30/2018   Procedure: COLONOSCOPY WITH PROPOFOL;  Surgeon: Daneil Dolin, MD;  [facility];  Service: Endoscopy;  Laterality: N/A;  7:30am  . DIRECT LARYNGOSCOPY N/A 12/10/2015   Procedure: DIRECT LARYNGOSCOPY;  Surgeon: Leta Baptist, MD;  Location: [facility];  Service: ENT;  Laterality: N/A;  . ESOPHAGOGASTRODUODENOSCOPY N/A 07/02/2013   Jenkins:4 cm hiatal hernia/esophagus appeared normal/chronic gastritis. Clotest negative.  . ESOPHAGOGASTRODUODENOSCOPY (EGD) WITH PROPOFOL N/A 08/30/2018   Procedure: ESOPHAGOGASTRODUODENOSCOPY (EGD) WITH PROPOFOL;  Surgeon: Daneil Dolin, MD;  [facility];  Service: Endoscopy;  Laterality: N/A;  . GASTROSTOMY TUBE  CHANGE     placement and removal during chemo/radiation. no longer present  . KNEE ARTHROSCOPY WITH MEDIAL MENISECTOMY Left 04/14/2015   Procedure: KNEE ARTHROSCOPY WITH MEDIAL MENISECTOMY;  Surgeon: Sanjuana Kava, MD;  Location: [facility];  Service: Orthopedics;  Laterality: Left;  . left elbow    . MALONEY DILATION N/A 08/30/2018   Procedure: Venia Minks DILATION;  Surgeon: Daneil Dolin, MD;  [facility];  Service: Endoscopy;  Laterality: N/A;  . MASS BIOPSY Right 12/10/2015   Procedure: RIGHT EXCISIONAL NECK MASS BIOPSY;  Surgeon: Leta Baptist, MD;  Location: [facility];  Service: ENT;  Laterality: Right;  . POLYPECTOMY  08/07/2017   Procedure: POLYPECTOMY;  Surgeon: Daneil Dolin, MD;  [facility];  Service: Endoscopy;;  colon  . POLYPECTOMY  08/30/2018   Procedure: POLYPECTOMY;  Surgeon: Daneil Dolin, MD;  [facility];  Service: Endoscopy;;  polyp at hepatic flexure x2, descending colon polyps x3  . REMOVAL OF GASTROSTOMY TUBE    . TONGUE BIOPSY Right 12/10/2015   Procedure: TONGUE BIOPSY;  Surgeon: Leta Baptist, MD;  Location: [facility];  Service: ENT;  Laterality: Right;    Current Outpatient Medications  Medication Sig Dispense Refill  . diazepam (VALIUM) 10 MG tablet Take 1 tablet (10 mg total) by mouth at bedtime as needed for anxiety or sleep. Reported on 02/16/2016 (Patient taking differently: Take 10 mg by mouth at bedtime as needed for sleep. Reported on 02/16/2016) 30 tablet 0  . HYDROcodone-acetaminophen (NORCO) 7.5-325 MG tablet Take 1 tablet by mouth 2 (two) times daily as needed for moderate pain (shoulder/disc pain).     . pantoprazole (PROTONIX) 40 MG tablet Take 40 mg by mouth as needed.     . pravastatin (PRAVACHOL) 40 MG tablet Take 40 mg by mouth daily.    Marland Kitchen PROCTO-MED HC 2.5 % rectal cream PLACE 1 APPLICATION RECTALLY TWICE DAILY. (Patient taking differently: Place 1 application rectally 2 (two) times daily as needed  (discomfort/hemmorroids). ) 30 g 0  . sodium fluoride (FLUORISHIELD) 1.1 % GEL dental gel Instill one drop of gel per tooth space of tray. Place over teeth for 5 minutes. Remove. Spit out excess. Repeat nightly. (Patient taking differently: Place 1 application onto teeth at bedtime. Instill one drop of gel per tooth space of tray. Place over teeth for 5 minutes. Remove. Spit out excess. Repeat nightly.) 120 mL prn   No current facility-administered medications for this visit.     Allergies as of 12/04/2018 - Review Complete 12/04/2018  Allergen Reaction Noted  . Tetracyclines & related Hives 08/16/2011    Family History  Problem Relation Age of Onset  . Heart failure Mother 52  . Heart attack Father 80  . Heart disease Unknown   . Cancer Paternal Uncle        died of cancer  . Colon cancer Neg Hx   . Colon polyps Neg Hx   . Gastric cancer Neg Hx   . Esophageal cancer Neg Hx     Social History   Socioeconomic History  . Marital status:  Married    Spouse name: Not on file  . Number of children: 2  . Years of education: 12th grade  . Highest education level: Not on file  Occupational History  . Occupation: retired  Scientific laboratory technician  . Financial resource strain: Not on file  . Food insecurity:    Worry: Not on file    Inability: Not on file  . Transportation needs:    Medical: Not on file    Non-medical: Not on file  Tobacco Use  . Smoking status: Former Smoker    Types: Cigarettes  . Smokeless tobacco: Never Used  Substance and Sexual Activity  . Alcohol use: No  . Drug use: Yes    Types: Marijuana    Comment: "sometimes for appetite"  . Sexual activity: Yes    Birth control/protection: None    Comment: married, retired, 1 son & 1 daughter  Lifestyle  . Physical activity:    Days per week: Not on file    Minutes per session: Not on file  . Stress: Not on file  Relationships  . Social connections:    Talks on phone: Not on file    Gets together: Not on file     Attends religious service: Not on file    Active member of club or organization: Not on file    Attends meetings of clubs or organizations: Not on file    Relationship status: Not on file  Other Topics Concern  . Not on file  Social History Narrative   Lives with mother.      Review of Systems: General: Negative for anorexia, weight loss, fever, chills, fatigue, weakness. ENT: Negative for hoarseness, difficulty swallowing , nasal congestion. CV: Negative for chest pain, angina, palpitations, peripheral edema.  Respiratory: Negative for dyspnea at rest, cough, sputum, wheezing.  GI: See history of present illness. Endo: Negative for unusual weight change.  Heme: Negative for bruising or bleeding.   Physical Exam: BP (!) 144/99   Pulse 68   Temp (!) 97.3 F (36.3 C) (Oral)   Ht 6\' 3"  (1.905 m)   Wt 226 lb (102.5 kg)   BMI 28.25 kg/m  General:   Alert and oriented. Pleasant and cooperative. Well-nourished and well-developed.  Eyes:  Without icterus, sclera clear and conjunctiva pink.  Ears:  Normal auditory acuity. Cardiovascular:  S1, S2 present without murmurs appreciated. Extremities without clubbing or edema. Respiratory:  Clear to auscultation bilaterally. No wheezes, rales, or rhonchi. No distress.  Gastrointestinal:  +BS, soft, non-tender and non-distended. No HSM noted. No guarding or rebound. No masses appreciated.  Rectal:  Deferred  Musculoskalatal:  Symmetrical without gross deformities. Skin:  Intact without significant lesions or rashes. Neurologic:  Alert and oriented x4;  grossly normal neurologically. Psych:  Alert and cooperative. Normal mood and affect. Heme/Lymph/Immune: No excessive bruising noted.    12/04/2018 9:08 AM   Disclaimer: This note was dictated with voice recognition software. Similar sounding words can inadvertently be transcribed and may not be corrected upon review.

## 2018-12-04 NOTE — Assessment & Plan Note (Signed)
Noted Barrett's esophagus confirmed by pathology of biopsies on recent EGD.  Discussed indefinite PPI.  He will be due for repeat EGD in 3 years.  He is to notify us of any significant symptoms before then.  Follow-up in 1 year.

## 2018-12-04 NOTE — Assessment & Plan Note (Signed)
History of GERD.  Generally well controlled on PPI other than occasional dietary indiscretions, such as last night.  Recommend he continue Protonix indefinitely due to Barrett's esophagus.  Call with any worsening symptoms, questions, concerns.  Follow-up in 1 year otherwise.

## 2018-12-04 NOTE — Progress Notes (Signed)
CC'ED TO PCP 

## 2018-12-04 NOTE — Patient Instructions (Signed)
1. Continue taking your current medications. 2. You will need to be on a proton pump inhibitor (such as Protonix) indefinitely. 3. If anyone tries to stop your Protonix have them call our office. 4. Return for follow-up in 1 year. 5. You will need to repeat your colonoscopy and endoscopy in 3 years. 6. Call us with any questions or concerns.  At [facility] we value your feedback. You may receive a survey about your visit today. Please share your experience as we strive to create trusting relationships with our patients to provide genuine, compassionate, quality care.  We appreciate your understanding and patience as we review any laboratory studies, imaging, and other diagnostic tests that are ordered as we care for you. Our office policy is 5 business days for review of these results, and any emergent or urgent results are addressed in a timely manner for your best interest. If you do not hear from our office in 1 week, please contact us.   We also encourage the use of MyChart, which contains your medical information for your review as well. If you are not enrolled in this feature, an access code is on this after visit summary for your convenience. Thank you for allowing [place] to be involved in your care.  It was great to see you today!  I hope you have a Merry Christmas!!

## 2018-12-04 NOTE — Assessment & Plan Note (Signed)
History of frequent adenomas.  Most recent colonoscopy just completed, recommended repeat colonoscopy in 3 years due to multiple tubular adenoma polyps historically.  At this point he is not having any GI symptoms.  Recommend follow-up in 1 year, call us with any worsening symptoms or problems before then.

## 2018-12-05 DIAGNOSIS — E663 Overweight: Secondary | ICD-10-CM | POA: Diagnosis not present

## 2018-12-05 DIAGNOSIS — Z6829 Body mass index (BMI) 29.0-29.9, adult: Secondary | ICD-10-CM | POA: Diagnosis not present

## 2018-12-05 DIAGNOSIS — G894 Chronic pain syndrome: Secondary | ICD-10-CM | POA: Diagnosis not present

## 2019-01-10 ENCOUNTER — Other Ambulatory Visit: Payer: Self-pay | Admitting: Nephrology

## 2019-01-10 DIAGNOSIS — N183 Chronic kidney disease, stage 3 (moderate): Secondary | ICD-10-CM | POA: Diagnosis not present

## 2019-01-10 DIAGNOSIS — N189 Chronic kidney disease, unspecified: Secondary | ICD-10-CM

## 2019-01-10 DIAGNOSIS — I10 Essential (primary) hypertension: Secondary | ICD-10-CM | POA: Diagnosis not present

## 2019-01-10 DIAGNOSIS — C140 Malignant neoplasm of pharynx, unspecified: Secondary | ICD-10-CM | POA: Diagnosis not present

## 2019-01-23 DIAGNOSIS — E663 Overweight: Secondary | ICD-10-CM | POA: Diagnosis not present

## 2019-01-23 DIAGNOSIS — J069 Acute upper respiratory infection, unspecified: Secondary | ICD-10-CM | POA: Diagnosis not present

## 2019-01-23 DIAGNOSIS — F419 Anxiety disorder, unspecified: Secondary | ICD-10-CM | POA: Diagnosis not present

## 2019-01-23 DIAGNOSIS — Z1389 Encounter for screening for other disorder: Secondary | ICD-10-CM | POA: Diagnosis not present

## 2019-01-23 DIAGNOSIS — Z6829 Body mass index (BMI) 29.0-29.9, adult: Secondary | ICD-10-CM | POA: Diagnosis not present

## 2019-01-24 DIAGNOSIS — Z79899 Other long term (current) drug therapy: Secondary | ICD-10-CM | POA: Diagnosis not present

## 2019-01-24 DIAGNOSIS — R809 Proteinuria, unspecified: Secondary | ICD-10-CM | POA: Diagnosis not present

## 2019-01-24 DIAGNOSIS — N189 Chronic kidney disease, unspecified: Secondary | ICD-10-CM | POA: Diagnosis not present

## 2019-01-24 DIAGNOSIS — D649 Anemia, unspecified: Secondary | ICD-10-CM | POA: Diagnosis not present

## 2019-01-24 DIAGNOSIS — I10 Essential (primary) hypertension: Secondary | ICD-10-CM | POA: Diagnosis not present

## 2019-01-24 DIAGNOSIS — E559 Vitamin D deficiency, unspecified: Secondary | ICD-10-CM | POA: Diagnosis not present

## 2019-01-25 ENCOUNTER — Ambulatory Visit (HOSPITAL_COMMUNITY)
Admission: RE | Admit: 2019-01-25 | Discharge: 2019-01-25 | Disposition: A | Discharge status: Home / Self Care | Payer: PPO | Source: Ambulatory Visit | Attending: Nephrology | Admitting: Nephrology

## 2019-01-25 DIAGNOSIS — N189 Chronic kidney disease, unspecified: Secondary | ICD-10-CM | POA: Diagnosis not present

## 2019-02-21 DIAGNOSIS — N182 Chronic kidney disease, stage 2 (mild): Secondary | ICD-10-CM | POA: Diagnosis not present

## 2019-02-21 DIAGNOSIS — I10 Essential (primary) hypertension: Secondary | ICD-10-CM | POA: Diagnosis not present

## 2019-02-21 DIAGNOSIS — E889 Metabolic disorder, unspecified: Secondary | ICD-10-CM | POA: Diagnosis not present

## 2019-02-21 DIAGNOSIS — M9080 Osteopathy in diseases classified elsewhere, unspecified site: Secondary | ICD-10-CM | POA: Diagnosis not present

## 2019-03-02 IMAGING — MR MR SHOULDER*L* W/O CM
4 of 6 series · 21 of 40 positions shown · non-contrast
Comparison: None.

CLINICAL DATA: Left shoulder pain for 10 months.

EXAM:
MRI OF THE LEFT SHOULDER WITHOUT CONTRAST
TECHNIQUE: Multiplanar, multisequence MR imaging of the shoulder was performed.
No intravenous contrast was administered.

[Series 3: t2fs axial · axial · 3.0mm · 0.31mm/px · z∈[-61,+2]mm · 5 of 24 slices shown]
[im 1/24]
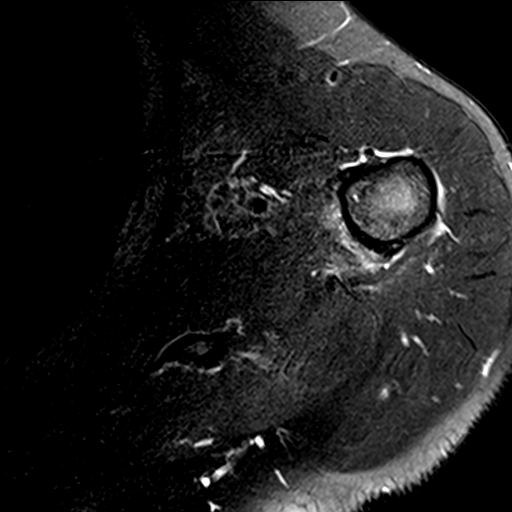
[im 4/24]
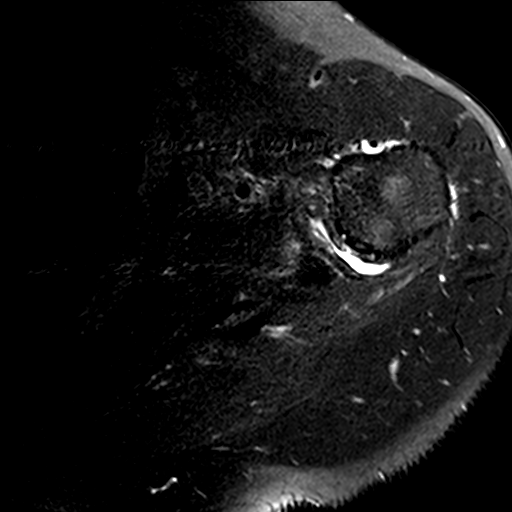
[im 8/24]
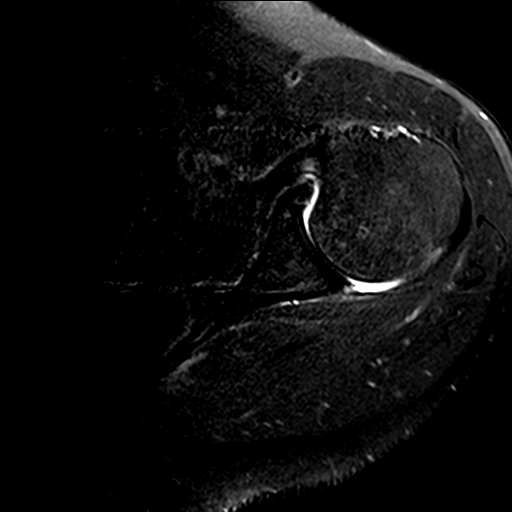
[im 12/24]
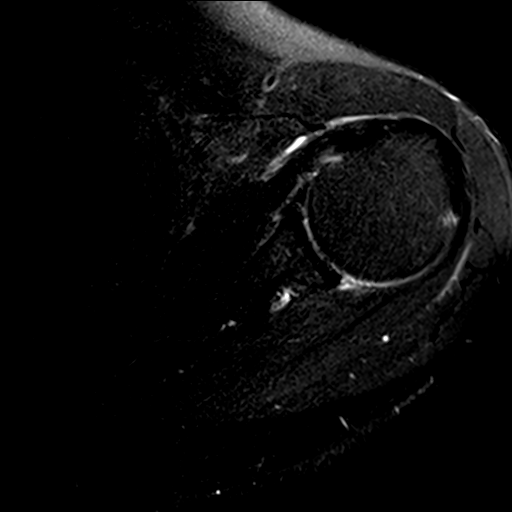
[im 20/24]
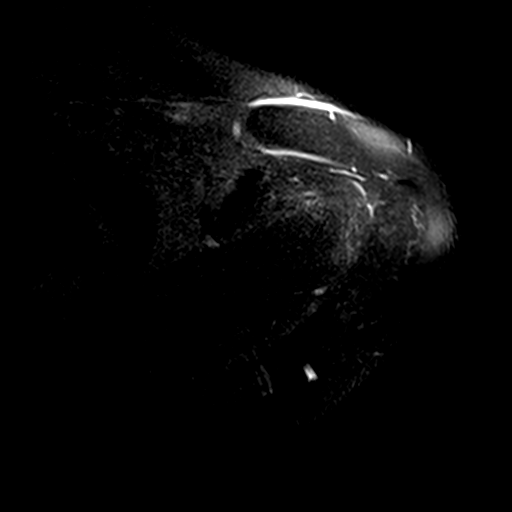

[Series 4: PD · oblique · 3.0mm · 0.31mm/px · 6 of 22 slices shown]
[im 1/22]
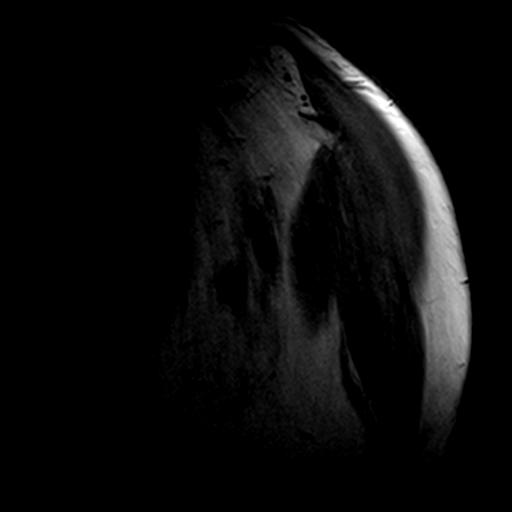
[im 5/22]
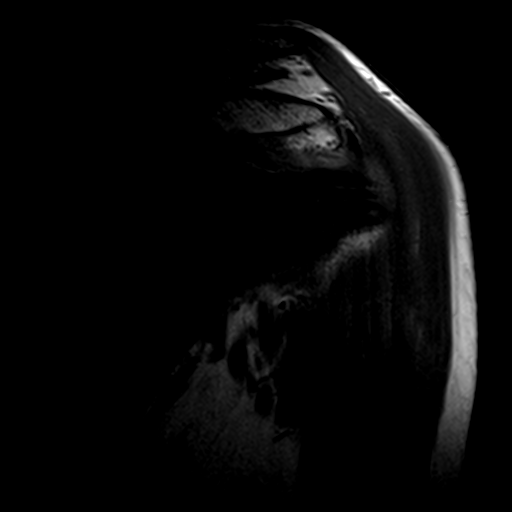
[im 9/22]
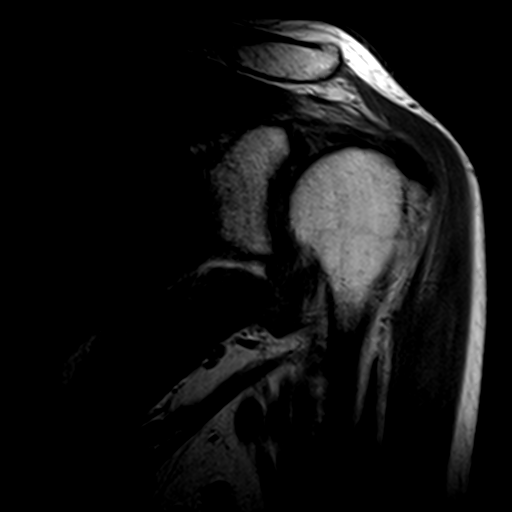
[im 13/22]
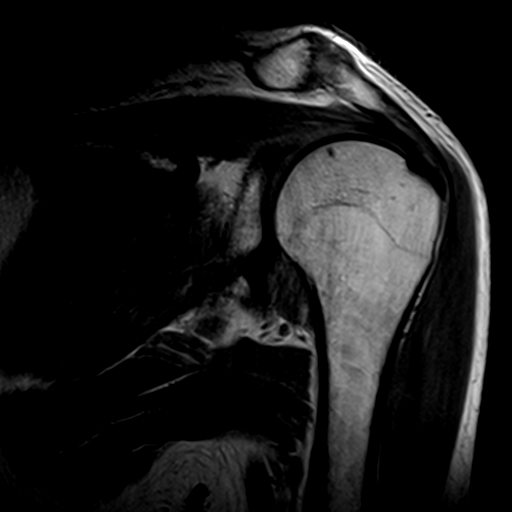
[im 17/22]
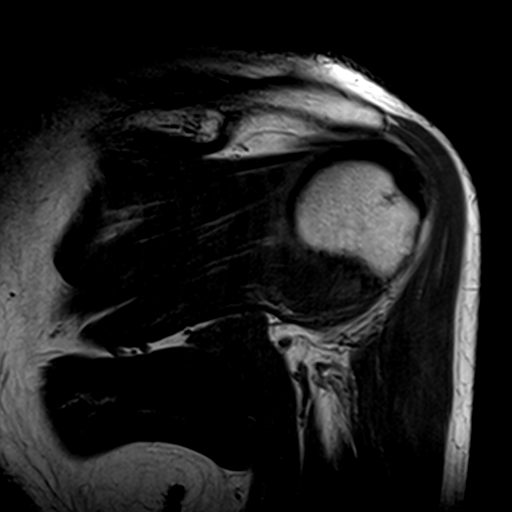
[im 22/22]
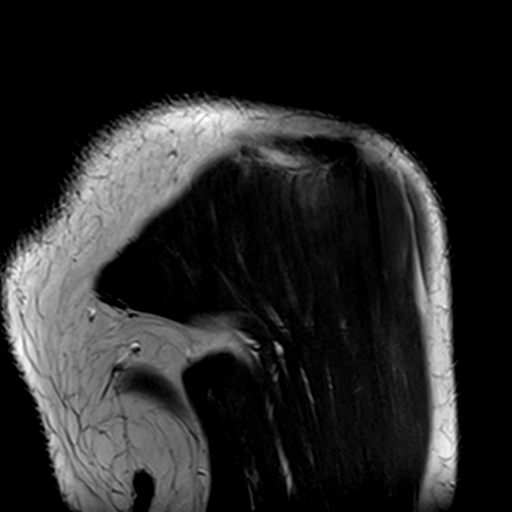

[Series 5: t2fs_blade_cor · oblique · 3.0mm · 0.62mm/px · 3 of 22 slices shown]
[im 5/22]
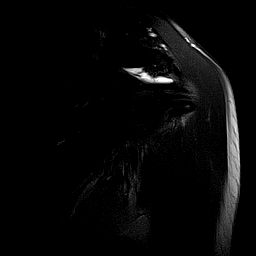
[im 13/22]
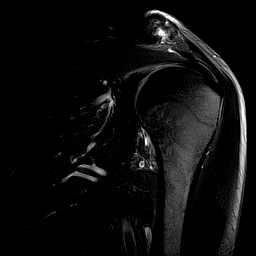
[im 22/22]
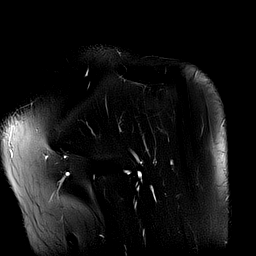

[Series 6: T1 · oblique · 3.0mm · 0.31mm/px · 7 of 26 slices shown]
[im 1/26]
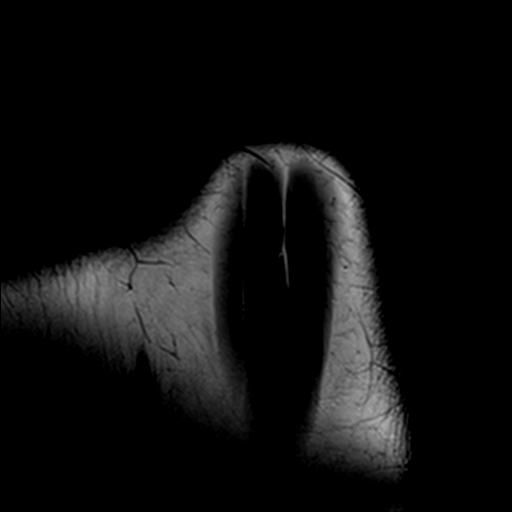
[im 5/26]
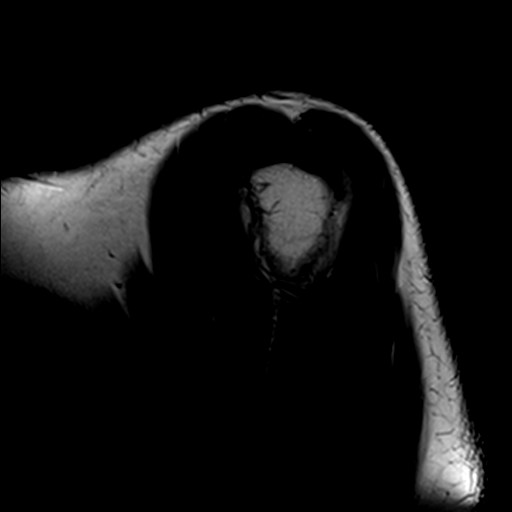
[im 9/26]
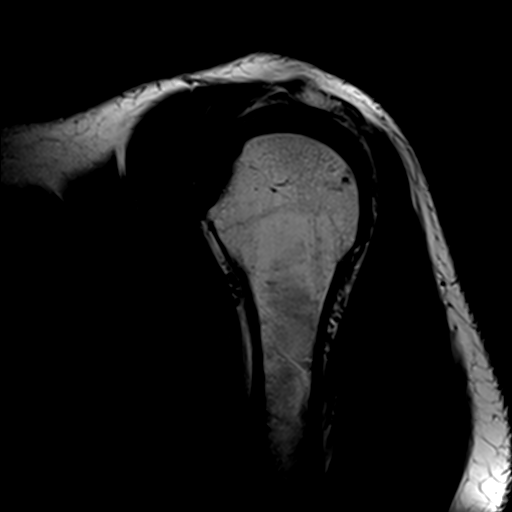
[im 13/26]
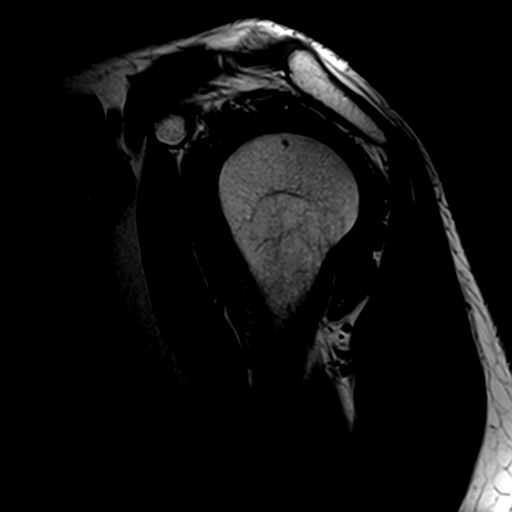
[im 17/26]
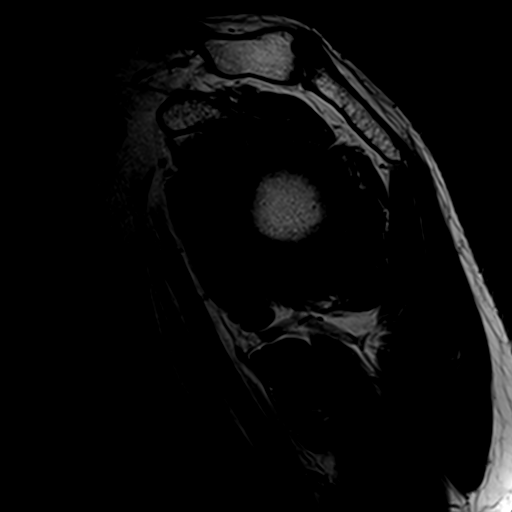
[im 21/26]
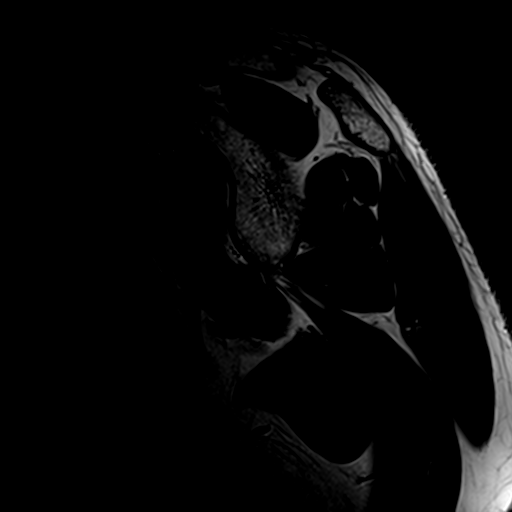
[im 26/26]
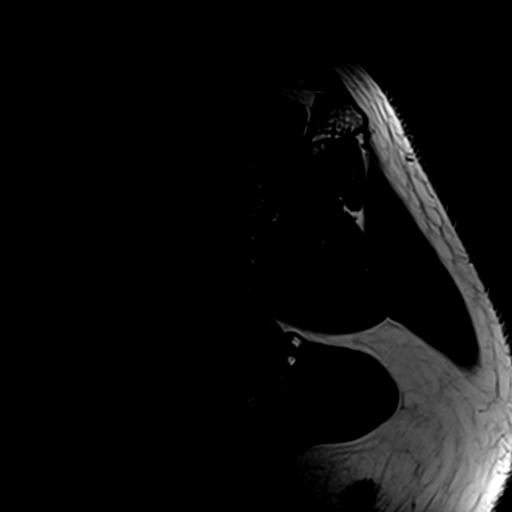

[21 of 40 positions shown; findings below may reference images not displayed]

FINDINGS: Rotator cuff: Mild to moderate rotator cuff tendinopathy/tendinosis.
No partial or full-thickness rotator cuff tear.

Muscles:  Normal

Biceps long head:  Intact

Acromioclavicular Joint: Mild AC joint degenerative changes. Type 1
acromion. Significant lateral downsloping of the acromion.

Glenohumeral Joint: Small joint effusion and significant rotator
interval and axillary recess synovitis.

Labrum:  No definite labral tears.

Bones:  No acute bony findings

Other: No subacromial/subdeltoid fluid collections to suggest
bursitis.
IMPRESSION: 1. Mild to moderate rotator cuff tendinopathy/tendinosis but no
partial or full-thickness tear.
2. Intact long head biceps tendon and glenoid labrum.
3. Significant lateral downsloping of a type 1 acromion possibly
contributing to bony impingement. Mild AC joint degenerative changes
are also noted.
4. Small glenohumeral joint effusion and significant rotator
interval and axillary recess synovitis.

## 2019-03-05 DIAGNOSIS — Z6829 Body mass index (BMI) 29.0-29.9, adult: Secondary | ICD-10-CM | POA: Diagnosis not present

## 2019-03-05 DIAGNOSIS — Z0001 Encounter for general adult medical examination with abnormal findings: Secondary | ICD-10-CM | POA: Diagnosis not present

## 2019-03-05 DIAGNOSIS — E663 Overweight: Secondary | ICD-10-CM | POA: Diagnosis not present

## 2019-03-05 DIAGNOSIS — I7779 Dissection of other artery: Secondary | ICD-10-CM | POA: Diagnosis not present

## 2019-05-02 ENCOUNTER — Ambulatory Visit (INDEPENDENT_AMBULATORY_CARE_PROVIDER_SITE_OTHER): Payer: PPO | Admitting: Otolaryngology

## 2019-05-02 DIAGNOSIS — Z85819 Personal history of malignant neoplasm of unspecified site of lip, oral cavity, and pharynx: Secondary | ICD-10-CM | POA: Diagnosis not present

## 2019-07-31 DIAGNOSIS — F419 Anxiety disorder, unspecified: Secondary | ICD-10-CM | POA: Diagnosis not present

## 2019-09-26 ENCOUNTER — Other Ambulatory Visit: Payer: Self-pay | Admitting: Hematology and Oncology

## 2019-09-26 DIAGNOSIS — C01 Malignant neoplasm of base of tongue: Secondary | ICD-10-CM

## 2019-09-27 ENCOUNTER — Encounter: Payer: Self-pay | Admitting: Hematology and Oncology

## 2019-09-27 ENCOUNTER — Inpatient Hospital Stay: Payer: PPO | Attending: Hematology and Oncology

## 2019-09-27 ENCOUNTER — Telehealth: Payer: Self-pay | Admitting: *Deleted

## 2019-09-27 ENCOUNTER — Telehealth: Payer: Self-pay | Admitting: Hematology and Oncology

## 2019-09-27 ENCOUNTER — Other Ambulatory Visit: Payer: Self-pay

## 2019-09-27 ENCOUNTER — Inpatient Hospital Stay (HOSPITAL_BASED_OUTPATIENT_CLINIC_OR_DEPARTMENT_OTHER): Payer: PPO | Admitting: Hematology and Oncology

## 2019-09-27 ENCOUNTER — Other Ambulatory Visit: Payer: Self-pay | Admitting: Hematology and Oncology

## 2019-09-27 DIAGNOSIS — Z79899 Other long term (current) drug therapy: Secondary | ICD-10-CM | POA: Diagnosis not present

## 2019-09-27 DIAGNOSIS — R030 Elevated blood-pressure reading, without diagnosis of hypertension: Secondary | ICD-10-CM | POA: Insufficient documentation

## 2019-09-27 DIAGNOSIS — C01 Malignant neoplasm of base of tongue: Secondary | ICD-10-CM

## 2019-09-27 DIAGNOSIS — Y842 Radiological procedure and radiotherapy as the cause of abnormal reaction of the patient, or of later complication, without mention of misadventure at the time of the procedure: Secondary | ICD-10-CM

## 2019-09-27 DIAGNOSIS — K117 Disturbances of salivary secretion: Secondary | ICD-10-CM | POA: Diagnosis not present

## 2019-09-27 DIAGNOSIS — R1310 Dysphagia, unspecified: Secondary | ICD-10-CM | POA: Insufficient documentation

## 2019-09-27 DIAGNOSIS — E039 Hypothyroidism, unspecified: Secondary | ICD-10-CM

## 2019-09-27 DIAGNOSIS — K089 Disorder of teeth and supporting structures, unspecified: Secondary | ICD-10-CM | POA: Diagnosis not present

## 2019-09-27 LAB — CBC WITH DIFFERENTIAL/PLATELET
Abs Immature Granulocytes: 0.06 10*3/uL (ref 0.00–0.07)
Basophils Absolute: 0.1 10*3/uL (ref 0.0–0.1)
Basophils Relative: 1 %
Eosinophils Absolute: 0.2 10*3/uL (ref 0.0–0.5)
Eosinophils Relative: 3 %
HCT: 47.6 % (ref 39.0–52.0)
Hemoglobin: 15.3 g/dL (ref 13.0–17.0)
Immature Granulocytes: 1 %
Lymphocytes Relative: 9 %
Lymphs Abs: 0.6 10*3/uL — ABNORMAL LOW (ref 0.7–4.0)
MCH: 28.3 pg (ref 26.0–34.0)
MCHC: 32.1 g/dL (ref 30.0–36.0)
MCV: 88.1 fL (ref 80.0–100.0)
Monocytes Absolute: 0.6 10*3/uL (ref 0.1–1.0)
Monocytes Relative: 9 %
Neutro Abs: 5.4 10*3/uL (ref 1.7–7.7)
Neutrophils Relative %: 77 %
Platelets: 217 10*3/uL (ref 150–400)
RBC: 5.4 MIL/uL (ref 4.22–5.81)
RDW: 14.7 % (ref 11.5–15.5)
WBC: 6.9 10*3/uL (ref 4.0–10.5)
nRBC: 0 % (ref 0.0–0.2)

## 2019-09-27 LAB — COMPREHENSIVE METABOLIC PANEL
ALT: 12 U/L (ref 0–44)
AST: 14 U/L — ABNORMAL LOW (ref 15–41)
Albumin: 3.9 g/dL (ref 3.5–5.0)
Alkaline Phosphatase: 107 U/L (ref 38–126)
Anion gap: 12 (ref 5–15)
BUN: 13 mg/dL (ref 6–20)
CO2: 27 mmol/L (ref 22–32)
Calcium: 9.4 mg/dL (ref 8.9–10.3)
Chloride: 103 mmol/L (ref 98–111)
Creatinine, Ser: 1.39 mg/dL — ABNORMAL HIGH (ref 0.61–1.24)
GFR calc Af Amer: >60 mL/min (ref >60)
GFR calc non Af Amer: 55 mL/min — ABNORMAL LOW (ref >60)
Glucose, Bld: 112 mg/dL — ABNORMAL HIGH (ref 70–99)
Potassium: 4 mmol/L (ref 3.5–5.1)
Sodium: 142 mmol/L (ref 135–145)
Total Bilirubin: 0.5 mg/dL (ref 0.3–1.2)
Total Protein: 7.3 g/dL (ref 6.5–8.1)

## 2019-09-27 LAB — TSH: TSH: 5.182 u[IU]/mL — ABNORMAL HIGH (ref 0.320–4.118)

## 2019-09-27 MED ORDER — LEVOTHYROXINE SODIUM 25 MCG PO TABS: 25.0000 ug | ORAL_TABLET | Freq: Every day | ORAL | 3 refills | Status: AC

## 2019-09-27 NOTE — Telephone Encounter (Signed)
Telephone call to patient to advise results and new medication. Patient verbalized an understanding and will call his pcp Monday to schedule a follow up.

## 2019-09-27 NOTE — Assessment & Plan Note (Signed)
He has poor dentition secondary to exposure to radiation therapy We discussed the importance of aggressive oral hygiene He follows closely with his dentist.

## 2019-09-27 NOTE — Assessment & Plan Note (Signed)
He has chronic dry mouth secondary to radiation exposure He will continue frequent oral fluid intake

## 2019-09-27 NOTE — Assessment & Plan Note (Addendum)
Clinically, he has no signs or symptoms of disease recurrence I will see him back again in 12 months I reinforced the importance of ENT follow-up  The patient is at risk for acquired hypothyroidism due to radiation exposure We will call him with test results today I recommend TSH monitoring through his primary care doctor's office in the future

## 2019-09-27 NOTE — Assessment & Plan Note (Signed)
He has elevated blood pressure but his blood pressure monitoring at home was satisfactory I will defer to his primary care doctor for management He likely has whitecoat hypertension

## 2019-09-27 NOTE — Telephone Encounter (Signed)
I could not reach patient the line kept disconnecting

## 2019-09-27 NOTE — Telephone Encounter (Signed)
-----   Message from Heath Lark, MD sent at 09/27/2019 10:43 AM EDT ----- Regarding: abnormal TSH PLs let him know he needs to start thyroid supplement I sent prescription to his local pharmacy He needs follow-up and PCP to manage in the future (3 months is ok)

## 2019-09-27 NOTE — Progress Notes (Signed)
[facility] OFFICE PROGRESS NOTE  Patient Care Team: Sharilyn Sites, MD as PCP - General (Family Medicine) Leta Baptist, MD as Consulting Physician (Otolaryngology) Heath Lark, MD as Consulting Physician (Hematology and Oncology) Eppie Gibson, MD as Attending Physician (Radiation Oncology) Daneil Dolin, MD as Consulting Physician (Gastroenterology)  ASSESSMENT & PLAN:  Cancer of base of tongue ([place]) Clinically, he has no signs or symptoms of disease recurrence I will see him back again in 12 months I reinforced the importance of ENT follow-up  The patient is at risk for acquired hypothyroidism due to radiation exposure We will call him with test results today I recommend TSH monitoring through his primary care doctor's office in the future  Poor dentition He has poor dentition secondary to exposure to radiation therapy We discussed the importance of aggressive oral hygiene He follows closely with his dentist.  Xerostomia due to radiotherapy He has chronic dry mouth secondary to radiation exposure He will continue frequent oral fluid intake   Elevated BP without diagnosis of hypertension He has elevated blood pressure but his blood pressure monitoring at home was satisfactory I will defer to his primary care doctor for management He likely has whitecoat hypertension   No orders of the defined types were placed in this encounter.   INTERVAL HISTORY: Please see below for problem oriented charting. He returns for further follow-up He denies recent new tongue lesion on neck mass He continues to have chronic dry mouth, poor dentition and mild dysphagia but overall he is doing well Denies recent smoking or drinking He continues regular follow-up with ENT with negative evaluation recently  SUMMARY OF ONCOLOGIC HISTORY: Oncology History Overview Note  Stage IVA (T1, N2b, M0) squamous cell carcinoma of base of tongue HPV (+)   Cancer of base of tongue ([place])   12/02/2015 Imaging   Ct neck showed right base of tongue cancer and large right neck cervical lymphadenopathy   12/10/2015 Pathology Results   Accession: [id number] right tongue base and right neck mass biopsy were positive for invasive squamous cell cancer, HPV positive   12/10/2015 Procedure   He underwent laryngoscopy and biopsy of base of tongue and excision of right neck mass   01/11/2016 Imaging   PET scan: Hypermetabolic right tongue lesion without evidence of metastatic disease. A necrotic appearing right level-II lymph node, seen on 12/02/2015, is no longer visualized. 2. 1.8 cm low-attenuation lesion in the left lobe of the thyroid, nonspecific   01/22/2016 Procedure   Gastrostomy tube and port-a-cath placed.   01/25/2016 - 03/15/2016 Radiation Therapy   IMRT to BOT/Bilat neck completed [name]). Base of tongue 70 Gy in 35 fractions. High-risk neck nodal echeleons 63 Gy in 35 fractions. Intermediate-risk neck nodal echelons 56 Gy in 35 fractions   01/27/2016 - 01/27/2016 Chemotherapy   He received only 1 dose of cisplatin, complicated by uncontrolled nausea, vomiting, diarrhea and tinnitus. After much discussion, decision was made to discontinue chemotherapy permanently   05/16/2016 Procedure   Gastrostomy tube and port-a-cath removed.   07/07/2016 PET scan   Restaging PET:  1. Resolution of hypermetabolic activity in the RIGHT hypopharynx.  2. No hypermetabolic cervical lymph nodes.  3. No evidence of distant metastatic disease.      REVIEW OF SYSTEMS:   Constitutional: Denies fevers, chills or abnormal weight loss Eyes: Denies blurriness of vision Ears, nose, mouth, throat, and face: Denies mucositis or sore throat Respiratory: Denies cough, dyspnea or wheezes Cardiovascular: Denies palpitation, chest discomfort or lower extremity  swelling Gastrointestinal:  Denies nausea, heartburn or change in bowel habits Skin: Denies abnormal skin rashes Lymphatics: Denies new  lymphadenopathy or easy bruising Neurological:Denies numbness, tingling or new weaknesses Behavioral/Psych: Mood is stable, no new changes  All other systems were reviewed with the patient and are negative.  I have reviewed the past medical history, past surgical history, social history and family history with the patient and they are unchanged from previous note.  ALLERGIES:  is allergic to tetracyclines & related.  MEDICATIONS:  Current Outpatient Medications  Medication Sig Dispense Refill  . diazepam (VALIUM) 10 MG tablet Take 1 tablet (10 mg total) by mouth at bedtime as needed for anxiety or sleep. Reported on 02/16/2016 (Patient taking differently: Take 10 mg by mouth at bedtime as needed for sleep. Reported on 02/16/2016) 30 tablet 0  . pantoprazole (PROTONIX) 40 MG tablet Take 40 mg by mouth as needed.     . pravastatin (PRAVACHOL) 40 MG tablet Take 40 mg by mouth daily.    Marland Kitchen PROCTO-MED HC 2.5 % rectal cream PLACE 1 APPLICATION RECTALLY TWICE DAILY. (Patient taking differently: Place 1 application rectally 2 (two) times daily as needed (discomfort/hemmorroids). ) 30 g 0  . sodium fluoride (FLUORISHIELD) 1.1 % GEL dental gel Instill one drop of gel per tooth space of tray. Place over teeth for 5 minutes. Remove. Spit out excess. Repeat nightly. (Patient taking differently: Place 1 application onto teeth at bedtime. Instill one drop of gel per tooth space of tray. Place over teeth for 5 minutes. Remove. Spit out excess. Repeat nightly.) 120 mL prn   No current facility-administered medications for this visit.     PHYSICAL EXAMINATION: ECOG PERFORMANCE STATUS: 1 - Symptomatic but completely ambulatory  Vitals:   09/27/19 0930  BP: (!) 151/104  Pulse: 68  Resp: 17  Temp: 98.3 F (36.8 C)  SpO2: 100%   Filed Weights   09/27/19 0930  Weight: 231 lb (104.8 kg)    GENERAL:alert, no distress and comfortable SKIN: skin color, texture, turgor are normal, no rashes or significant  lesions EYES: normal, Conjunctiva are pink and non-injected, sclera clear OROPHARYNX:no exudate, no erythema and lips, buccal mucosa, and tongue normal  NECK: supple, thyroid normal size, non-tender, without nodularity LYMPH:  no palpable lymphadenopathy in the cervical, axillary or inguinal LUNGS: clear to auscultation and percussion with normal breathing effort HEART: regular rate & rhythm and no murmurs and no lower extremity edema ABDOMEN:abdomen soft, non-tender and normal bowel sounds Musculoskeletal:no cyanosis of digits and no clubbing  NEURO: alert & oriented x 3 with fluent speech, no focal motor/sensory deficits  LABORATORY DATA:  I have reviewed the data as listed    Component Value Date/Time   NA 141 09/28/2018 0929   NA 138 12/01/2016 1403   K 4.1 09/28/2018 0929   K 4.0 12/01/2016 1403   CL 103 09/28/2018 0929   CO2 29 09/28/2018 0929   CO2 30 (H) 12/01/2016 1403   GLUCOSE 95 09/28/2018 0929   GLUCOSE 94 12/01/2016 1403   BUN 17 09/28/2018 0929   BUN 11.9 12/01/2016 1403   CREATININE 1.31 (H) 09/28/2018 0929   CREATININE 1.4 (H) 12/01/2016 1403   CALCIUM 10.2 09/28/2018 0929   CALCIUM 9.7 12/01/2016 1403   PROT 7.4 09/28/2018 0929   PROT 7.2 12/01/2016 1403   ALBUMIN 3.8 09/28/2018 0929   ALBUMIN 3.5 12/01/2016 1403   AST 12 (L) 09/28/2018 0929   AST 14 12/01/2016 1403   ALT 11 09/28/2018 0929  ALT 9 12/01/2016 1403   ALKPHOS 97 09/28/2018 0929   ALKPHOS 96 12/01/2016 1403   BILITOT 0.5 09/28/2018 0929   BILITOT 0.36 12/01/2016 1403   GFRNONAA 58 (L) 09/28/2018 0929   GFRAA >60 09/28/2018 0929    No results found for: SPEP, UPEP  Lab Results  Component Value Date   WBC 6.9 09/27/2019   NEUTROABS 5.4 09/27/2019   HGB 15.3 09/27/2019   HCT 47.6 09/27/2019   MCV 88.1 09/27/2019   PLT 217 09/27/2019      Chemistry      Component Value Date/Time   NA 141 09/28/2018 0929   NA 138 12/01/2016 1403   K 4.1 09/28/2018 0929   K 4.0 12/01/2016 1403    CL 103 09/28/2018 0929   CO2 29 09/28/2018 0929   CO2 30 (H) 12/01/2016 1403   BUN 17 09/28/2018 0929   BUN 11.9 12/01/2016 1403   CREATININE 1.31 (H) 09/28/2018 0929   CREATININE 1.4 (H) 12/01/2016 1403      Component Value Date/Time   CALCIUM 10.2 09/28/2018 0929   CALCIUM 9.7 12/01/2016 1403   ALKPHOS 97 09/28/2018 0929   ALKPHOS 96 12/01/2016 1403   AST 12 (L) 09/28/2018 0929   AST 14 12/01/2016 1403   ALT 11 09/28/2018 0929   ALT 9 12/01/2016 1403   BILITOT 0.5 09/28/2018 0929   BILITOT 0.36 12/01/2016 1403      All questions were answered. The patient knows to call the clinic with any problems, questions or concerns. No barriers to learning was detected.  I spent 15 minutes counseling the patient face to face. The total time spent in the appointment was 20 minutes and more than 50% was on counseling and review of test results  Heath Lark, MD 09/27/2019 9:42 AM

## 2019-10-31 DIAGNOSIS — F419 Anxiety disorder, unspecified: Secondary | ICD-10-CM | POA: Diagnosis not present

## 2019-10-31 DIAGNOSIS — E7849 Other hyperlipidemia: Secondary | ICD-10-CM | POA: Diagnosis not present

## 2019-11-12 ENCOUNTER — Encounter: Payer: Self-pay | Admitting: Internal Medicine

## 2019-11-14 ENCOUNTER — Ambulatory Visit (INDEPENDENT_AMBULATORY_CARE_PROVIDER_SITE_OTHER): Payer: PPO | Admitting: Otolaryngology

## 2019-11-14 ENCOUNTER — Other Ambulatory Visit: Payer: Self-pay

## 2019-12-31 ENCOUNTER — Encounter: Payer: Self-pay | Admitting: Internal Medicine

## 2019-12-31 ENCOUNTER — Ambulatory Visit (INDEPENDENT_AMBULATORY_CARE_PROVIDER_SITE_OTHER): Payer: PPO | Admitting: Internal Medicine

## 2019-12-31 ENCOUNTER — Other Ambulatory Visit: Payer: Self-pay

## 2019-12-31 VITALS — BP 173/96 | HR 79 | Temp 97.0°F | Ht 75.0 in | Wt 237.4 lb

## 2019-12-31 DIAGNOSIS — K219 Gastro-esophageal reflux disease without esophagitis: Secondary | ICD-10-CM | POA: Diagnosis not present

## 2019-12-31 DIAGNOSIS — Z86010 Personal history of colonic polyps: Secondary | ICD-10-CM

## 2019-12-31 DIAGNOSIS — L290 Pruritus ani: Secondary | ICD-10-CM | POA: Diagnosis not present

## 2019-12-31 MED ORDER — HYDROCORTISONE (PERIANAL) 2.5 % EX CREA: TOPICAL_CREAM | CUTANEOUS | 1 refills | Status: DC

## 2019-12-31 NOTE — Progress Notes (Signed)
Primary Care Physician:  Sharilyn Sites, MD Primary Gastroenterologist:  Dr. Gala Romney  Pre-Procedure History & Physical: HPI:  [name] Best is a 59 y.o. male here for follow-up of GERD/Barrett's esophagus without dysplagia; history of colonic polyps.  Pt has gained weight since his last visit.  Requires Protonix 40 mg twice daily controls reflux symptoms.  No dysphagia.  Occasional pruritus ani at night;   has to wipe after a bowel movement sometimes a couple of times,  upwards of 2 hours after having a BM.  Procto-med cream worked very well for him previously; due for surveillance EGD and colonoscopy 2022.  Past Medical History:  Diagnosis Date  . Anxiety   . Barrett's esophagus   . C. difficile diarrhea 2014   treated with Flagyl  . Cancer (HCC)    Stage IV squamous cell cancer of throat  . Chronic back pain   . Degenerative disc disease, lumbar   . Diverticulitis    hospitalized in 2003  . Esophageal web    s/p esopahgeal dilation  . GERD (gastroesophageal reflux disease)   . Hx of radiation therapy 01/25/16- 03/15/16   Base of Tongue, bilateral neck  . Hyperlipidemia   . PONV (postoperative nausea and vomiting)     Past Surgical History:  Procedure Laterality Date  . BIOPSY  08/30/2018   Procedure: BIOPSY;  Surgeon: Daneil Dolin, MD;  [facility];  Service: Endoscopy;;  bx's of esophagus  . CHOLECYSTECTOMY    . COLONOSCOPY N/A 07/02/2013   Jenkins:Sessile polyp (tubular adenoma) ranging between 3-10mm in size in the proximal sigmoid colon/mild diverticulosis   . COLONOSCOPY     Dr. Laural Golden: prior to 2003 per patient  . COLONOSCOPY WITH PROPOFOL N/A 08/07/2017   Dr. Gala Romney: multiple 4-13 mm polyps in rectum, sigmoid, descending, transverse, and cecum. adenomas. diverticulosis in sigmoid and descending  . COLONOSCOPY WITH PROPOFOL N/A 08/30/2018   Procedure: COLONOSCOPY WITH PROPOFOL;  Surgeon: Daneil Dolin, MD;  [facility];  Service: Endoscopy;   Laterality: N/A;  7:30am  . DIRECT LARYNGOSCOPY N/A 12/10/2015   Procedure: DIRECT LARYNGOSCOPY;  Surgeon: Leta Baptist, MD;  Location: [facility];  Service: ENT;  Laterality: N/A;  . ESOPHAGOGASTRODUODENOSCOPY N/A 07/02/2013   Jenkins:4 cm hiatal hernia/esophagus appeared normal/chronic gastritis. Clotest negative.  . ESOPHAGOGASTRODUODENOSCOPY (EGD) WITH PROPOFOL N/A 08/30/2018   Procedure: ESOPHAGOGASTRODUODENOSCOPY (EGD) WITH PROPOFOL;  Surgeon: Daneil Dolin, MD;  [facility];  Service: Endoscopy;  Laterality: N/A;  . GASTROSTOMY TUBE CHANGE     placement and removal during chemo/radiation. no longer present  . KNEE ARTHROSCOPY WITH MEDIAL MENISECTOMY Left 04/14/2015   Procedure: KNEE ARTHROSCOPY WITH MEDIAL MENISECTOMY;  Surgeon: Sanjuana Kava, MD;  Location: [facility];  Service: Orthopedics;  Laterality: Left;  . left elbow    . MALONEY DILATION N/A 08/30/2018   Procedure: Venia Minks DILATION;  Surgeon: Daneil Dolin, MD;  [facility];  Service: Endoscopy;  Laterality: N/A;  . MASS BIOPSY Right 12/10/2015   Procedure: RIGHT EXCISIONAL NECK MASS BIOPSY;  Surgeon: Leta Baptist, MD;  Location: [facility];  Service: ENT;  Laterality: Right;  . POLYPECTOMY  08/07/2017   Procedure: POLYPECTOMY;  Surgeon: Daneil Dolin, MD;  [facility];  Service: Endoscopy;;  colon  . POLYPECTOMY  08/30/2018   Procedure: POLYPECTOMY;  Surgeon: Daneil Dolin, MD;  [facility];  Service: Endoscopy;;  polyp at hepatic flexure x2, descending  colon polyps x3  . REMOVAL OF GASTROSTOMY TUBE    . TONGUE BIOPSY Right 12/10/2015   Procedure: TONGUE BIOPSY;  Surgeon: Leta Baptist, MD;  Location: [facility];  Service: ENT;  Laterality: Right;    Prior to Admission medications   Medication Sig Start Date End Date Taking? Authorizing Provider  diazepam (VALIUM) 10 MG tablet Take 1 tablet (10 mg total) by mouth at bedtime as needed for anxiety or  sleep. Reported on 02/16/2016 Patient taking differently: Take 10 mg by mouth at bedtime as needed for sleep. Reported on 02/16/2016 04/18/16  Yes Holley Bouche, NP  levothyroxine (SYNTHROID) 25 MCG tablet Take 1 tablet (25 mcg total) by mouth daily before breakfast. 09/27/19  Yes Gorsuch, Ni, MD  pantoprazole (PROTONIX) 40 MG tablet Take 40 mg by mouth daily.    Yes [provider]  pravastatin (PRAVACHOL) 40 MG tablet Take 40 mg by mouth daily.   Yes [provider]  sodium fluoride (FLUORISHIELD) 1.1 % GEL dental gel Instill one drop of gel per tooth space of tray. Place over teeth for 5 minutes. Remove. Spit out excess. Repeat nightly. Patient taking differently: Place 1 application onto teeth at bedtime. Instill one drop of gel per tooth space of tray. Place over teeth for 5 minutes. Remove. Spit out excess. Repeat nightly. 02/08/16  Yes Eppie Gibson, MD  PROCTO-MED [facility] 2.5 % rectal cream PLACE 1 APPLICATION RECTALLY TWICE DAILY. Patient not taking: No sig reported 12/16/17   Mahala Menghini, PA-C    Allergies as of 12/31/2019 - Review Complete 12/31/2019  Allergen Reaction Noted  . Tetracyclines & related Hives 08/16/2011    Family History  Problem Relation Age of Onset  . Heart failure Mother 55  . Heart attack Father 36  . Heart disease Unknown   . Cancer Paternal Uncle        died of cancer  . Colon cancer Neg Hx   . Colon polyps Neg Hx   . Gastric cancer Neg Hx   . Esophageal cancer Neg Hx     Social History   Socioeconomic History  . Marital status: Married    Spouse name: Not on file  . Number of children: 2  . Years of education: 12th grade  . Highest education level: Not on file  Occupational History  . Occupation: retired  Tobacco Use  . Smoking status: Former Smoker    Types: Cigarettes  . Smokeless tobacco: Never Used  Substance and Sexual Activity  . Alcohol use: No  . Drug use: Yes    Types: Marijuana    Comment: "sometimes for  appetite"  . Sexual activity: Yes    Birth control/protection: None    Comment: married, retired, 1 son & 1 daughter  Other Topics Concern  . Not on file  Social History Narrative   Lives with mother.     Social Determinants of Health   Financial Resource Strain:   . Difficulty of Paying Living Expenses: Not on file  Food Insecurity:   . Worried About Charity fundraiser in the Last Year: Not on file  . Ran Out of Food in the Last Year: Not on file  Transportation Needs:   . Lack of Transportation (Medical): Not on file  . Lack of Transportation (Non-Medical): Not on file  Physical Activity:   . Days of Exercise per Week: Not on file  . Minutes of Exercise per Session: Not on file  Stress:   .  Feeling of Stress : Not on file  Social Connections:   . Frequency of Communication with Friends and Family: Not on file  . Frequency of Social Gatherings with Friends and Family: Not on file  . Attends Religious Services: Not on file  . Active Member of Clubs or Organizations: Not on file  . Attends Archivist Meetings: Not on file  . Marital Status: Not on file  Intimate Partner Violence:   . Fear of Current or Ex-Partner: Not on file  . Emotionally Abused: Not on file  . Physically Abused: Not on file  . Sexually Abused: Not on file    Review of Systems: See HPI, otherwise negative ROS  Physical Exam: BP (!) 173/96   Pulse 79   Temp (!) 97 F (36.1 C) (Oral)   Ht 6\' 3"  (1.905 m)   Wt 237 lb 6.4 oz (107.7 kg)   BMI 29.67 kg/m  General:   Alert,  Well-developed, well-nourished, pleasant and cooperative in NAD  Impression/Plan: 59 year old gentleman longstanding GERD/Barrett's esophagus without dysplasia-doing well on Protonix 40 mg twice daily.  He has gained weight which is undermined our treatment from GERD.  Some hygiene issues likely multifactorial in etiology.  He would benefit from a substantial fiber supplement.  Recommendations:  Procto-med HC 2.5%-  disp 1 unit   Apply a pea-sized amount to anorectum 3x daily - 1 refill  Use Benefiber 1 tablespoon daily x 3 weeks; then increase to twice daily thereafter  Hold the line on weight gain; in fact, plan to loose 20 pounds in next 1 year.  Protonix 40 mg twice daily  Will set up EGD and Colonoscopy in 1 year  OV in 1 year       Notice: This dictation was prepared with Dragon dictation along with smaller phrase technology. Any transcriptional errors that result from this process are unintentional and may not be corrected upon review.

## 2019-12-31 NOTE — Patient Instructions (Addendum)
Procto-med HC 2.5%- disp 1 unit  -  Apply a pea-sized amount to anorectum 3x daily - 1 refill  Use Benefiber 1 tablespoon daily x 3 weeks; then increase to twice daily thereafter  Hold the line on weight gain; in fact, plan to loose 20 pounds in next 1 year.  Protonix 40 mg twice daily  Will set up EGD and Colonoscopy in 1 year  OV in 1 year

## 2020-01-26 DIAGNOSIS — F4542 Pain disorder with related psychological factors: Secondary | ICD-10-CM | POA: Diagnosis not present

## 2020-01-26 DIAGNOSIS — E7849 Other hyperlipidemia: Secondary | ICD-10-CM | POA: Diagnosis not present

## 2020-01-26 DIAGNOSIS — G894 Chronic pain syndrome: Secondary | ICD-10-CM | POA: Diagnosis not present

## 2020-01-27 DIAGNOSIS — F419 Anxiety disorder, unspecified: Secondary | ICD-10-CM | POA: Diagnosis not present

## 2020-02-23 DIAGNOSIS — E7849 Other hyperlipidemia: Secondary | ICD-10-CM | POA: Diagnosis not present

## 2020-02-23 DIAGNOSIS — F4542 Pain disorder with related psychological factors: Secondary | ICD-10-CM | POA: Diagnosis not present

## 2020-02-23 DIAGNOSIS — G894 Chronic pain syndrome: Secondary | ICD-10-CM | POA: Diagnosis not present

## 2020-03-10 DIAGNOSIS — Z1389 Encounter for screening for other disorder: Secondary | ICD-10-CM | POA: Diagnosis not present

## 2020-03-10 DIAGNOSIS — Z0001 Encounter for general adult medical examination with abnormal findings: Secondary | ICD-10-CM | POA: Diagnosis not present

## 2020-03-10 DIAGNOSIS — E7849 Other hyperlipidemia: Secondary | ICD-10-CM | POA: Diagnosis not present

## 2020-03-10 DIAGNOSIS — E785 Hyperlipidemia, unspecified: Secondary | ICD-10-CM | POA: Diagnosis not present

## 2020-03-10 DIAGNOSIS — Z6831 Body mass index (BMI) 31.0-31.9, adult: Secondary | ICD-10-CM | POA: Diagnosis not present

## 2020-03-10 DIAGNOSIS — F419 Anxiety disorder, unspecified: Secondary | ICD-10-CM | POA: Diagnosis not present

## 2020-03-10 DIAGNOSIS — I10 Essential (primary) hypertension: Secondary | ICD-10-CM | POA: Diagnosis not present

## 2020-03-25 DIAGNOSIS — G894 Chronic pain syndrome: Secondary | ICD-10-CM | POA: Diagnosis not present

## 2020-03-25 DIAGNOSIS — E7849 Other hyperlipidemia: Secondary | ICD-10-CM | POA: Diagnosis not present

## 2020-03-25 DIAGNOSIS — F4542 Pain disorder with related psychological factors: Secondary | ICD-10-CM | POA: Diagnosis not present

## 2020-04-22 DIAGNOSIS — F419 Anxiety disorder, unspecified: Secondary | ICD-10-CM | POA: Diagnosis not present

## 2020-04-22 DIAGNOSIS — Z6831 Body mass index (BMI) 31.0-31.9, adult: Secondary | ICD-10-CM | POA: Diagnosis not present

## 2020-04-22 DIAGNOSIS — I10 Essential (primary) hypertension: Secondary | ICD-10-CM | POA: Diagnosis not present

## 2020-04-22 DIAGNOSIS — E6609 Other obesity due to excess calories: Secondary | ICD-10-CM | POA: Diagnosis not present

## 2020-04-24 DIAGNOSIS — F4542 Pain disorder with related psychological factors: Secondary | ICD-10-CM | POA: Diagnosis not present

## 2020-04-24 DIAGNOSIS — G894 Chronic pain syndrome: Secondary | ICD-10-CM | POA: Diagnosis not present

## 2020-04-24 DIAGNOSIS — E7849 Other hyperlipidemia: Secondary | ICD-10-CM | POA: Diagnosis not present

## 2020-05-11 DIAGNOSIS — Z85819 Personal history of malignant neoplasm of unspecified site of lip, oral cavity, and pharynx: Secondary | ICD-10-CM | POA: Diagnosis not present

## 2020-05-25 DIAGNOSIS — E7849 Other hyperlipidemia: Secondary | ICD-10-CM | POA: Diagnosis not present

## 2020-05-25 DIAGNOSIS — F4542 Pain disorder with related psychological factors: Secondary | ICD-10-CM | POA: Diagnosis not present

## 2020-05-25 DIAGNOSIS — G894 Chronic pain syndrome: Secondary | ICD-10-CM | POA: Diagnosis not present

## 2020-06-05 DIAGNOSIS — Z6831 Body mass index (BMI) 31.0-31.9, adult: Secondary | ICD-10-CM | POA: Diagnosis not present

## 2020-06-05 DIAGNOSIS — E6609 Other obesity due to excess calories: Secondary | ICD-10-CM | POA: Diagnosis not present

## 2020-06-05 DIAGNOSIS — L570 Actinic keratosis: Secondary | ICD-10-CM | POA: Diagnosis not present

## 2020-06-22 DIAGNOSIS — D485 Neoplasm of uncertain behavior of skin: Secondary | ICD-10-CM | POA: Diagnosis not present

## 2020-06-22 DIAGNOSIS — D225 Melanocytic nevi of trunk: Secondary | ICD-10-CM | POA: Diagnosis not present

## 2020-06-22 DIAGNOSIS — D2361 Other benign neoplasm of skin of right upper limb, including shoulder: Secondary | ICD-10-CM | POA: Diagnosis not present

## 2020-06-22 DIAGNOSIS — L570 Actinic keratosis: Secondary | ICD-10-CM | POA: Diagnosis not present

## 2020-06-22 DIAGNOSIS — X32XXXA Exposure to sunlight, initial encounter: Secondary | ICD-10-CM | POA: Diagnosis not present

## 2020-06-24 DIAGNOSIS — F4542 Pain disorder with related psychological factors: Secondary | ICD-10-CM | POA: Diagnosis not present

## 2020-06-24 DIAGNOSIS — G894 Chronic pain syndrome: Secondary | ICD-10-CM | POA: Diagnosis not present

## 2020-06-24 DIAGNOSIS — E7849 Other hyperlipidemia: Secondary | ICD-10-CM | POA: Diagnosis not present

## 2020-07-24 DIAGNOSIS — E7849 Other hyperlipidemia: Secondary | ICD-10-CM | POA: Diagnosis not present

## 2020-07-24 DIAGNOSIS — G894 Chronic pain syndrome: Secondary | ICD-10-CM | POA: Diagnosis not present

## 2020-07-24 DIAGNOSIS — F4542 Pain disorder with related psychological factors: Secondary | ICD-10-CM | POA: Diagnosis not present

## 2020-08-25 DIAGNOSIS — F4542 Pain disorder with related psychological factors: Secondary | ICD-10-CM | POA: Diagnosis not present

## 2020-08-25 DIAGNOSIS — G894 Chronic pain syndrome: Secondary | ICD-10-CM | POA: Diagnosis not present

## 2020-08-25 DIAGNOSIS — E7849 Other hyperlipidemia: Secondary | ICD-10-CM | POA: Diagnosis not present

## 2020-09-24 DIAGNOSIS — G894 Chronic pain syndrome: Secondary | ICD-10-CM | POA: Diagnosis not present

## 2020-09-24 DIAGNOSIS — F4542 Pain disorder with related psychological factors: Secondary | ICD-10-CM | POA: Diagnosis not present

## 2020-09-24 DIAGNOSIS — E7849 Other hyperlipidemia: Secondary | ICD-10-CM | POA: Diagnosis not present

## 2020-09-28 ENCOUNTER — Encounter: Payer: Self-pay | Admitting: Hematology and Oncology

## 2020-09-28 ENCOUNTER — Other Ambulatory Visit: Payer: Self-pay

## 2020-09-28 ENCOUNTER — Inpatient Hospital Stay: Payer: PPO | Attending: Hematology and Oncology | Admitting: Hematology and Oncology

## 2020-09-28 DIAGNOSIS — C01 Malignant neoplasm of base of tongue: Secondary | ICD-10-CM | POA: Diagnosis not present

## 2020-09-28 DIAGNOSIS — E039 Hypothyroidism, unspecified: Secondary | ICD-10-CM

## 2020-09-28 DIAGNOSIS — Z9221 Personal history of antineoplastic chemotherapy: Secondary | ICD-10-CM | POA: Insufficient documentation

## 2020-09-28 DIAGNOSIS — Z923 Personal history of irradiation: Secondary | ICD-10-CM | POA: Diagnosis not present

## 2020-09-28 DIAGNOSIS — K117 Disturbances of salivary secretion: Secondary | ICD-10-CM

## 2020-09-28 DIAGNOSIS — Z85810 Personal history of malignant neoplasm of tongue: Secondary | ICD-10-CM | POA: Diagnosis not present

## 2020-09-28 DIAGNOSIS — Y842 Radiological procedure and radiotherapy as the cause of abnormal reaction of the patient, or of later complication, without mention of misadventure at the time of the procedure: Secondary | ICD-10-CM | POA: Diagnosis not present

## 2020-09-28 DIAGNOSIS — Z299 Encounter for prophylactic measures, unspecified: Secondary | ICD-10-CM

## 2020-09-28 DIAGNOSIS — Z79899 Other long term (current) drug therapy: Secondary | ICD-10-CM | POA: Diagnosis not present

## 2020-09-28 DIAGNOSIS — E890 Postprocedural hypothyroidism: Secondary | ICD-10-CM | POA: Diagnosis not present

## 2020-09-28 NOTE — Assessment & Plan Note (Signed)
We discussed the importance of preventive measure with annual influenza vaccination I also recommend the patient to do neck exercises due to chronic lymphedema from radiation treatment and risk of neck fibrosis

## 2020-09-28 NOTE — Progress Notes (Signed)
[place] OFFICE PROGRESS NOTE  Patient Care Team: Sharilyn Sites, MD as PCP - General (Family Medicine) Leta Baptist, MD as Consulting Physician (Otolaryngology) [name] Lark, MD as Consulting Physician (Hematology and Oncology) Eppie Gibson, MD as Attending Physician (Radiation Oncology) Daneil Dolin, MD as Consulting Physician (Gastroenterology)  ASSESSMENT & PLAN:  Cancer of base of tongue ([place]) Clinically, he has no signs or symptoms of disease recurrence In a few more months, the patient would be 5 years cancer free I reinforced the importance of ENT follow-up I recommend future follow-up with primary care doctor only We discussed the importance of staying abstinent from alcohol intake or smoking  Acquired hypothyroidism The patient has acquired hypothyroidism due to radiation exposure He will continue TSH monitoring and medication refill for Synthroid through his primary care doctor office  Xerostomia due to radiotherapy He has chronic dry mouth secondary to radiation exposure He will continue frequent oral fluid intake and close monitoring for poor dentition through his dentist office  Preventive measure We discussed the importance of preventive measure with annual influenza vaccination I also recommend the patient to do neck exercises due to chronic lymphedema from radiation treatment and risk of neck fibrosis   No orders of the defined types were placed in this encounter.   All questions were answered. The patient knows to call the clinic with any problems, questions or concerns. The total time spent in the appointment was 20 minutes encounter with patients including review of chart and various tests results, discussions about plan of care and coordination of care plan   [name] Lark, MD 09/28/2020 10:15 AM  INTERVAL HISTORY: Please see below for problem oriented charting. He returns for further follow-up No new oral lesions He follows with dentist  regularly He continues to have chronic dry mouth No dysphagia Denies limitation of neck movement  SUMMARY OF ONCOLOGIC HISTORY: Oncology History Overview Note  Stage IVA (T1, N2b, M0) squamous cell carcinoma of base of tongue HPV (+)   Cancer of base of tongue ([place])  12/02/2015 Imaging   Ct neck showed right base of tongue cancer and large right neck cervical lymphadenopathy   12/10/2015 Pathology Results   Accession: [id number] right tongue base and right neck mass biopsy were positive for invasive squamous cell cancer, HPV positive   12/10/2015 Procedure   He underwent laryngoscopy and biopsy of base of tongue and excision of right neck mass   01/11/2016 Imaging   PET scan: Hypermetabolic right tongue lesion without evidence of metastatic disease. A necrotic appearing right level-II lymph node, seen on 12/02/2015, is no longer visualized. 2. 1.8 cm low-attenuation lesion in the left lobe of the thyroid, nonspecific   01/22/2016 Procedure   Gastrostomy tube and port-a-cath placed.   01/25/2016 - 03/15/2016 Radiation Therapy   IMRT to BOT/Bilat neck completed [name]). Base of tongue 70 Gy in 35 fractions. High-risk neck nodal echeleons 63 Gy in 35 fractions. Intermediate-risk neck nodal echelons 56 Gy in 35 fractions   01/27/2016 - 01/27/2016 Chemotherapy   He received only 1 dose of cisplatin, complicated by uncontrolled nausea, vomiting, diarrhea and tinnitus. After much discussion, decision was made to discontinue chemotherapy permanently   05/16/2016 Procedure   Gastrostomy tube and port-a-cath removed.   07/07/2016 PET scan   Restaging PET:  1. Resolution of hypermetabolic activity in the RIGHT hypopharynx.  2. No hypermetabolic cervical lymph nodes.  3. No evidence of distant metastatic disease.      REVIEW OF SYSTEMS:  Constitutional: Denies fevers, chills or abnormal weight loss Eyes: Denies blurriness of vision Ears, nose, mouth, throat, and face: Denies mucositis or sore  throat Respiratory: Denies cough, dyspnea or wheezes Cardiovascular: Denies palpitation, chest discomfort or lower extremity swelling Gastrointestinal:  Denies nausea, heartburn or change in bowel habits Skin: Denies abnormal skin rashes Lymphatics: Denies new lymphadenopathy or easy bruising Neurological:Denies numbness, tingling or new weaknesses Behavioral/Psych: Mood is stable, no new changes  All other systems were reviewed with the patient and are negative.  I have reviewed the past medical history, past surgical history, social history and family history with the patient and they are unchanged from previous note.  ALLERGIES:  is allergic to tetracyclines & related.  MEDICATIONS:  Current Outpatient Medications  Medication Sig Dispense Refill  . olmesartan (BENICAR) 20 MG tablet Take 20 mg by mouth daily.    . diazepam (VALIUM) 10 MG tablet Take 1 tablet (10 mg total) by mouth at bedtime as needed for anxiety or sleep. Reported on 02/16/2016 (Patient taking differently: Take 10 mg by mouth at bedtime as needed for sleep. Reported on 02/16/2016) 30 tablet 0  . hydrocortisone (PROCTO-MED HC) 2.5 % rectal cream Apply a pea size amount to anorectum tid daily 30 g 1  . levothyroxine (SYNTHROID) 25 MCG tablet Take 1 tablet (25 mcg total) by mouth daily before breakfast. 30 tablet 3  . pantoprazole (PROTONIX) 40 MG tablet Take 40 mg by mouth daily.     . pravastatin (PRAVACHOL) 40 MG tablet Take 40 mg by mouth daily.    Marland Kitchen PROCTO-MED HC 2.5 % rectal cream PLACE 1 APPLICATION RECTALLY TWICE DAILY. (Patient not taking: No sig reported) 30 g 0  . sodium fluoride (FLUORISHIELD) 1.1 % GEL dental gel Instill one drop of gel per tooth space of tray. Place over teeth for 5 minutes. Remove. Spit out excess. Repeat nightly. (Patient taking differently: Place 1 application onto teeth at bedtime. Instill one drop of gel per tooth space of tray. Place over teeth for 5 minutes. Remove. Spit out excess. Repeat  nightly.) 120 mL prn   No current facility-administered medications for this visit.    PHYSICAL EXAMINATION: ECOG PERFORMANCE STATUS: 1 - Symptomatic but completely ambulatory  Vitals:   09/28/20 0914  BP: (!) 148/97  Pulse: 84  Resp: 17  Temp: 97.8 F (36.6 C)  SpO2: 99%   Filed Weights   09/28/20 0914  Weight: 239 lb 6.4 oz (108.6 kg)    GENERAL:alert, no distress and comfortable SKIN: skin color, texture, turgor are normal, no rashes or significant lesions EYES: normal, Conjunctiva are pink and non-injected, sclera clear OROPHARYNX:no exudate, no erythema and lips, buccal mucosa, and tongue normal  NECK: Noted lymphedema from prior radiation  LYMPH:  no palpable lymphadenopathy in the cervical, axillary or inguinal LUNGS: clear to auscultation and percussion with normal breathing effort HEART: regular rate & rhythm and no murmurs and no lower extremity edema ABDOMEN:abdomen soft, non-tender and normal bowel sounds Musculoskeletal:no cyanosis of digits and no clubbing  NEURO: alert & oriented x 3 with fluent speech, no focal motor/sensory deficits  LABORATORY DATA:  I have reviewed the data as listed    Component Value Date/Time   NA 142 09/27/2019 0900   NA 138 12/01/2016 1403   K 4.0 09/27/2019 0900   K 4.0 12/01/2016 1403   CL 103 09/27/2019 0900   CO2 27 09/27/2019 0900   CO2 30 (H) 12/01/2016 1403   GLUCOSE 112 (H) 09/27/2019 0900  GLUCOSE 94 12/01/2016 1403   BUN 13 09/27/2019 0900   BUN 11.9 12/01/2016 1403   CREATININE 1.39 (H) 09/27/2019 0900   CREATININE 1.4 (H) 12/01/2016 1403   CALCIUM 9.4 09/27/2019 0900   CALCIUM 9.7 12/01/2016 1403   PROT 7.3 09/27/2019 0900   PROT 7.2 12/01/2016 1403   ALBUMIN 3.9 09/27/2019 0900   ALBUMIN 3.5 12/01/2016 1403   AST 14 (L) 09/27/2019 0900   AST 14 12/01/2016 1403   ALT 12 09/27/2019 0900   ALT 9 12/01/2016 1403   ALKPHOS 107 09/27/2019 0900   ALKPHOS 96 12/01/2016 1403   BILITOT 0.5 09/27/2019 0900    BILITOT 0.36 12/01/2016 1403   GFRNONAA 55 (L) 09/27/2019 0900   GFRAA >60 09/27/2019 0900    No results found for: SPEP, UPEP  Lab Results  Component Value Date   WBC 6.9 09/27/2019   NEUTROABS 5.4 09/27/2019   HGB 15.3 09/27/2019   HCT 47.6 09/27/2019   MCV 88.1 09/27/2019   PLT 217 09/27/2019      Chemistry      Component Value Date/Time   NA 142 09/27/2019 0900   NA 138 12/01/2016 1403   K 4.0 09/27/2019 0900   K 4.0 12/01/2016 1403   CL 103 09/27/2019 0900   CO2 27 09/27/2019 0900   CO2 30 (H) 12/01/2016 1403   BUN 13 09/27/2019 0900   BUN 11.9 12/01/2016 1403   CREATININE 1.39 (H) 09/27/2019 0900   CREATININE 1.4 (H) 12/01/2016 1403      Component Value Date/Time   CALCIUM 9.4 09/27/2019 0900   CALCIUM 9.7 12/01/2016 1403   ALKPHOS 107 09/27/2019 0900   ALKPHOS 96 12/01/2016 1403   AST 14 (L) 09/27/2019 0900   AST 14 12/01/2016 1403   ALT 12 09/27/2019 0900   ALT 9 12/01/2016 1403   BILITOT 0.5 09/27/2019 0900   BILITOT 0.36 12/01/2016 1403

## 2020-09-28 NOTE — Assessment & Plan Note (Signed)
Clinically, he has no signs or symptoms of disease recurrence In a few more months, the patient would be 5 years cancer free I reinforced the importance of ENT follow-up I recommend future follow-up with primary care doctor only We discussed the importance of staying abstinent from alcohol intake or smoking

## 2020-09-28 NOTE — Assessment & Plan Note (Signed)
The patient has acquired hypothyroidism due to radiation exposure He will continue TSH monitoring and medication refill for Synthroid through his primary care doctor office

## 2020-09-28 NOTE — Assessment & Plan Note (Signed)
He has chronic dry mouth secondary to radiation exposure He will continue frequent oral fluid intake and close monitoring for poor dentition through his dentist office

## 2020-09-29 ENCOUNTER — Telehealth: Payer: Self-pay | Admitting: Hematology and Oncology

## 2020-09-29 DIAGNOSIS — Z6831 Body mass index (BMI) 31.0-31.9, adult: Secondary | ICD-10-CM | POA: Diagnosis not present

## 2020-09-29 DIAGNOSIS — F419 Anxiety disorder, unspecified: Secondary | ICD-10-CM | POA: Diagnosis not present

## 2020-09-29 DIAGNOSIS — E6609 Other obesity due to excess calories: Secondary | ICD-10-CM | POA: Diagnosis not present

## 2020-09-29 NOTE — Telephone Encounter (Signed)
Per 10/4 los, no changes made to pt schedule

## 2020-10-24 DIAGNOSIS — F4542 Pain disorder with related psychological factors: Secondary | ICD-10-CM | POA: Diagnosis not present

## 2020-10-24 DIAGNOSIS — E7849 Other hyperlipidemia: Secondary | ICD-10-CM | POA: Diagnosis not present

## 2020-10-24 DIAGNOSIS — G894 Chronic pain syndrome: Secondary | ICD-10-CM | POA: Diagnosis not present

## 2020-11-09 DIAGNOSIS — Z85819 Personal history of malignant neoplasm of unspecified site of lip, oral cavity, and pharynx: Secondary | ICD-10-CM | POA: Diagnosis not present

## 2020-11-24 DIAGNOSIS — F4542 Pain disorder with related psychological factors: Secondary | ICD-10-CM | POA: Diagnosis not present

## 2020-11-24 DIAGNOSIS — G894 Chronic pain syndrome: Secondary | ICD-10-CM | POA: Diagnosis not present

## 2020-11-24 DIAGNOSIS — E7849 Other hyperlipidemia: Secondary | ICD-10-CM | POA: Diagnosis not present

## 2020-11-25 DIAGNOSIS — Z6831 Body mass index (BMI) 31.0-31.9, adult: Secondary | ICD-10-CM | POA: Diagnosis not present

## 2020-11-25 DIAGNOSIS — E6609 Other obesity due to excess calories: Secondary | ICD-10-CM | POA: Diagnosis not present

## 2020-11-25 DIAGNOSIS — E785 Hyperlipidemia, unspecified: Secondary | ICD-10-CM | POA: Diagnosis not present

## 2020-11-25 DIAGNOSIS — I10 Essential (primary) hypertension: Secondary | ICD-10-CM | POA: Diagnosis not present

## 2020-11-25 DIAGNOSIS — E7849 Other hyperlipidemia: Secondary | ICD-10-CM | POA: Diagnosis not present

## 2020-11-25 DIAGNOSIS — F419 Anxiety disorder, unspecified: Secondary | ICD-10-CM | POA: Diagnosis not present

## 2020-12-25 DIAGNOSIS — G894 Chronic pain syndrome: Secondary | ICD-10-CM | POA: Diagnosis not present

## 2020-12-25 DIAGNOSIS — F4542 Pain disorder with related psychological factors: Secondary | ICD-10-CM | POA: Diagnosis not present

## 2020-12-25 DIAGNOSIS — E7849 Other hyperlipidemia: Secondary | ICD-10-CM | POA: Diagnosis not present

## 2021-01-23 DIAGNOSIS — E7849 Other hyperlipidemia: Secondary | ICD-10-CM | POA: Diagnosis not present

## 2021-01-23 DIAGNOSIS — G894 Chronic pain syndrome: Secondary | ICD-10-CM | POA: Diagnosis not present

## 2021-01-23 DIAGNOSIS — F4542 Pain disorder with related psychological factors: Secondary | ICD-10-CM | POA: Diagnosis not present

## 2021-03-02 DIAGNOSIS — E6609 Other obesity due to excess calories: Secondary | ICD-10-CM | POA: Diagnosis not present

## 2021-03-02 DIAGNOSIS — Z6831 Body mass index (BMI) 31.0-31.9, adult: Secondary | ICD-10-CM | POA: Diagnosis not present

## 2021-03-02 DIAGNOSIS — Z0000 Encounter for general adult medical examination without abnormal findings: Secondary | ICD-10-CM | POA: Diagnosis not present

## 2021-03-02 DIAGNOSIS — Z1331 Encounter for screening for depression: Secondary | ICD-10-CM | POA: Diagnosis not present

## 2021-03-02 DIAGNOSIS — F419 Anxiety disorder, unspecified: Secondary | ICD-10-CM | POA: Diagnosis not present

## 2021-03-24 DIAGNOSIS — I10 Essential (primary) hypertension: Secondary | ICD-10-CM | POA: Diagnosis not present

## 2021-03-24 DIAGNOSIS — E7849 Other hyperlipidemia: Secondary | ICD-10-CM | POA: Diagnosis not present

## 2021-04-19 DIAGNOSIS — Z6832 Body mass index (BMI) 32.0-32.9, adult: Secondary | ICD-10-CM | POA: Diagnosis not present

## 2021-04-19 DIAGNOSIS — R972 Elevated prostate specific antigen [PSA]: Secondary | ICD-10-CM | POA: Diagnosis not present

## 2021-04-19 DIAGNOSIS — E6609 Other obesity due to excess calories: Secondary | ICD-10-CM | POA: Diagnosis not present

## 2021-04-19 DIAGNOSIS — K4190 Unilateral femoral hernia, without obstruction or gangrene, not specified as recurrent: Secondary | ICD-10-CM | POA: Diagnosis not present

## 2021-05-10 DIAGNOSIS — Z85819 Personal history of malignant neoplasm of unspecified site of lip, oral cavity, and pharynx: Secondary | ICD-10-CM | POA: Diagnosis not present

## 2021-06-07 ENCOUNTER — Other Ambulatory Visit (HOSPITAL_COMMUNITY): Payer: Self-pay | Admitting: Internal Medicine

## 2021-06-07 ENCOUNTER — Ambulatory Visit (HOSPITAL_COMMUNITY)
Admission: RE | Admit: 2021-06-07 | Discharge: 2021-06-07 | Disposition: A | Discharge status: Home / Self Care | Payer: PPO | Source: Ambulatory Visit | Attending: Internal Medicine | Admitting: Internal Medicine

## 2021-06-07 DIAGNOSIS — Z6831 Body mass index (BMI) 31.0-31.9, adult: Secondary | ICD-10-CM | POA: Diagnosis not present

## 2021-06-07 DIAGNOSIS — F419 Anxiety disorder, unspecified: Secondary | ICD-10-CM | POA: Diagnosis not present

## 2021-06-07 DIAGNOSIS — E6609 Other obesity due to excess calories: Secondary | ICD-10-CM | POA: Diagnosis not present

## 2021-06-07 DIAGNOSIS — M25552 Pain in left hip: Secondary | ICD-10-CM | POA: Diagnosis not present

## 2021-06-07 DIAGNOSIS — C020 Malignant neoplasm of dorsal surface of tongue: Secondary | ICD-10-CM | POA: Diagnosis not present

## 2021-07-25 DIAGNOSIS — E782 Mixed hyperlipidemia: Secondary | ICD-10-CM | POA: Diagnosis not present

## 2021-07-25 DIAGNOSIS — I10 Essential (primary) hypertension: Secondary | ICD-10-CM | POA: Diagnosis not present

## 2021-08-03 ENCOUNTER — Encounter: Payer: Self-pay | Admitting: *Deleted

## 2021-08-25 DIAGNOSIS — I10 Essential (primary) hypertension: Secondary | ICD-10-CM | POA: Diagnosis not present

## 2021-08-25 DIAGNOSIS — E7849 Other hyperlipidemia: Secondary | ICD-10-CM | POA: Diagnosis not present

## 2021-09-13 DIAGNOSIS — M5136 Other intervertebral disc degeneration, lumbar region: Secondary | ICD-10-CM | POA: Diagnosis not present

## 2021-09-13 DIAGNOSIS — Z6831 Body mass index (BMI) 31.0-31.9, adult: Secondary | ICD-10-CM | POA: Diagnosis not present

## 2021-09-13 DIAGNOSIS — F419 Anxiety disorder, unspecified: Secondary | ICD-10-CM | POA: Diagnosis not present

## 2021-09-13 DIAGNOSIS — E039 Hypothyroidism, unspecified: Secondary | ICD-10-CM | POA: Diagnosis not present

## 2021-09-13 DIAGNOSIS — E7849 Other hyperlipidemia: Secondary | ICD-10-CM | POA: Diagnosis not present

## 2021-09-13 DIAGNOSIS — E6609 Other obesity due to excess calories: Secondary | ICD-10-CM | POA: Diagnosis not present

## 2021-09-13 DIAGNOSIS — E782 Mixed hyperlipidemia: Secondary | ICD-10-CM | POA: Diagnosis not present

## 2021-09-13 DIAGNOSIS — G894 Chronic pain syndrome: Secondary | ICD-10-CM | POA: Diagnosis not present

## 2021-09-13 DIAGNOSIS — I10 Essential (primary) hypertension: Secondary | ICD-10-CM | POA: Diagnosis not present

## 2021-09-24 DIAGNOSIS — E782 Mixed hyperlipidemia: Secondary | ICD-10-CM | POA: Diagnosis not present

## 2021-09-24 DIAGNOSIS — I10 Essential (primary) hypertension: Secondary | ICD-10-CM | POA: Diagnosis not present

## 2021-11-24 DIAGNOSIS — I10 Essential (primary) hypertension: Secondary | ICD-10-CM | POA: Diagnosis not present

## 2021-11-24 DIAGNOSIS — E782 Mixed hyperlipidemia: Secondary | ICD-10-CM | POA: Diagnosis not present

## 2021-11-29 ENCOUNTER — Encounter: Payer: Self-pay | Admitting: Internal Medicine

## 2021-12-07 DIAGNOSIS — Z6831 Body mass index (BMI) 31.0-31.9, adult: Secondary | ICD-10-CM | POA: Diagnosis not present

## 2021-12-07 DIAGNOSIS — F419 Anxiety disorder, unspecified: Secondary | ICD-10-CM | POA: Diagnosis not present

## 2021-12-07 DIAGNOSIS — E039 Hypothyroidism, unspecified: Secondary | ICD-10-CM | POA: Diagnosis not present

## 2021-12-07 DIAGNOSIS — E669 Obesity, unspecified: Secondary | ICD-10-CM | POA: Diagnosis not present

## 2021-12-24 DIAGNOSIS — E782 Mixed hyperlipidemia: Secondary | ICD-10-CM | POA: Diagnosis not present

## 2021-12-24 DIAGNOSIS — I10 Essential (primary) hypertension: Secondary | ICD-10-CM | POA: Diagnosis not present

## 2022-03-25 DIAGNOSIS — E782 Mixed hyperlipidemia: Secondary | ICD-10-CM | POA: Diagnosis not present

## 2022-03-25 DIAGNOSIS — I10 Essential (primary) hypertension: Secondary | ICD-10-CM | POA: Diagnosis not present

## 2022-03-28 ENCOUNTER — Encounter: Payer: Self-pay | Admitting: Gastroenterology

## 2022-03-28 NOTE — Progress Notes (Signed)
? ? ?Referring Provider: Sharilyn Sites, MD ?Primary Care Physician:  Sharilyn Sites, MD ?Primary GI Physician: Dr. Gala Romney ? ?Chief Complaint  ?Patient presents with  ? Colonoscopy  ? ? ?HPI:   ?[name] Best is a 61 y.o. male presenting today with a history of GERD, Barrett's esophagus, multiple adenomatous colon polyps, currently overdue for surveillance EGD and TCS, presenting today for follow-up and to discuss scheduling procedures. Last seen in our office in January 2021 and was doing well at that time with PPI BID. He did have some rectal hygiene issues and was prescribed Procto-med and recommended benefiber daily.  ? ?Today:  ?Doing well overall. Having dental surgery in May.  States all of his teeth are falling out since he had chemo and radiation.  Prefers to have procedures in June. ? ?Reports intermittent rectal itching when he eats dairy.  This responds very well to ProctoCream.  States he also has a little bit of a looser stool when he has dairy and feels that he has a hemorrhoid as he does notice a little bump that goes away when he uses the ProctoCream.  After a bowel movement, he also has to go back a couple of hours later to wipe again.  No trouble with constipation.  Usually 1 BM daily. Taking fiber daily.  ? ? ?Intermittent sulfur burps. Depends on what he eats. More so with dairy products. 1-2 times a week. Reflux symptoms once every 2 week, triggered by spicy foods. No dysphagia. No nausea or vomiting. Weight is stable.  ? ? ? ?Last EGD September 2019 with web at the cricopharyngeus s/p dilation, abnormal distal esophagus consistent with Barrett's esophagus without dysplasia, normal stomach, normal examined duodenum.  Recommended surveillance in 3 years. ? ?Last colonoscopy September 2019 with multiple diverticula in the sigmoid and descending colon, Four 4-6 mm polyps resected and retrieved, one 8 mm polyp resected and retrieved.  Pathology with tubular adenomas.  Recommended surveillance in 3  years. ? ? ?Past Medical History:  ?Diagnosis Date  ? Anxiety   ? Barrett's esophagus   ? C. difficile diarrhea 2014  ? treated with Flagyl  ? Cancer [facility])   ? Stage IV squamous cell cancer of throat  ? Chronic back pain   ? Degenerative disc disease, lumbar   ? Diverticulitis   ? hospitalized in 2003  ? Esophageal web   ? s/p esopahgeal dilation  ? GERD (gastroesophageal reflux disease)   ? HTN (hypertension)   ? Hx of radiation therapy 01/25/16- 03/15/16  ? Base of Tongue, bilateral neck  ? Hyperlipidemia   ? Hypothyroidism   ? PONV (postoperative nausea and vomiting)   ? ? ?Past Surgical History:  ?Procedure Laterality Date  ? BIOPSY  08/30/2018  ? Procedure: BIOPSY;  Surgeon: Daneil Dolin, MD;  [facility];  Service: Endoscopy;;  bx's of esophagus  ? CHOLECYSTECTOMY    ? COLONOSCOPY N/A 07/02/2013  ? Jenkins:Sessile polyp (tubular adenoma) ranging between 3-61m in size in the proximal sigmoid colon/mild diverticulosis   ? COLONOSCOPY    ? Dr. RLaural Golden prior to 2003 per patient  ? COLONOSCOPY WITH PROPOFOL N/A 08/07/2017  ? Dr. RGala Romney multiple 4-13 mm polyps in rectum, sigmoid, descending, transverse, and cecum. adenomas. diverticulosis in sigmoid and descending  ? COLONOSCOPY WITH PROPOFOL N/A 08/30/2018  ? Surgeon: RDaneil Dolin MD;  multiple diverticula in the sigmoid and descending colon, Four 4-6 mm polyps resected and retrieved, one 8 mm polyp resected and retrieved.  Pathology with tubular adenomas.  Recommended surveillance in 3 years.  ? DIRECT LARYNGOSCOPY N/A 12/10/2015  ? Procedure: DIRECT LARYNGOSCOPY;  Surgeon: Leta Baptist, MD;  Location: [place];  Service: ENT;  Laterality: N/A;  ? ESOPHAGOGASTRODUODENOSCOPY N/A 07/02/2013  ? Jenkins:4 cm hiatal hernia/esophagus appeared normal/chronic gastritis. Clotest negative.  ? ESOPHAGOGASTRODUODENOSCOPY (EGD) WITH PROPOFOL N/A 08/30/2018  ? Surgeon: Daneil Dolin, MD; web at the cricopharyngeus s/p dilation, abnormal distal  esophagus consistent with Barrett's esophagus without dysplasia, normal stomach, normal examined duodenum.  Recommended surveillance in 3 years.  ? GASTROSTOMY TUBE CHANGE    ? placement and removal during chemo/radiation. no longer present  ? KNEE ARTHROSCOPY WITH MEDIAL MENISECTOMY Left 04/14/2015  ? Procedure: KNEE ARTHROSCOPY WITH MEDIAL MENISECTOMY;  Surgeon: Sanjuana Kava, MD;  Location: [facility];  Service: Orthopedics;  Laterality: Left;  ? left elbow    ? MALONEY DILATION N/A 08/30/2018  ? Procedure: MALONEY DILATION;  Surgeon: Daneil Dolin, MD;  [facility];  Service: Endoscopy;  Laterality: N/A;  ? MASS BIOPSY Right 12/10/2015  ? Procedure: RIGHT EXCISIONAL NECK MASS BIOPSY;  Surgeon: Leta Baptist, MD;  Location: [place];  Service: ENT;  Laterality: Right;  ? POLYPECTOMY  08/07/2017  ? Procedure: POLYPECTOMY;  Surgeon: Daneil Dolin, MD;  [facility];  Service: Endoscopy;;  colon  ? POLYPECTOMY  08/30/2018  ? Procedure: POLYPECTOMY;  Surgeon: Daneil Dolin, MD;  [facility];  Service: Endoscopy;;  polyp at hepatic flexure x2, descending colon polyps x3  ? REMOVAL OF GASTROSTOMY TUBE    ? TONGUE BIOPSY Right 12/10/2015  ? Procedure: TONGUE BIOPSY;  Surgeon: Leta Baptist, MD;  Location: [place];  Service: ENT;  Laterality: Right;  ? ? ?Current Outpatient Medications  ?Medication Sig Dispense Refill  ? Ascorbic Acid (VITAMIN C) 1000 MG tablet Take 1,000 mg by mouth daily.    ? aspirin 81 MG chewable tablet Chew by mouth daily.    ? Cholecalciferol (VITAMIN D3) 1.25 MG (50000 UT) CAPS Take by mouth.    ? diazepam (VALIUM) 10 MG tablet Take 1 tablet (10 mg total) by mouth at bedtime as needed for anxiety or sleep. Reported on 02/16/2016 (Patient taking differently: Take 10 mg by mouth at bedtime as needed for sleep. Reported on 02/16/2016) 30 tablet 0  ? hydrocortisone (PROCTO-MED HC) 2.5 % rectal cream Place 1 application. rectally 2 (two) times  daily. 30 g 1  ? levothyroxine (SYNTHROID) 25 MCG tablet Take 1 tablet (25 mcg total) by mouth daily before breakfast. 30 tablet 3  ? olmesartan (BENICAR) 20 MG tablet Take 20 mg by mouth daily.    ? pantoprazole (PROTONIX) 40 MG tablet Take 40 mg by mouth daily.     ? pravastatin (PRAVACHOL) 40 MG tablet Take 40 mg by mouth daily.    ? zinc gluconate 50 MG tablet Take 50 mg by mouth daily.    ? ?No current facility-administered medications for this visit.  ? ? ?Allergies as of 03/30/2022 - Review Complete 03/30/2022  ?Allergen Reaction Noted  ? Tetracyclines & related Hives 08/16/2011  ? ? ?Family History  ?Problem Relation Age of Onset  ? Heart failure Mother 68  ? Heart attack Father 69  ? Heart disease Unknown   ? Cancer Paternal Uncle   ?     died of cancer  ? Colon cancer Neg Hx   ? Colon polyps Neg Hx   ?  Gastric cancer Neg Hx   ? Esophageal cancer Neg Hx   ? ? ?Social History  ? ?Socioeconomic History  ? Marital status: Married  ?  Spouse name: Not on file  ? Number of children: 2  ? Years of education: 12th grade  ? Highest education level: Not on file  ?Occupational History  ? Occupation: retired  ?Tobacco Use  ? Smoking status: Former  ?  Types: Cigarettes  ? Smokeless tobacco: Never  ?Vaping Use  ? Vaping Use: Never used  ?Substance and Sexual Activity  ? Alcohol use: No  ? Drug use: Yes  ?  Types: Marijuana  ?  Comment: "sometimes for appetite"  ? Sexual activity: Yes  ?  Birth control/protection: None  ?  Comment: married, retired, 1 son & 1 daughter  ?Other Topics Concern  ? Not on file  ?Social History Narrative  ? Lives with mother.    ? ?Social Determinants of Health  ? ?Financial Resource Strain: Not on file  ?Food Insecurity: Not on file  ?Transportation Needs: Not on file  ?Physical Activity: Not on file  ?Stress: Not on file  ?Social Connections: Not on file  ? ? ?Review of Systems: ?Gen: Denies fever, chills, cold or flulike symptoms, presyncope, syncope. ?CV: Denies chest pain,  palpitations. ?Resp: Denies dyspnea or cough. ?GI: See HPI ?Heme: See HPI ? ?Physical Exam: ?BP (!) 146/89   Pulse 70   Temp (!) 97.5 ?F (36.4 ?C) (Temporal)   Ht '6\' 3"'$  (1.905 m)   Wt 230 lb (104.3 kg)   BMI 28.75 kg/

## 2022-03-30 ENCOUNTER — Ambulatory Visit: Payer: PPO | Admitting: Gastroenterology

## 2022-03-30 ENCOUNTER — Encounter: Payer: Self-pay | Admitting: Gastroenterology

## 2022-03-30 VITALS — BP 146/89 | HR 70 | Temp 97.5°F | Ht 75.0 in | Wt 230.0 lb

## 2022-03-30 DIAGNOSIS — K2270 Barrett's esophagus without dysplasia: Secondary | ICD-10-CM | POA: Diagnosis not present

## 2022-03-30 DIAGNOSIS — Z86010 Personal history of colonic polyps: Secondary | ICD-10-CM | POA: Diagnosis not present

## 2022-03-30 DIAGNOSIS — L290 Pruritus ani: Secondary | ICD-10-CM | POA: Insufficient documentation

## 2022-03-30 DIAGNOSIS — K219 Gastro-esophageal reflux disease without esophagitis: Secondary | ICD-10-CM | POA: Diagnosis not present

## 2022-03-30 MED ORDER — HYDROCORTISONE (PERIANAL) 2.5 % EX CREA: 1.0000 "application " | TOPICAL_CREAM | Freq: Two times a day (BID) | CUTANEOUS | 1 refills | Status: AC

## 2022-03-30 NOTE — Patient Instructions (Addendum)
We will arrange for you to have an upper endoscopy and colonoscopy in June with Dr. Gala Romney. ? ?Continue Protonix 40 mg daily 30 minutes before breakfast. ? ?Follow a GERD diet:  ?Avoid fried, fatty, greasy, spicy, citrus foods. ?Avoid caffeine and carbonated beverages. ?Avoid chocolate. ?Try eating 4-6 small meals a day rather than 3 large meals. ?Do not eat within 3 hours of laying down. ?Prop head of bed up on wood or bricks to create a 6 inch incline. ? ?Continue to use proctoceam as needed for anorectal itching. ? ?Continue fiber supplement daily. ? ?We will follow-up with you in 1 year.  Do not hesitate to call if you have any questions or concerns prior to your next visit. ? ?It was very nice to meet you today! Have a Happy Easter!  ? ?Aliene Altes, PA-C ?[facility] ? ?

## 2022-04-28 ENCOUNTER — Telehealth: Payer: Self-pay | Admitting: *Deleted

## 2022-04-28 NOTE — Telephone Encounter (Signed)
Called pt and LMTCB with mother to schedule TCS/EGD with Dr. Gala Romney, asa 3 in June ?

## 2022-06-03 DIAGNOSIS — I10 Essential (primary) hypertension: Secondary | ICD-10-CM | POA: Diagnosis not present

## 2022-06-03 DIAGNOSIS — E039 Hypothyroidism, unspecified: Secondary | ICD-10-CM | POA: Diagnosis not present

## 2022-06-03 DIAGNOSIS — E6609 Other obesity due to excess calories: Secondary | ICD-10-CM | POA: Diagnosis not present

## 2022-06-03 DIAGNOSIS — Z6830 Body mass index (BMI) 30.0-30.9, adult: Secondary | ICD-10-CM | POA: Diagnosis not present

## 2022-06-03 DIAGNOSIS — F419 Anxiety disorder, unspecified: Secondary | ICD-10-CM | POA: Diagnosis not present

## 2022-06-21 DIAGNOSIS — Z0001 Encounter for general adult medical examination with abnormal findings: Secondary | ICD-10-CM | POA: Diagnosis not present

## 2022-06-21 DIAGNOSIS — Z1331 Encounter for screening for depression: Secondary | ICD-10-CM | POA: Diagnosis not present

## 2022-06-21 DIAGNOSIS — E063 Autoimmune thyroiditis: Secondary | ICD-10-CM | POA: Diagnosis not present

## 2022-06-21 DIAGNOSIS — Z6829 Body mass index (BMI) 29.0-29.9, adult: Secondary | ICD-10-CM | POA: Diagnosis not present

## 2022-06-21 DIAGNOSIS — C029 Malignant neoplasm of tongue, unspecified: Secondary | ICD-10-CM | POA: Diagnosis not present

## 2022-06-21 DIAGNOSIS — I10 Essential (primary) hypertension: Secondary | ICD-10-CM | POA: Diagnosis not present

## 2022-06-21 DIAGNOSIS — Z125 Encounter for screening for malignant neoplasm of prostate: Secondary | ICD-10-CM | POA: Diagnosis not present

## 2022-06-21 DIAGNOSIS — E663 Overweight: Secondary | ICD-10-CM | POA: Diagnosis not present

## 2022-06-21 DIAGNOSIS — E782 Mixed hyperlipidemia: Secondary | ICD-10-CM | POA: Diagnosis not present

## 2022-06-21 DIAGNOSIS — E039 Hypothyroidism, unspecified: Secondary | ICD-10-CM | POA: Diagnosis not present

## 2022-06-21 DIAGNOSIS — I7779 Dissection of other artery: Secondary | ICD-10-CM | POA: Diagnosis not present

## 2022-06-21 DIAGNOSIS — F320 Major depressive disorder, single episode, mild: Secondary | ICD-10-CM | POA: Diagnosis not present

## 2022-06-21 DIAGNOSIS — D518 Other vitamin B12 deficiency anemias: Secondary | ICD-10-CM | POA: Diagnosis not present

## 2022-06-21 DIAGNOSIS — M5136 Other intervertebral disc degeneration, lumbar region: Secondary | ICD-10-CM | POA: Diagnosis not present

## 2022-06-21 DIAGNOSIS — E559 Vitamin D deficiency, unspecified: Secondary | ICD-10-CM | POA: Diagnosis not present

## 2022-08-15 DIAGNOSIS — I7779 Dissection of other artery: Secondary | ICD-10-CM | POA: Diagnosis not present

## 2022-08-15 DIAGNOSIS — C029 Malignant neoplasm of tongue, unspecified: Secondary | ICD-10-CM | POA: Diagnosis not present

## 2022-08-15 DIAGNOSIS — M5416 Radiculopathy, lumbar region: Secondary | ICD-10-CM | POA: Diagnosis not present

## 2022-08-15 DIAGNOSIS — Z6829 Body mass index (BMI) 29.0-29.9, adult: Secondary | ICD-10-CM | POA: Diagnosis not present

## 2022-08-15 DIAGNOSIS — E063 Autoimmune thyroiditis: Secondary | ICD-10-CM | POA: Diagnosis not present

## 2022-08-15 DIAGNOSIS — I10 Essential (primary) hypertension: Secondary | ICD-10-CM | POA: Diagnosis not present

## 2022-08-15 DIAGNOSIS — E663 Overweight: Secondary | ICD-10-CM | POA: Diagnosis not present

## 2022-10-11 DIAGNOSIS — Z6829 Body mass index (BMI) 29.0-29.9, adult: Secondary | ICD-10-CM | POA: Diagnosis not present

## 2022-10-11 DIAGNOSIS — I10 Essential (primary) hypertension: Secondary | ICD-10-CM | POA: Diagnosis not present

## 2022-10-11 DIAGNOSIS — M5416 Radiculopathy, lumbar region: Secondary | ICD-10-CM | POA: Diagnosis not present

## 2022-10-11 DIAGNOSIS — E663 Overweight: Secondary | ICD-10-CM | POA: Diagnosis not present

## 2022-10-11 DIAGNOSIS — E063 Autoimmune thyroiditis: Secondary | ICD-10-CM | POA: Diagnosis not present

## 2022-10-11 DIAGNOSIS — M5136 Other intervertebral disc degeneration, lumbar region: Secondary | ICD-10-CM | POA: Diagnosis not present

## 2022-10-11 DIAGNOSIS — K5732 Diverticulitis of large intestine without perforation or abscess without bleeding: Secondary | ICD-10-CM | POA: Diagnosis not present

## 2022-12-01 DIAGNOSIS — F419 Anxiety disorder, unspecified: Secondary | ICD-10-CM | POA: Diagnosis not present

## 2022-12-01 DIAGNOSIS — Z6829 Body mass index (BMI) 29.0-29.9, adult: Secondary | ICD-10-CM | POA: Diagnosis not present

## 2022-12-01 DIAGNOSIS — E663 Overweight: Secondary | ICD-10-CM | POA: Diagnosis not present

## 2023-01-17 DIAGNOSIS — M5416 Radiculopathy, lumbar region: Secondary | ICD-10-CM | POA: Diagnosis not present

## 2023-01-17 DIAGNOSIS — Z6830 Body mass index (BMI) 30.0-30.9, adult: Secondary | ICD-10-CM | POA: Diagnosis not present

## 2023-01-17 DIAGNOSIS — E6609 Other obesity due to excess calories: Secondary | ICD-10-CM | POA: Diagnosis not present

## 2023-01-17 DIAGNOSIS — M5136 Other intervertebral disc degeneration, lumbar region: Secondary | ICD-10-CM | POA: Diagnosis not present

## 2023-01-25 DIAGNOSIS — M5136 Other intervertebral disc degeneration, lumbar region: Secondary | ICD-10-CM | POA: Diagnosis not present

## 2023-01-25 DIAGNOSIS — Z6831 Body mass index (BMI) 31.0-31.9, adult: Secondary | ICD-10-CM | POA: Diagnosis not present

## 2023-01-25 DIAGNOSIS — M5416 Radiculopathy, lumbar region: Secondary | ICD-10-CM | POA: Diagnosis not present

## 2023-01-25 DIAGNOSIS — E6609 Other obesity due to excess calories: Secondary | ICD-10-CM | POA: Diagnosis not present

## 2023-02-08 DIAGNOSIS — M5136 Other intervertebral disc degeneration, lumbar region: Secondary | ICD-10-CM | POA: Diagnosis not present

## 2023-02-08 DIAGNOSIS — M5416 Radiculopathy, lumbar region: Secondary | ICD-10-CM | POA: Diagnosis not present

## 2023-02-08 DIAGNOSIS — C029 Malignant neoplasm of tongue, unspecified: Secondary | ICD-10-CM | POA: Diagnosis not present

## 2023-02-08 DIAGNOSIS — G894 Chronic pain syndrome: Secondary | ICD-10-CM | POA: Diagnosis not present

## 2023-02-08 DIAGNOSIS — Z6830 Body mass index (BMI) 30.0-30.9, adult: Secondary | ICD-10-CM | POA: Diagnosis not present

## 2023-02-08 DIAGNOSIS — I7779 Dissection of other artery: Secondary | ICD-10-CM | POA: Diagnosis not present

## 2023-02-08 DIAGNOSIS — E6609 Other obesity due to excess calories: Secondary | ICD-10-CM | POA: Diagnosis not present

## 2023-02-13 ENCOUNTER — Other Ambulatory Visit (HOSPITAL_COMMUNITY): Payer: Self-pay | Admitting: Internal Medicine

## 2023-02-13 DIAGNOSIS — M5416 Radiculopathy, lumbar region: Secondary | ICD-10-CM

## 2023-02-15 ENCOUNTER — Ambulatory Visit (HOSPITAL_COMMUNITY)
Admission: RE | Admit: 2023-02-15 | Discharge: 2023-02-15 | Disposition: A | Discharge status: Home / Self Care | Payer: HMO | Source: Ambulatory Visit | Attending: Internal Medicine | Admitting: Internal Medicine

## 2023-02-15 DIAGNOSIS — M5416 Radiculopathy, lumbar region: Secondary | ICD-10-CM | POA: Diagnosis not present

## 2023-02-15 DIAGNOSIS — M5126 Other intervertebral disc displacement, lumbar region: Secondary | ICD-10-CM | POA: Diagnosis not present

## 2023-02-16 DIAGNOSIS — M25552 Pain in left hip: Secondary | ICD-10-CM | POA: Diagnosis not present

## 2023-02-16 DIAGNOSIS — M5126 Other intervertebral disc displacement, lumbar region: Secondary | ICD-10-CM | POA: Diagnosis not present

## 2023-02-22 DIAGNOSIS — M5416 Radiculopathy, lumbar region: Secondary | ICD-10-CM | POA: Diagnosis not present

## 2023-03-01 ENCOUNTER — Encounter: Payer: Self-pay | Admitting: Gastroenterology

## 2023-03-15 DIAGNOSIS — M5416 Radiculopathy, lumbar region: Secondary | ICD-10-CM | POA: Diagnosis not present

## 2023-03-15 DIAGNOSIS — Z6829 Body mass index (BMI) 29.0-29.9, adult: Secondary | ICD-10-CM | POA: Diagnosis not present

## 2023-03-30 DIAGNOSIS — Z6825 Body mass index (BMI) 25.0-25.9, adult: Secondary | ICD-10-CM | POA: Diagnosis not present

## 2023-03-30 DIAGNOSIS — E663 Overweight: Secondary | ICD-10-CM | POA: Diagnosis not present

## 2023-03-30 DIAGNOSIS — Z1331 Encounter for screening for depression: Secondary | ICD-10-CM | POA: Diagnosis not present

## 2023-03-30 DIAGNOSIS — F419 Anxiety disorder, unspecified: Secondary | ICD-10-CM | POA: Diagnosis not present

## 2023-03-30 DIAGNOSIS — Z0001 Encounter for general adult medical examination with abnormal findings: Secondary | ICD-10-CM | POA: Diagnosis not present

## 2023-03-30 DIAGNOSIS — H9193 Unspecified hearing loss, bilateral: Secondary | ICD-10-CM | POA: Diagnosis not present

## 2023-04-04 DIAGNOSIS — I7779 Dissection of other artery: Secondary | ICD-10-CM | POA: Diagnosis not present

## 2023-04-04 DIAGNOSIS — K5732 Diverticulitis of large intestine without perforation or abscess without bleeding: Secondary | ICD-10-CM | POA: Diagnosis not present

## 2023-04-04 DIAGNOSIS — C029 Malignant neoplasm of tongue, unspecified: Secondary | ICD-10-CM | POA: Diagnosis not present

## 2023-04-04 DIAGNOSIS — Z6830 Body mass index (BMI) 30.0-30.9, adult: Secondary | ICD-10-CM | POA: Diagnosis not present

## 2023-04-04 DIAGNOSIS — I10 Essential (primary) hypertension: Secondary | ICD-10-CM | POA: Diagnosis not present

## 2023-04-04 DIAGNOSIS — M5136 Other intervertebral disc degeneration, lumbar region: Secondary | ICD-10-CM | POA: Diagnosis not present

## 2023-04-04 DIAGNOSIS — G894 Chronic pain syndrome: Secondary | ICD-10-CM | POA: Diagnosis not present

## 2023-04-04 DIAGNOSIS — M5416 Radiculopathy, lumbar region: Secondary | ICD-10-CM | POA: Diagnosis not present

## 2023-04-04 DIAGNOSIS — E6609 Other obesity due to excess calories: Secondary | ICD-10-CM | POA: Diagnosis not present

## 2023-04-04 DIAGNOSIS — F419 Anxiety disorder, unspecified: Secondary | ICD-10-CM | POA: Diagnosis not present

## 2023-05-23 DIAGNOSIS — Z85819 Personal history of malignant neoplasm of unspecified site of lip, oral cavity, and pharynx: Secondary | ICD-10-CM | POA: Diagnosis not present

## 2023-07-18 DIAGNOSIS — Z6831 Body mass index (BMI) 31.0-31.9, adult: Secondary | ICD-10-CM | POA: Diagnosis not present

## 2023-07-18 DIAGNOSIS — E063 Autoimmune thyroiditis: Secondary | ICD-10-CM | POA: Diagnosis not present

## 2023-07-18 DIAGNOSIS — K5732 Diverticulitis of large intestine without perforation or abscess without bleeding: Secondary | ICD-10-CM | POA: Diagnosis not present

## 2023-07-18 DIAGNOSIS — Z1331 Encounter for screening for depression: Secondary | ICD-10-CM | POA: Diagnosis not present

## 2023-07-18 DIAGNOSIS — M5136 Other intervertebral disc degeneration, lumbar region: Secondary | ICD-10-CM | POA: Diagnosis not present

## 2023-07-18 DIAGNOSIS — F419 Anxiety disorder, unspecified: Secondary | ICD-10-CM | POA: Diagnosis not present

## 2023-07-18 DIAGNOSIS — I10 Essential (primary) hypertension: Secondary | ICD-10-CM | POA: Diagnosis not present

## 2023-07-18 DIAGNOSIS — G894 Chronic pain syndrome: Secondary | ICD-10-CM | POA: Diagnosis not present

## 2023-07-18 DIAGNOSIS — E6609 Other obesity due to excess calories: Secondary | ICD-10-CM | POA: Diagnosis not present

## 2023-07-18 DIAGNOSIS — Z0001 Encounter for general adult medical examination with abnormal findings: Secondary | ICD-10-CM | POA: Diagnosis not present

## 2023-07-27 DIAGNOSIS — D518 Other vitamin B12 deficiency anemias: Secondary | ICD-10-CM | POA: Diagnosis not present

## 2023-07-27 DIAGNOSIS — G9332 Myalgic encephalomyelitis/chronic fatigue syndrome: Secondary | ICD-10-CM | POA: Diagnosis not present

## 2023-07-27 DIAGNOSIS — Z0001 Encounter for general adult medical examination with abnormal findings: Secondary | ICD-10-CM | POA: Diagnosis not present

## 2023-07-27 DIAGNOSIS — E559 Vitamin D deficiency, unspecified: Secondary | ICD-10-CM | POA: Diagnosis not present

## 2023-07-27 DIAGNOSIS — E063 Autoimmune thyroiditis: Secondary | ICD-10-CM | POA: Diagnosis not present

## 2023-08-16 DIAGNOSIS — X32XXXA Exposure to sunlight, initial encounter: Secondary | ICD-10-CM | POA: Diagnosis not present

## 2023-08-16 DIAGNOSIS — L570 Actinic keratosis: Secondary | ICD-10-CM | POA: Diagnosis not present

## 2023-08-16 DIAGNOSIS — D225 Melanocytic nevi of trunk: Secondary | ICD-10-CM | POA: Diagnosis not present

## 2023-12-14 DIAGNOSIS — K5792 Diverticulitis of intestine, part unspecified, without perforation or abscess without bleeding: Secondary | ICD-10-CM | POA: Diagnosis not present

## 2023-12-14 DIAGNOSIS — M5134 Other intervertebral disc degeneration, thoracic region: Secondary | ICD-10-CM | POA: Diagnosis not present

## 2023-12-14 DIAGNOSIS — I10 Essential (primary) hypertension: Secondary | ICD-10-CM | POA: Diagnosis not present

## 2024-01-23 DIAGNOSIS — E6609 Other obesity due to excess calories: Secondary | ICD-10-CM | POA: Diagnosis not present

## 2024-01-23 DIAGNOSIS — Z6832 Body mass index (BMI) 32.0-32.9, adult: Secondary | ICD-10-CM | POA: Diagnosis not present

## 2024-01-23 DIAGNOSIS — F419 Anxiety disorder, unspecified: Secondary | ICD-10-CM | POA: Diagnosis not present

## 2024-03-06 ENCOUNTER — Other Ambulatory Visit (HOSPITAL_COMMUNITY): Payer: Self-pay | Admitting: Internal Medicine

## 2024-03-06 ENCOUNTER — Ambulatory Visit (HOSPITAL_COMMUNITY)
Admission: RE | Admit: 2024-03-06 | Discharge: 2024-03-06 | Disposition: A | Discharge status: Home / Self Care | Source: Ambulatory Visit | Attending: Internal Medicine | Admitting: Internal Medicine

## 2024-03-06 DIAGNOSIS — Z6832 Body mass index (BMI) 32.0-32.9, adult: Secondary | ICD-10-CM | POA: Diagnosis not present

## 2024-03-06 DIAGNOSIS — F419 Anxiety disorder, unspecified: Secondary | ICD-10-CM | POA: Diagnosis not present

## 2024-03-06 DIAGNOSIS — M25511 Pain in right shoulder: Secondary | ICD-10-CM | POA: Insufficient documentation

## 2024-03-06 DIAGNOSIS — M5134 Other intervertebral disc degeneration, thoracic region: Secondary | ICD-10-CM | POA: Diagnosis not present

## 2024-03-06 DIAGNOSIS — E6609 Other obesity due to excess calories: Secondary | ICD-10-CM | POA: Diagnosis not present

## 2024-03-06 DIAGNOSIS — M778 Other enthesopathies, not elsewhere classified: Secondary | ICD-10-CM | POA: Diagnosis not present

## 2024-03-06 DIAGNOSIS — M67911 Unspecified disorder of synovium and tendon, right shoulder: Secondary | ICD-10-CM | POA: Diagnosis not present

## 2024-05-06 DIAGNOSIS — Z0001 Encounter for general adult medical examination with abnormal findings: Secondary | ICD-10-CM | POA: Diagnosis not present

## 2024-05-06 DIAGNOSIS — Z6831 Body mass index (BMI) 31.0-31.9, adult: Secondary | ICD-10-CM | POA: Diagnosis not present

## 2024-05-06 DIAGNOSIS — I10 Essential (primary) hypertension: Secondary | ICD-10-CM | POA: Diagnosis not present

## 2024-05-06 DIAGNOSIS — E6609 Other obesity due to excess calories: Secondary | ICD-10-CM | POA: Diagnosis not present

## 2024-05-06 DIAGNOSIS — R319 Hematuria, unspecified: Secondary | ICD-10-CM | POA: Diagnosis not present

## 2024-05-06 DIAGNOSIS — Z9229 Personal history of other drug therapy: Secondary | ICD-10-CM | POA: Diagnosis not present

## 2024-05-08 DIAGNOSIS — R319 Hematuria, unspecified: Secondary | ICD-10-CM | POA: Diagnosis not present

## 2024-05-22 ENCOUNTER — Other Ambulatory Visit (HOSPITAL_COMMUNITY): Payer: Self-pay | Admitting: Internal Medicine

## 2024-05-22 DIAGNOSIS — I10 Essential (primary) hypertension: Secondary | ICD-10-CM

## 2024-05-23 ENCOUNTER — Ambulatory Visit (INDEPENDENT_AMBULATORY_CARE_PROVIDER_SITE_OTHER): Payer: HMO | Admitting: Otolaryngology

## 2024-05-23 ENCOUNTER — Encounter (INDEPENDENT_AMBULATORY_CARE_PROVIDER_SITE_OTHER): Payer: Self-pay | Admitting: Otolaryngology

## 2024-05-23 DIAGNOSIS — Z09 Encounter for follow-up examination after completed treatment for conditions other than malignant neoplasm: Secondary | ICD-10-CM

## 2024-05-23 DIAGNOSIS — Z85810 Personal history of malignant neoplasm of tongue: Secondary | ICD-10-CM

## 2024-05-23 DIAGNOSIS — Y842 Radiological procedure and radiotherapy as the cause of abnormal reaction of the patient, or of later complication, without mention of misadventure at the time of the procedure: Secondary | ICD-10-CM

## 2024-05-24 DIAGNOSIS — Z85810 Personal history of malignant neoplasm of tongue: Secondary | ICD-10-CM | POA: Insufficient documentation

## 2024-05-24 NOTE — Progress Notes (Signed)
 Patient ID: [name] Best, male   DOB: August 30, 1961, 63 y.o.   MRN: [id number]  Follow up: Tongue-base squamous cell carcinoma  HPI: The patient is a 63 year old male who returns today for his follow-up evaluation. The patient has a history of tongue-base squamous cell carcinoma and right neck metastasis. He underwent a biopsy of his tongue mass and excision of his right Level II neck mass. He was also treated with chemoradiation therapy. He completed his treatment in 02/2016. The patient returns today reporting no difficulty since his last visit. His oral intake is good.  He still has xerostomia from the radiation therapy.  He has no other complaint today.   Exam: General: Communicates without difficulty, well nourished, no acute distress. Head: Normocephalic, no evidence injury, no tenderness, facial buttresses intact without stepoff. Face/sinus: No tenderness to palpation and percussion. Facial movement is normal and symmetric. Eyes: PERRL, EOMI. No scleral icterus, conjunctivae clear. Neuro: CN II exam reveals vision grossly intact.  No nystagmus at any point of gaze. Ears: Auricles well formed without lesions.  Ear canals are intact without mass or lesion.  No erythema or edema is appreciated.  The TMs are intact without fluid. Nose: External evaluation reveals normal support and skin without lesions.  Dorsum is intact.  Anterior rhinoscopy reveals congested mucosa over anterior aspect of inferior turbinates and intact septum.  No purulence noted. Oral:  Oral cavity and oropharynx are intact, symmetric, without erythema or edema.  Mucosa is moist without lesions.  Indirect mirror examination shows no suspicious mass or lesion.  The tongue base is soft to palpation.  Neck: Full range of motion without pain.  There is no significant lymphadenopathy.  No masses palpable.  Thyroid  bed within normal limits to palpation.  Parotid glands and submandibular glands equal bilaterally without mass.  Trachea is  midline. Neuro:  CN 2-12 grossly intact.   Assessment:  The patient has a history of tongue-base squamous cell carcinoma with right neck metastasis. He completed his chemoradiation treatment in March of 2017. Currently there is no evidence of disease.   Plan: 1. The physical exam findings are reviewed with the patient.  2. No other intervention is needed at this time.  3. The patient will return for re-evaluation in 12 months.

## 2024-05-25 DIAGNOSIS — E782 Mixed hyperlipidemia: Secondary | ICD-10-CM | POA: Diagnosis not present

## 2024-05-25 DIAGNOSIS — E063 Autoimmune thyroiditis: Secondary | ICD-10-CM | POA: Diagnosis not present

## 2024-05-25 DIAGNOSIS — K219 Gastro-esophageal reflux disease without esophagitis: Secondary | ICD-10-CM | POA: Diagnosis not present

## 2024-05-25 DIAGNOSIS — I10 Essential (primary) hypertension: Secondary | ICD-10-CM | POA: Diagnosis not present

## 2024-06-12 DIAGNOSIS — R7309 Other abnormal glucose: Secondary | ICD-10-CM | POA: Diagnosis not present

## 2024-06-25 DIAGNOSIS — E063 Autoimmune thyroiditis: Secondary | ICD-10-CM | POA: Diagnosis not present

## 2024-06-25 DIAGNOSIS — E6609 Other obesity due to excess calories: Secondary | ICD-10-CM | POA: Diagnosis not present

## 2024-06-25 DIAGNOSIS — G894 Chronic pain syndrome: Secondary | ICD-10-CM | POA: Diagnosis not present

## 2024-06-25 DIAGNOSIS — I10 Essential (primary) hypertension: Secondary | ICD-10-CM | POA: Diagnosis not present

## 2024-06-25 DIAGNOSIS — F419 Anxiety disorder, unspecified: Secondary | ICD-10-CM | POA: Diagnosis not present

## 2024-06-25 DIAGNOSIS — M5136 Other intervertebral disc degeneration, lumbar region with discogenic back pain only: Secondary | ICD-10-CM | POA: Diagnosis not present

## 2024-06-25 DIAGNOSIS — Z6830 Body mass index (BMI) 30.0-30.9, adult: Secondary | ICD-10-CM | POA: Diagnosis not present

## 2024-06-25 DIAGNOSIS — M5134 Other intervertebral disc degeneration, thoracic region: Secondary | ICD-10-CM | POA: Diagnosis not present

## 2024-06-26 ENCOUNTER — Ambulatory Visit (HOSPITAL_COMMUNITY)
Admission: RE | Admit: 2024-06-26 | Discharge: 2024-06-26 | Disposition: A | Discharge status: Home / Self Care | Source: Ambulatory Visit | Attending: Internal Medicine | Admitting: Internal Medicine

## 2024-06-26 DIAGNOSIS — K4020 Bilateral inguinal hernia, without obstruction or gangrene, not specified as recurrent: Secondary | ICD-10-CM | POA: Diagnosis not present

## 2024-06-26 DIAGNOSIS — I10 Essential (primary) hypertension: Secondary | ICD-10-CM | POA: Insufficient documentation

## 2024-06-26 DIAGNOSIS — K5792 Diverticulitis of intestine, part unspecified, without perforation or abscess without bleeding: Secondary | ICD-10-CM | POA: Diagnosis not present

## 2024-06-26 DIAGNOSIS — R109 Unspecified abdominal pain: Secondary | ICD-10-CM | POA: Diagnosis not present

## 2024-06-26 DIAGNOSIS — N281 Cyst of kidney, acquired: Secondary | ICD-10-CM | POA: Diagnosis not present

## 2024-06-26 MED ORDER — IOHEXOL 350 MG/ML SOLN
100.0000 mL | Freq: Once | INTRAVENOUS | Status: AC | PRN
  Administered 2024-06-26: 100 mL via INTRAVENOUS

## 2024-10-03 DIAGNOSIS — R5382 Chronic fatigue, unspecified: Secondary | ICD-10-CM | POA: Diagnosis not present

## 2024-10-03 DIAGNOSIS — E785 Hyperlipidemia, unspecified: Secondary | ICD-10-CM | POA: Diagnosis not present

## 2024-10-03 DIAGNOSIS — E559 Vitamin D deficiency, unspecified: Secondary | ICD-10-CM | POA: Diagnosis not present

## 2024-10-03 DIAGNOSIS — I10 Essential (primary) hypertension: Secondary | ICD-10-CM | POA: Diagnosis not present

## 2024-10-03 DIAGNOSIS — F411 Generalized anxiety disorder: Secondary | ICD-10-CM | POA: Diagnosis not present

## 2024-10-03 DIAGNOSIS — Z125 Encounter for screening for malignant neoplasm of prostate: Secondary | ICD-10-CM | POA: Diagnosis not present

## 2024-10-03 DIAGNOSIS — E538 Deficiency of other specified B group vitamins: Secondary | ICD-10-CM | POA: Diagnosis not present

## 2024-10-03 DIAGNOSIS — Z0000 Encounter for general adult medical examination without abnormal findings: Secondary | ICD-10-CM | POA: Diagnosis not present

## 2024-10-03 DIAGNOSIS — Z85810 Personal history of malignant neoplasm of tongue: Secondary | ICD-10-CM | POA: Diagnosis not present

## 2024-10-03 DIAGNOSIS — K219 Gastro-esophageal reflux disease without esophagitis: Secondary | ICD-10-CM | POA: Diagnosis not present

## 2024-10-03 DIAGNOSIS — E039 Hypothyroidism, unspecified: Secondary | ICD-10-CM | POA: Diagnosis not present

## 2024-10-03 DIAGNOSIS — H6120 Impacted cerumen, unspecified ear: Secondary | ICD-10-CM | POA: Diagnosis not present

## 2024-10-03 DIAGNOSIS — R7309 Other abnormal glucose: Secondary | ICD-10-CM | POA: Diagnosis not present
